# Patient Record
Sex: Female | Born: 1950 | Race: White | Hispanic: No | Marital: Married | Smoking: Former smoker
Health system: Southern US, Community
[De-identification: ages and names within clinical notes are randomized; demographics above are authoritative.]

## PROBLEM LIST (undated history)

## (undated) DIAGNOSIS — Z9841 Cataract extraction status, right eye: Secondary | ICD-10-CM

## (undated) DIAGNOSIS — R0600 Dyspnea, unspecified: Secondary | ICD-10-CM

## (undated) DIAGNOSIS — T8859XA Other complications of anesthesia, initial encounter: Secondary | ICD-10-CM

## (undated) DIAGNOSIS — Z9889 Other specified postprocedural states: Secondary | ICD-10-CM

## (undated) DIAGNOSIS — K469 Unspecified abdominal hernia without obstruction or gangrene: Secondary | ICD-10-CM

## (undated) DIAGNOSIS — R7303 Prediabetes: Secondary | ICD-10-CM

## (undated) DIAGNOSIS — C4490 Unspecified malignant neoplasm of skin, unspecified: Secondary | ICD-10-CM

## (undated) DIAGNOSIS — R1310 Dysphagia, unspecified: Secondary | ICD-10-CM

## (undated) DIAGNOSIS — S3669XA Other injury of rectum, initial encounter: Secondary | ICD-10-CM

## (undated) DIAGNOSIS — K219 Gastro-esophageal reflux disease without esophagitis: Secondary | ICD-10-CM

## (undated) DIAGNOSIS — R079 Chest pain, unspecified: Secondary | ICD-10-CM

## (undated) DIAGNOSIS — I8390 Asymptomatic varicose veins of unspecified lower extremity: Secondary | ICD-10-CM

## (undated) DIAGNOSIS — D3501 Benign neoplasm of right adrenal gland: Secondary | ICD-10-CM

## (undated) DIAGNOSIS — J45909 Unspecified asthma, uncomplicated: Secondary | ICD-10-CM

## (undated) DIAGNOSIS — J4489 Other specified chronic obstructive pulmonary disease: Secondary | ICD-10-CM

## (undated) DIAGNOSIS — Z8489 Family history of other specified conditions: Secondary | ICD-10-CM

## (undated) DIAGNOSIS — D649 Anemia, unspecified: Secondary | ICD-10-CM

## (undated) DIAGNOSIS — J449 Chronic obstructive pulmonary disease, unspecified: Secondary | ICD-10-CM

## (undated) DIAGNOSIS — R918 Other nonspecific abnormal finding of lung field: Secondary | ICD-10-CM

## (undated) DIAGNOSIS — E785 Hyperlipidemia, unspecified: Secondary | ICD-10-CM

## (undated) DIAGNOSIS — I10 Essential (primary) hypertension: Secondary | ICD-10-CM

## (undated) DIAGNOSIS — H25019 Cortical age-related cataract, unspecified eye: Secondary | ICD-10-CM

## (undated) DIAGNOSIS — N816 Rectocele: Secondary | ICD-10-CM

## (undated) DIAGNOSIS — I5032 Chronic diastolic (congestive) heart failure: Secondary | ICD-10-CM

## (undated) DIAGNOSIS — R011 Cardiac murmur, unspecified: Secondary | ICD-10-CM

## (undated) DIAGNOSIS — G4733 Obstructive sleep apnea (adult) (pediatric): Secondary | ICD-10-CM

## (undated) DIAGNOSIS — Z87891 Personal history of nicotine dependence: Secondary | ICD-10-CM

## (undated) DIAGNOSIS — T7840XA Allergy, unspecified, initial encounter: Secondary | ICD-10-CM

## (undated) DIAGNOSIS — M1990 Unspecified osteoarthritis, unspecified site: Secondary | ICD-10-CM

## (undated) DIAGNOSIS — R0609 Other forms of dyspnea: Secondary | ICD-10-CM

## (undated) DIAGNOSIS — I700 Atherosclerosis of aorta: Secondary | ICD-10-CM

## (undated) DIAGNOSIS — K649 Unspecified hemorrhoids: Secondary | ICD-10-CM

## (undated) HISTORY — PX: SPLENECTOMY: SUR1306

## (undated) HISTORY — PX: ARTHOSCOPIC ROTAOR CUFF REPAIR: SHX5002

## (undated) HISTORY — PX: HEMORRHOID SURGERY: SHX153

## (undated) HISTORY — PX: TUBAL LIGATION: SHX77

## (undated) HISTORY — PX: BLADDER REPAIR: SHX6721

## (undated) HISTORY — PX: CATARACT EXTRACTION: SUR2

## (undated) HISTORY — PX: HERNIA REPAIR: SHX51

## (undated) HISTORY — PX: ABDOMINAL HYSTERECTOMY: SHX81

## (undated) HISTORY — PX: LAPAROSCOPIC SPLENECTOMY: SUR796

## (undated) HISTORY — PX: SKIN CANCER EXCISION: SHX779

## (undated) HISTORY — DX: Chest pain, unspecified: R07.9

## (undated) HISTORY — PX: CHOLECYSTECTOMY: SHX55

## (undated) HISTORY — DX: Chronic diastolic (congestive) heart failure: I50.32

---

## 1981-12-09 DIAGNOSIS — Z862 Personal history of diseases of the blood and blood-forming organs and certain disorders involving the immune mechanism: Secondary | ICD-10-CM

## 1981-12-09 HISTORY — DX: Personal history of diseases of the blood and blood-forming organs and certain disorders involving the immune mechanism: Z86.2

## 2006-05-19 ENCOUNTER — Ambulatory Visit: Payer: Self-pay | Admitting: Unknown Physician Specialty

## 2006-11-14 ENCOUNTER — Ambulatory Visit: Payer: Self-pay | Admitting: Unknown Physician Specialty

## 2006-11-26 ENCOUNTER — Ambulatory Visit: Payer: Self-pay | Admitting: Unknown Physician Specialty

## 2006-12-08 ENCOUNTER — Ambulatory Visit: Payer: Self-pay | Admitting: Unknown Physician Specialty

## 2006-12-19 ENCOUNTER — Ambulatory Visit: Payer: Self-pay | Admitting: Vascular Surgery

## 2006-12-29 ENCOUNTER — Other Ambulatory Visit: Payer: Self-pay

## 2007-01-02 ENCOUNTER — Ambulatory Visit: Payer: Self-pay | Admitting: Vascular Surgery

## 2007-11-16 IMAGING — NM NUCLEAR MEDICINE HEPATOHBILIARY INCLUDE GB
2 series · 12 of 12 positions shown · non-contrast
Comparison: none

REASON FOR EXAM: Abdominl pain
COMMENTS:

[Series 1: gallbladder dynamic · 4.80mm/px · 6 of 60 frames shown]
[frame 6/60]
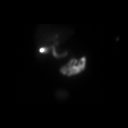
[frame 16/60]
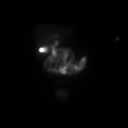
[frame 26/60]
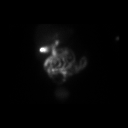
[frame 36/60]
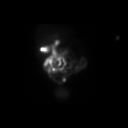
[frame 46/60]
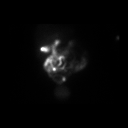
[frame 56/60]
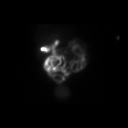

[Series 1: gallbladder dynamic (results) · 4.80mm/px · 6 of 60 frames shown]
[frame 6/60]
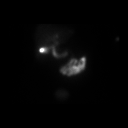
[frame 16/60]
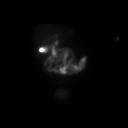
[frame 26/60]
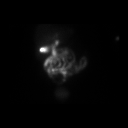
[frame 36/60]
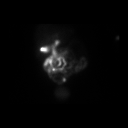
[frame 46/60]
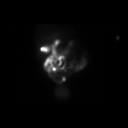
[frame 56/60]
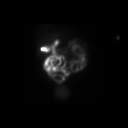

[12 of 12 positions shown; findings below may reference images not displayed]

PROCEDURE:     NM  - NM HEPATO WITH GB EJECT FRACTION  - December 19, 2006  [DATE]

RESULT:     Following intravenous administration of 7.63 millicuries of
technetium-99m Choletec, there is noted prompt visualization of tracer
activity in the liver at three minutes.  Tracer activity is visualized in
the gallbladder and common duct at 40 minutes.

The gallbladder ejection fraction at 30 minutes measures 29% which is in the
hypokinetic range.
IMPRESSION: Normal hepatobiliary scan.

The gallbladder ejection fraction at 30 minutes measures 29% which is in the
hypokinetic range.

## 2007-11-26 IMAGING — CR DG CHEST 2V
1 series · 2 of 2 positions shown · non-contrast
Comparison: none

REASON FOR EXAM: COPD
COMMENTS:

[Series 1: view not recorded · 0.17mm/px · 2 of 2 slices shown]
[im 1/2]
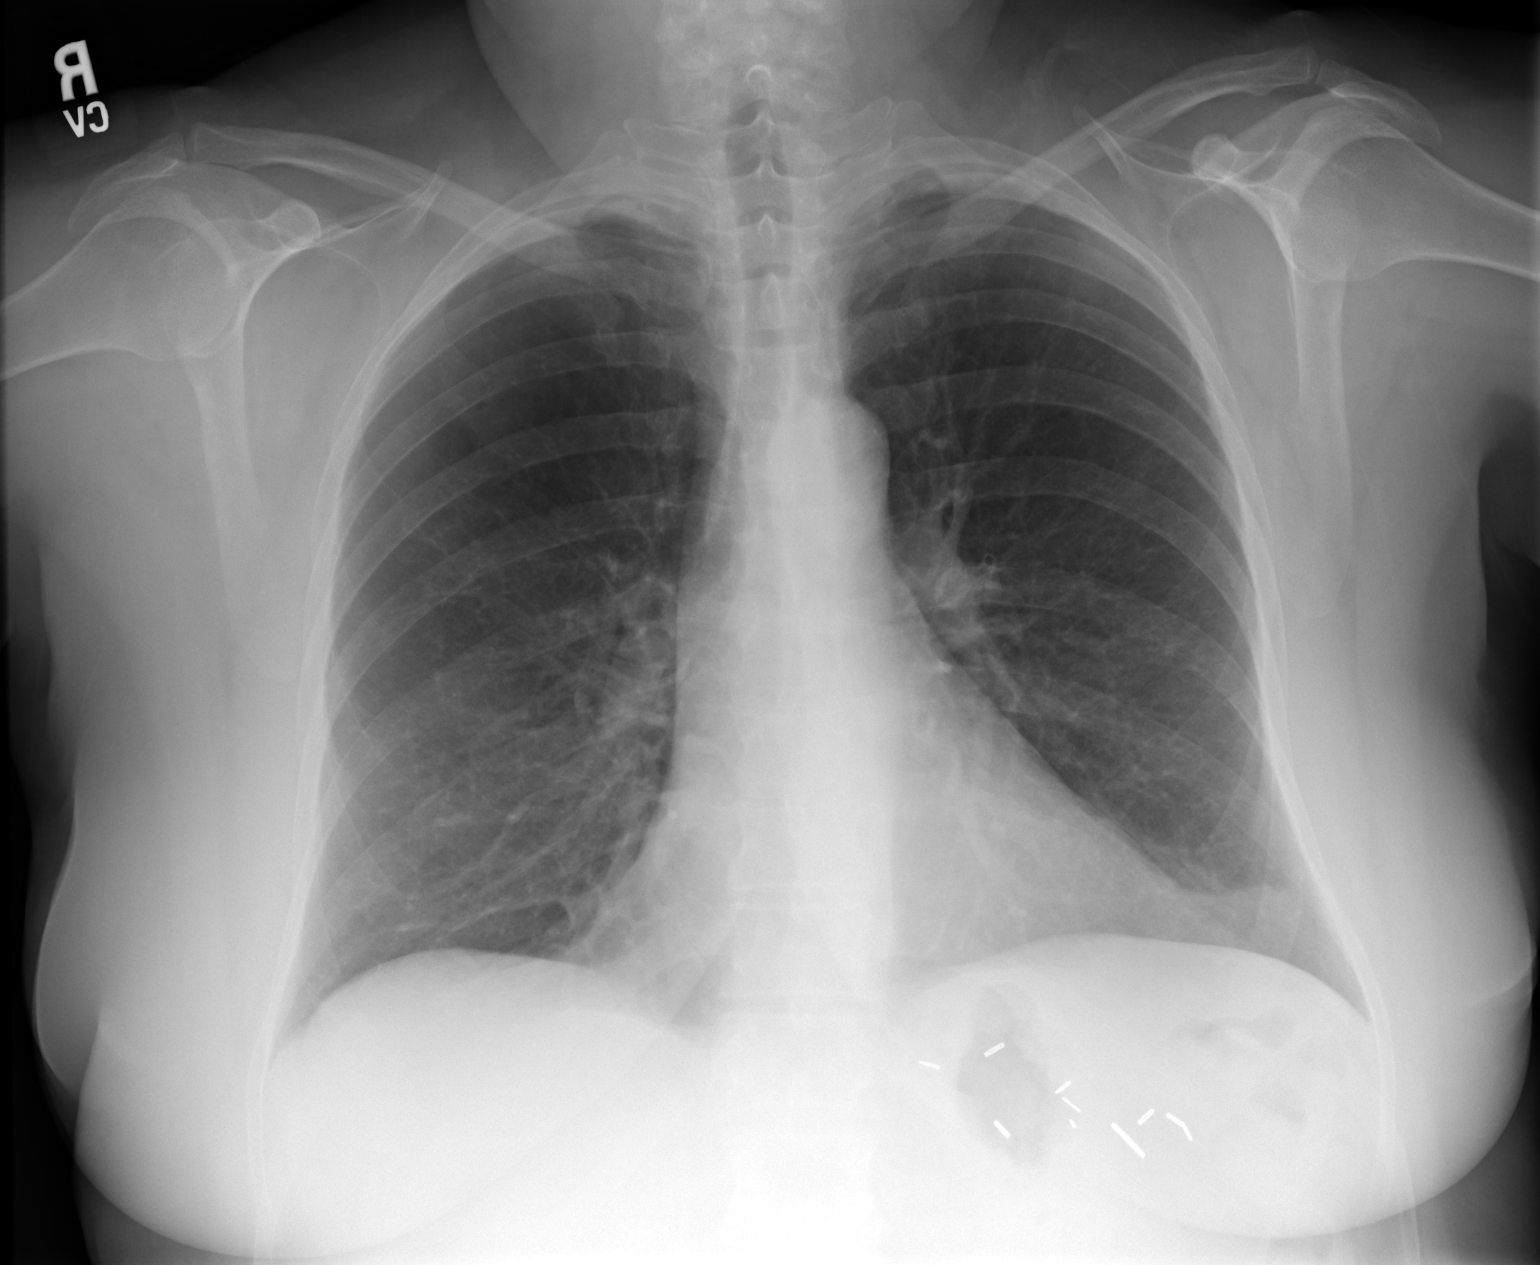
[im 2/2]
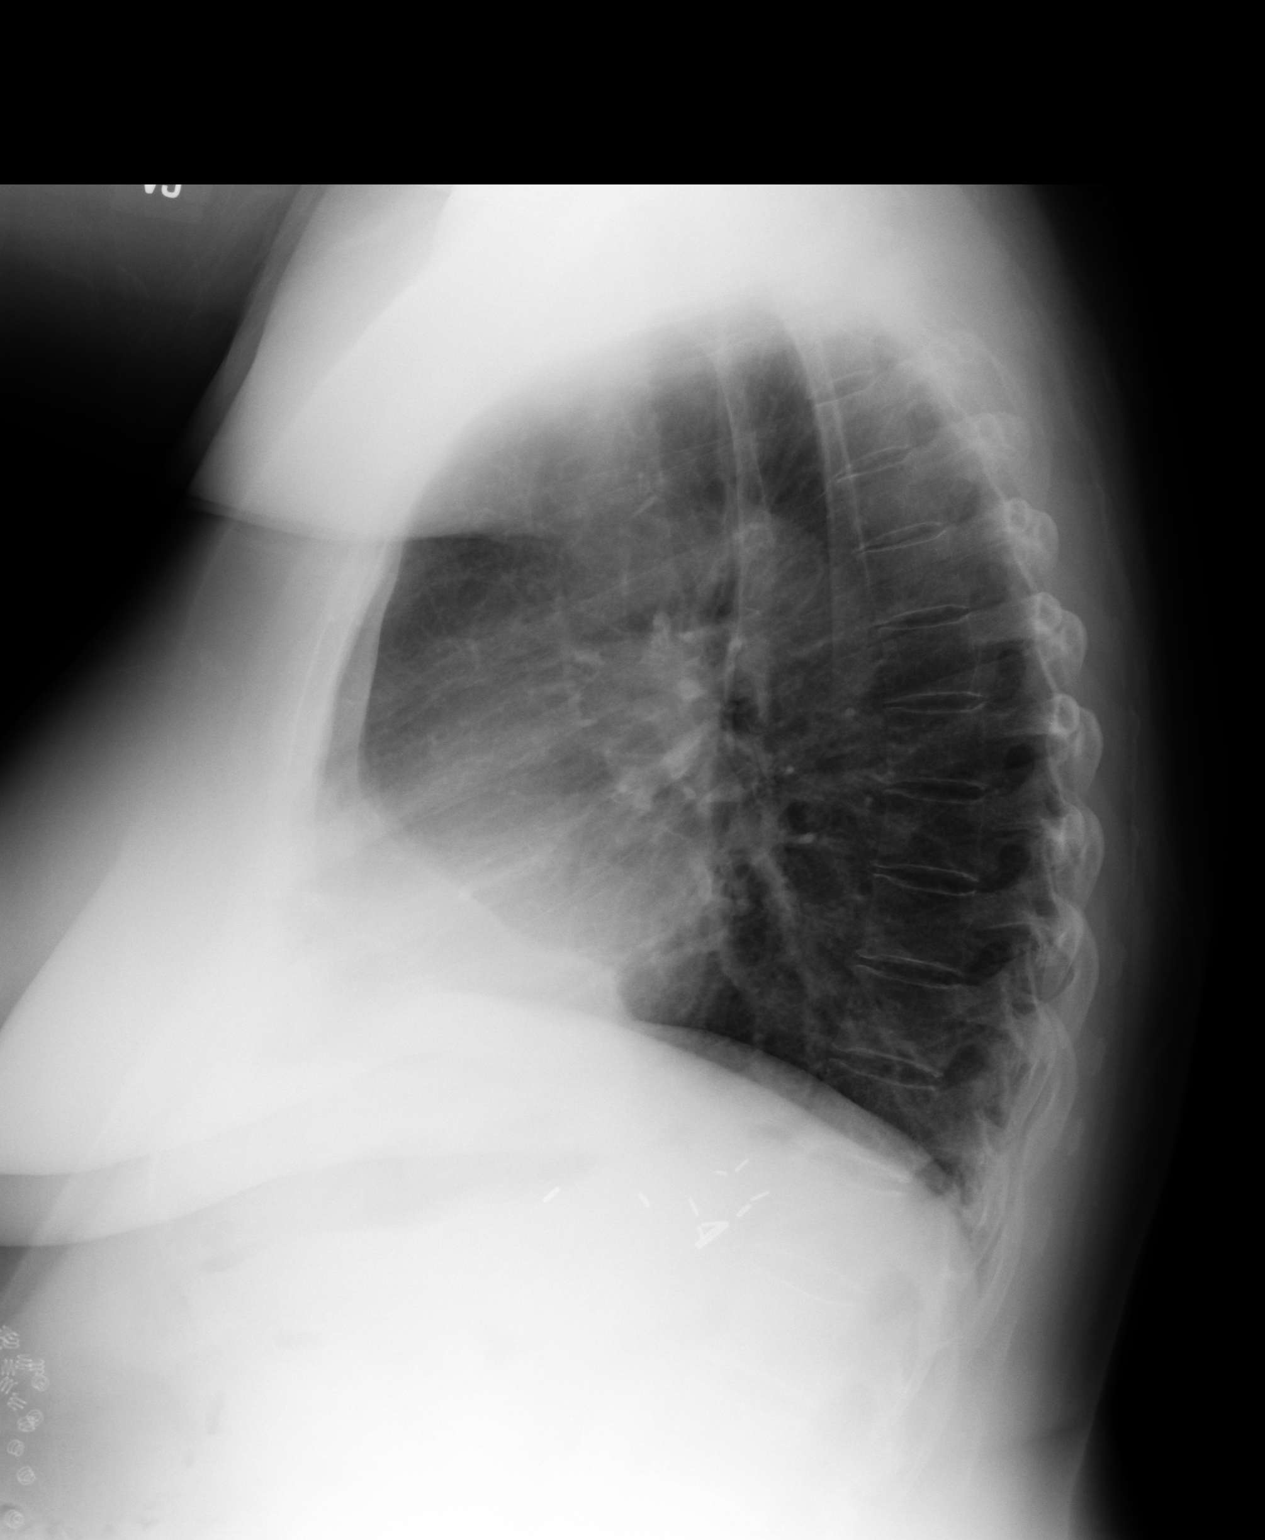

[2 of 2 positions shown; findings below may reference images not displayed]

PROCEDURE:     DXR - DXR CHEST PA (OR AP) AND LATERAL  - December 29, 2006 [DATE]

RESULT:     The lungs are mildly hyperinflated. There is no focal
infiltrate.  The heart is not enlarged.  The pulmonary vascularity is not
engorged.  There is no pleural effusion.  There are surgical clips in the
region of the GE junction.
IMPRESSION: I do not see evidence of pneumonia or congestive heart failure.  There are
findings which likely reflect an element of COPD or reactive airway disease.

## 2008-02-16 ENCOUNTER — Ambulatory Visit: Payer: Self-pay | Admitting: Unknown Physician Specialty

## 2008-09-30 ENCOUNTER — Ambulatory Visit: Payer: Self-pay | Admitting: Specialist

## 2008-10-11 ENCOUNTER — Ambulatory Visit: Payer: Self-pay | Admitting: Obstetrics and Gynecology

## 2008-10-17 ENCOUNTER — Observation Stay: Payer: Self-pay | Admitting: Vascular Surgery

## 2009-06-19 ENCOUNTER — Ambulatory Visit: Payer: Self-pay | Admitting: Specialist

## 2010-02-21 ENCOUNTER — Ambulatory Visit: Payer: Self-pay | Admitting: Specialist

## 2010-11-13 ENCOUNTER — Ambulatory Visit: Payer: Self-pay | Admitting: General Surgery

## 2010-11-16 ENCOUNTER — Ambulatory Visit: Payer: Self-pay | Admitting: General Surgery

## 2010-11-26 ENCOUNTER — Ambulatory Visit: Payer: Self-pay | Admitting: General Surgery

## 2011-08-30 ENCOUNTER — Emergency Department: Payer: Self-pay | Admitting: Emergency Medicine

## 2014-04-14 DIAGNOSIS — I10 Essential (primary) hypertension: Secondary | ICD-10-CM | POA: Insufficient documentation

## 2014-04-14 DIAGNOSIS — K219 Gastro-esophageal reflux disease without esophagitis: Secondary | ICD-10-CM | POA: Insufficient documentation

## 2014-04-14 DIAGNOSIS — J309 Allergic rhinitis, unspecified: Secondary | ICD-10-CM | POA: Insufficient documentation

## 2014-04-14 DIAGNOSIS — E785 Hyperlipidemia, unspecified: Secondary | ICD-10-CM | POA: Insufficient documentation

## 2015-04-27 DIAGNOSIS — Z862 Personal history of diseases of the blood and blood-forming organs and certain disorders involving the immune mechanism: Secondary | ICD-10-CM | POA: Insufficient documentation

## 2016-07-18 ENCOUNTER — Encounter: Payer: Self-pay | Admitting: *Deleted

## 2016-07-22 ENCOUNTER — Ambulatory Visit
Admission: RE | Admit: 2016-07-22 | Discharge: 2016-07-22 | Disposition: A | Discharge status: Home / Self Care | Payer: BLUE CROSS/BLUE SHIELD | Source: Ambulatory Visit | Attending: Unknown Physician Specialty | Admitting: Unknown Physician Specialty

## 2016-07-22 ENCOUNTER — Encounter
Admission: RE | Disposition: A | Discharge status: Home / Self Care | Payer: Self-pay | Source: Ambulatory Visit | Attending: Unknown Physician Specialty

## 2016-07-22 ENCOUNTER — Ambulatory Visit: Payer: BLUE CROSS/BLUE SHIELD | Admitting: Anesthesiology

## 2016-07-22 ENCOUNTER — Encounter: Payer: Self-pay | Admitting: Anesthesiology

## 2016-07-22 DIAGNOSIS — K5730 Diverticulosis of large intestine without perforation or abscess without bleeding: Secondary | ICD-10-CM | POA: Diagnosis not present

## 2016-07-22 DIAGNOSIS — M1990 Unspecified osteoarthritis, unspecified site: Secondary | ICD-10-CM | POA: Diagnosis not present

## 2016-07-22 DIAGNOSIS — K219 Gastro-esophageal reflux disease without esophagitis: Secondary | ICD-10-CM | POA: Diagnosis present

## 2016-07-22 DIAGNOSIS — Z9049 Acquired absence of other specified parts of digestive tract: Secondary | ICD-10-CM | POA: Diagnosis not present

## 2016-07-22 DIAGNOSIS — K640 First degree hemorrhoids: Secondary | ICD-10-CM | POA: Diagnosis not present

## 2016-07-22 DIAGNOSIS — I10 Essential (primary) hypertension: Secondary | ICD-10-CM | POA: Diagnosis not present

## 2016-07-22 DIAGNOSIS — K2950 Unspecified chronic gastritis without bleeding: Secondary | ICD-10-CM | POA: Diagnosis not present

## 2016-07-22 DIAGNOSIS — E785 Hyperlipidemia, unspecified: Secondary | ICD-10-CM | POA: Diagnosis not present

## 2016-07-22 DIAGNOSIS — Z79899 Other long term (current) drug therapy: Secondary | ICD-10-CM | POA: Insufficient documentation

## 2016-07-22 DIAGNOSIS — Z90710 Acquired absence of both cervix and uterus: Secondary | ICD-10-CM | POA: Insufficient documentation

## 2016-07-22 DIAGNOSIS — Z9889 Other specified postprocedural states: Secondary | ICD-10-CM | POA: Diagnosis not present

## 2016-07-22 DIAGNOSIS — K210 Gastro-esophageal reflux disease with esophagitis: Secondary | ICD-10-CM | POA: Insufficient documentation

## 2016-07-22 DIAGNOSIS — K2960 Other gastritis without bleeding: Secondary | ICD-10-CM | POA: Insufficient documentation

## 2016-07-22 DIAGNOSIS — R1310 Dysphagia, unspecified: Secondary | ICD-10-CM | POA: Insufficient documentation

## 2016-07-22 DIAGNOSIS — Z1211 Encounter for screening for malignant neoplasm of colon: Secondary | ICD-10-CM | POA: Diagnosis present

## 2016-07-22 DIAGNOSIS — Z7951 Long term (current) use of inhaled steroids: Secondary | ICD-10-CM | POA: Diagnosis not present

## 2016-07-22 DIAGNOSIS — Z87891 Personal history of nicotine dependence: Secondary | ICD-10-CM | POA: Insufficient documentation

## 2016-07-22 DIAGNOSIS — J449 Chronic obstructive pulmonary disease, unspecified: Secondary | ICD-10-CM | POA: Diagnosis not present

## 2016-07-22 HISTORY — DX: Hyperlipidemia, unspecified: E78.5

## 2016-07-22 HISTORY — DX: Essential (primary) hypertension: I10

## 2016-07-22 HISTORY — PX: ESOPHAGOGASTRODUODENOSCOPY (EGD) WITH PROPOFOL: SHX5813

## 2016-07-22 HISTORY — DX: Chronic obstructive pulmonary disease, unspecified: J44.9

## 2016-07-22 HISTORY — PX: COLONOSCOPY WITH PROPOFOL: SHX5780

## 2016-07-22 HISTORY — DX: Allergy, unspecified, initial encounter: T78.40XA

## 2016-07-22 HISTORY — DX: Dysphagia, unspecified: R13.10

## 2016-07-22 HISTORY — DX: Unspecified osteoarthritis, unspecified site: M19.90

## 2016-07-22 HISTORY — DX: Unspecified abdominal hernia without obstruction or gangrene: K46.9

## 2016-07-22 HISTORY — DX: Unspecified hemorrhoids: K64.9

## 2016-07-22 HISTORY — DX: Cortical age-related cataract, unspecified eye: H25.019

## 2016-07-22 HISTORY — DX: Unspecified asthma, uncomplicated: J45.909

## 2016-07-22 HISTORY — DX: Rectocele: N81.6

## 2016-07-22 HISTORY — DX: Gastro-esophageal reflux disease without esophagitis: K21.9

## 2016-07-22 HISTORY — DX: Other injury of rectum, initial encounter: S36.69XA

## 2016-07-22 HISTORY — DX: Anemia, unspecified: D64.9

## 2016-07-22 SURGERY — COLONOSCOPY WITH PROPOFOL
Anesthesia: General

## 2016-07-22 MED ORDER — PROPOFOL 10 MG/ML IV BOLUS
INTRAVENOUS | Status: DC | PRN
  Administered 2016-07-22: 50 mg via INTRAVENOUS

## 2016-07-22 MED ORDER — EPHEDRINE SULFATE 50 MG/ML IJ SOLN
INTRAMUSCULAR | Status: DC | PRN
  Administered 2016-07-22 (×2): 10 mg via INTRAVENOUS

## 2016-07-22 MED ORDER — PROPOFOL 500 MG/50ML IV EMUL
INTRAVENOUS | Status: DC | PRN
  Administered 2016-07-22: 100 ug/kg/min via INTRAVENOUS

## 2016-07-22 MED ORDER — LIDOCAINE HCL (PF) 2 % IJ SOLN
INTRAMUSCULAR | Status: DC | PRN
  Administered 2016-07-22: 50 mg

## 2016-07-22 MED ORDER — SODIUM CHLORIDE 0.9 % IV SOLN: INTRAVENOUS | Status: DC

## 2016-07-22 MED ORDER — GLYCOPYRROLATE 0.2 MG/ML IJ SOLN
INTRAMUSCULAR | Status: DC | PRN
  Administered 2016-07-22: 0.2 mg via INTRAVENOUS

## 2016-07-22 MED ORDER — SODIUM CHLORIDE 0.9 % IV SOLN
INTRAVENOUS | Status: DC
  Administered 2016-07-22: 1000 mL via INTRAVENOUS

## 2016-07-22 MED ORDER — MIDAZOLAM HCL 5 MG/5ML IJ SOLN
INTRAMUSCULAR | Status: DC | PRN
  Administered 2016-07-22 (×3): 1 mg via INTRAVENOUS

## 2016-07-22 MED ORDER — ONDANSETRON HCL 4 MG/2ML IJ SOLN: 4.0000 mg | Freq: Once | INTRAMUSCULAR | Status: DC | PRN

## 2016-07-22 MED ORDER — FENTANYL CITRATE (PF) 100 MCG/2ML IJ SOLN
25.0000 ug | INTRAMUSCULAR | Status: DC | PRN
  Administered 2016-07-22: 50 ug via INTRAVENOUS

## 2016-07-22 NOTE — Op Note (Signed)
[facility] Gastroenterology Patient Name: Rhonda Oconnell Procedure Date: 07/22/2016 9:04 AM MRN: [id number] Account #: [id number] Date of Birth: Sep 28, 1951 Admit Type: Outpatient Age: [age] [facility] Gender: Female Note Status: Finalized Procedure:            Colonoscopy Indications:          Screening for colorectal malignant neoplasm Providers:            Scot Junobert T. Elliott, MD Referring MD:         Hassell HalimMarcus E. Babaoff MD (Referring MD) Medicines:            Propofol per Anesthesia Complications:        No immediate complications. Procedure:            Pre-Anesthesia Assessment:                       - After reviewing the risks and benefits, the patient                        was deemed in satisfactory condition to undergo the                        procedure.                       After obtaining informed consent, the colonoscope was                        passed under direct vision. Throughout the procedure,                        the patient's blood pressure, pulse, and oxygen                        saturations were monitored continuously. The                        Colonoscope was introduced through the anus and                        advanced to the the cecum, identified by appendiceal                        orifice and ileocecal valve. The colonoscopy was                        somewhat difficult due to restricted mobility of the                        colon. Successful completion of the procedure was aided                        by straightening and shortening the scope to obtain                        bowel loop reduction. The patient tolerated the                        procedure well. The quality of the bowel preparation  was good. Findings:      Many small-mouthed diverticula were found in the sigmoid colon.      Internal hemorrhoids were found during endoscopy. The hemorrhoids were       small and Grade I (internal  hemorrhoids that do not prolapse).      The exam was otherwise without abnormality. Impression:           - Diverticulosis in the sigmoid colon.                       - Internal hemorrhoids.                       - The examination was otherwise normal.                       - No specimens collected. Recommendation:       - Repeat colonoscopy in 10 years for screening purposes. Procedure Code(s):    --- Professional ---                       [phone], Colonoscopy, flexible; diagnostic, including                        collection of specimen(s) by brushing or washing, when                        performed (separate procedure) Diagnosis Code(s):    --- Professional ---                       Z12.11, Encounter for screening for malignant neoplasm                        of colon                       K64.0, First degree hemorrhoids                       K57.30, Diverticulosis of large intestine without                        perforation or abscess without bleeding CPT copyright 2016 [employer]. All rights reserved. The codes documented in this report are preliminary and upon coder review may  be revised to meet current compliance requirements. Scot Junobert T Elliott, MD 07/22/2016 10:03:55 AM This report has been signed electronically. Number of Addenda: 0 Note Initiated On: 07/22/2016 9:04 AM Scope Withdrawal Time: 0 hours 7 minutes 3 seconds  Total Procedure Duration: 0 hours 16 minutes 23 seconds       [facility]

## 2016-07-22 NOTE — H&P (Signed)
Primary Care Physician:  Rozanna BoxBABAOFF, MARC E, MD Primary Gastroenterologist:  Dr. Mechele CollinElliott  Pre-Procedure History & Physical: HPI:  Rhonda Oconnell is a 65 y.o. female is here for an endoscopy and colonoscopy.   Past Medical History:  Diagnosis Date  . Abdominal hernia   . Allergic state   . Anemia   . Arthritis   . Asthma   . Cataract cortical, senile   . COPD (chronic obstructive pulmonary disease) (HCC)   . Dysphagia   . GERD (gastroesophageal reflux disease)   . Hemorrhoids   . Hyperlipidemia   . Hypertension   . Rectal tear (HCC)   . Rectocele     Past Surgical History:  Procedure Laterality Date  . ABDOMINAL HYSTERECTOMY    . ARTHOSCOPIC ROTAOR CUFF REPAIR    . BLADDER REPAIR    . CATARACT EXTRACTION    . CHOLECYSTECTOMY    . HEMORRHOID SURGERY    . HERNIA REPAIR    . LAPAROSCOPIC SPLENECTOMY    . TUBAL LIGATION      Prior to Admission medications   Medication Sig Start Date End Date Taking? Authorizing Provider  acetaminophen (TYLENOL) 500 MG tablet Take 500 mg by mouth every 6 (six) hours as needed.   Yes Historical Provider, MD  albuterol (PROVENTIL HFA;VENTOLIN HFA) 108 (90 Base) MCG/ACT inhaler Inhale 2 puffs into the lungs every 6 (six) hours as needed for wheezing or shortness of breath.   Yes Historical Provider, MD  atorvastatin (LIPITOR) 20 MG tablet Take 20 mg by mouth daily.   Yes Historical Provider, MD  beclomethasone (QVAR) 80 MCG/ACT inhaler Inhale 1 puff into the lungs 2 (two) times daily.   Yes Historical Provider, MD  Calcium Carbonate-Vitamin D (OSCAL 500/200 D-3 PO) Take 1 tablet by mouth 2 (two) times daily.   Yes Historical Provider, MD  hydrochlorothiazide (HYDRODIURIL) 12.5 MG tablet Take 12.5 mg by mouth daily.   Yes Historical Provider, MD  losartan (COZAAR) 100 MG tablet Take 100 mg by mouth daily.   Yes Historical Provider, MD  montelukast (SINGULAIR) 10 MG tablet Take 10 mg by mouth at bedtime.   Yes Historical Provider, MD  Multiple  Vitamin (MULTIVITAMIN) tablet Take 1 tablet by mouth daily.   Yes Historical Provider, MD  omeprazole (PRILOSEC) 20 MG capsule Take 20 mg by mouth 2 (two) times daily before a meal.   Yes Historical Provider, MD    Allergies as of 06/19/2016  . (Not on File)    History reviewed. No pertinent family history.  Social History   Social History  . Marital status: Married    Spouse name: N/A  . Number of children: N/A  . Years of education: N/A   Occupational History  . Not on file.   Social History Main Topics  . Smoking status: Former Smoker    Quit date: 08/15/1993  . Smokeless tobacco: Never Used  . Alcohol use No  . Drug use: No  . Sexual activity: Not on file   Other Topics Concern  . Not on file   Social History Narrative  . No narrative on file    Review of Systems: See HPI, otherwise negative ROS  Physical Exam: BP (!) 141/78   Pulse 75   Temp 97.6 F (36.4 C) (Tympanic)   Resp 18   Ht 5\' 3"  (1.6 m)   Wt 99.8 kg (220 lb)   SpO2 99%   BMI 38.97 kg/m  General:   Alert,  pleasant and  cooperative in NAD Head:  Normocephalic and atraumatic. Neck:  Supple; no masses or thyromegaly. Lungs:  Clear throughout to auscultation.    Heart:  Regular rate and rhythm. Abdomen:  Soft, nontender and nondistended. Normal bowel sounds, without guarding, and without rebound.   Neurologic:  Alert and  oriented x4;  grossly normal neurologically.  Impression/Plan: Rhonda Oconnell is here for an endoscopy and colonoscopy to be performed for dysphagia and GERD and colon screening.  Risks, benefits, limitations, and alternatives regarding  endoscopy and colonoscopy have been reviewed with the patient.  Questions have been answered.  All parties agreeable.   Lynnae PrudeELLIOTT, ROBERT, MD  07/22/2016, 9:21 AM

## 2016-07-22 NOTE — Op Note (Signed)
[facility] Gastroenterology Patient Name: Rhonda Oconnell Procedure Date: 07/22/2016 9:04 AM MRN: [id number] Account #: [id number] Date of Birth: 09-23-1951 Admit Type: Outpatient Age: [age] [facility] Gender: Female Note Status: Finalized Procedure:            Upper GI endoscopy Indications:          Dysphagia, Heartburn Providers:            Scot Junobert T. Tammela Bales, MD Referring MD:         Hassell HalimMarcus E. Babaoff MD (Referring MD) Medicines:            Propofol per Anesthesia Complications:        No immediate complications. Procedure:            Pre-Anesthesia Assessment:                       - After reviewing the risks and benefits, the patient                        was deemed in satisfactory condition to undergo the                        procedure.                       After obtaining informed consent, the endoscope was                        passed under direct vision. Throughout the procedure,                        the patient's blood pressure, pulse, and oxygen                        saturations were monitored continuously. The Endoscope                        was introduced through the mouth, and advanced to the                        second part of duodenum. The upper GI endoscopy was                        accomplished without difficulty. The patient tolerated                        the procedure well. Findings:      LA Grade A (one or more mucosal breaks less than 5 mm, not extending       between tops of 2 mucosal folds) esophagitis with no bleeding was found       39 cm from the incisors. Biopsies were taken with a cold forceps for       histology. A guidewire was placed and the scope was withdrawn. Dilation       was performed with a Savary dilator with mild resistance at 16 mm and 17       mm.      Diffuse and patchy mild inflammation characterized by erythema and       granularity was found in the gastric antrum. Biopsies were taken with a     cold forceps for  histology. Biopsies were taken with a cold forceps for       Helicobacter pylori testing.      The examined duodenum was normal. Impression:           - LA Grade A reflux esophagitis. Rule out Barrett's                        esophagus. Biopsied. Dilated.                       - Gastritis. Biopsied.                       - Normal examined duodenum. Recommendation:       - Await pathology results.                       - Perform a colonoscopy as previously scheduled. Scot Junobert T Taevion Sikora, MD 07/22/2016 9:42:43 AM This report has been signed electronically. Number of Addenda: 0 Note Initiated On: 07/22/2016 9:04 AM      [facility]

## 2016-07-22 NOTE — Anesthesia Postprocedure Evaluation (Signed)
Anesthesia Post Note  Patient: Wilmon Armsva L Regal  Procedure(s) Performed: Procedure(s) (LRB): COLONOSCOPY WITH PROPOFOL (N/A) ESOPHAGOGASTRODUODENOSCOPY (EGD) WITH PROPOFOL (N/A)  Patient location during evaluation: PACU Anesthesia Type: General Level of consciousness: awake and alert and oriented Pain management: pain level controlled Vital Signs Assessment: post-procedure vital signs reviewed and stable Respiratory status: spontaneous breathing Cardiovascular status: blood pressure returned to baseline Anesthetic complications: no    Last Vitals:  Vitals:   07/22/16 1020 07/22/16 1030  BP: (!) 97/54 115/69  Pulse:    Resp:    Temp:      Last Pain:  Vitals:   07/22/16 1006  TempSrc: Tympanic                 Krissia Schreier

## 2016-07-22 NOTE — Transfer of Care (Signed)
Immediate Anesthesia Transfer of Care Note  Patient: Rhonda Oconnell  Procedure(s) Performed: Procedure(s): COLONOSCOPY WITH PROPOFOL (N/A) ESOPHAGOGASTRODUODENOSCOPY (EGD) WITH PROPOFOL (N/A)  Patient Location: PACU  Anesthesia Type:General  Level of Consciousness: sedated  Airway & Oxygen Therapy: Patient Spontanous Breathing and Patient connected to nasal cannula oxygen  Post-op Assessment: Report given to RN and Post -op Vital signs reviewed and stable  Post vital signs: Reviewed and stable  Last Vitals:  Vitals:   07/22/16 0843  BP: (!) 141/78  Pulse: 75  Resp: 18  Temp: 36.4 C    Last Pain:  Vitals:   07/22/16 0843  TempSrc: Tympanic         Complications: No apparent anesthesia complications

## 2016-07-22 NOTE — Anesthesia Preprocedure Evaluation (Addendum)
Anesthesia Evaluation  Patient identified by MRN, date of birth, ID band Patient awake    Reviewed: Allergy & Precautions, NPO status , Patient's Chart, lab work & pertinent test results  Airway Mallampati: III  TM Distance: <3 FB     Dental  (+) Upper Dentures, Lower Dentures   Pulmonary asthma , COPD,  COPD inhaler, former smoker,    Pulmonary exam normal        Cardiovascular hypertension, Pt. on medications Normal cardiovascular exam     Neuro/Psych negative neurological ROS  negative psych ROS   GI/Hepatic Neg liver ROS, GERD  Medicated and Controlled,  Endo/Other  negative endocrine ROS  Renal/GU negative Renal ROS  negative genitourinary   Musculoskeletal  (+) Arthritis , Osteoarthritis,    Abdominal (+) + obese,   Peds negative pediatric ROS (+)  Hematology  (+) anemia ,   Anesthesia Other Findings   Reproductive/Obstetrics                            Anesthesia Physical Anesthesia Plan  ASA: III  Anesthesia Plan: General   Post-op Pain Management:    Induction: Intravenous  Airway Management Planned: Nasal Cannula  Additional Equipment:   Intra-op Plan:   Post-operative Plan:   Informed Consent: I have reviewed the patients History and Physical, chart, labs and discussed the procedure including the risks, benefits and alternatives for the proposed anesthesia with the patient or authorized representative who has indicated his/her understanding and acceptance.   Dental advisory given  Plan Discussed with: CRNA and Surgeon  Anesthesia Plan Comments:         Anesthesia Quick Evaluation

## 2016-07-23 ENCOUNTER — Encounter: Payer: Self-pay | Admitting: Unknown Physician Specialty

## 2016-07-24 LAB — SURGICAL PATHOLOGY

## 2016-09-03 ENCOUNTER — Other Ambulatory Visit: Payer: Self-pay | Admitting: Orthopedic Surgery

## 2016-09-03 DIAGNOSIS — M533 Sacrococcygeal disorders, not elsewhere classified: Secondary | ICD-10-CM

## 2016-09-09 ENCOUNTER — Ambulatory Visit: Payer: BLUE CROSS/BLUE SHIELD

## 2016-09-24 ENCOUNTER — Ambulatory Visit (INDEPENDENT_AMBULATORY_CARE_PROVIDER_SITE_OTHER): Payer: BLUE CROSS/BLUE SHIELD | Admitting: Vascular Surgery

## 2016-09-24 ENCOUNTER — Encounter (INDEPENDENT_AMBULATORY_CARE_PROVIDER_SITE_OTHER): Payer: Self-pay | Admitting: Vascular Surgery

## 2016-09-24 VITALS — BP 151/84 | HR 88 | Resp 17 | Ht 63.0 in | Wt 230.0 lb

## 2016-09-24 DIAGNOSIS — I10 Essential (primary) hypertension: Secondary | ICD-10-CM | POA: Diagnosis not present

## 2016-09-24 DIAGNOSIS — E785 Hyperlipidemia, unspecified: Secondary | ICD-10-CM

## 2016-09-24 DIAGNOSIS — I83813 Varicose veins of bilateral lower extremities with pain: Secondary | ICD-10-CM

## 2016-09-24 DIAGNOSIS — J449 Chronic obstructive pulmonary disease, unspecified: Secondary | ICD-10-CM

## 2016-09-24 NOTE — Patient Instructions (Signed)

## 2016-09-24 NOTE — Assessment & Plan Note (Signed)
blood pressure control important in reducing the progression of atherosclerotic disease. On appropriate oral medications.  

## 2016-09-24 NOTE — Assessment & Plan Note (Signed)
lipid control important in reducing the progression of atherosclerotic disease. Continue statin therapy  

## 2016-09-24 NOTE — Progress Notes (Signed)
Patient ID: Rhonda Oconnell, female   DOB: 1951/09/02, 65 y.o.   MRN: [id number]  Chief Complaint  Patient presents with  . New Patient (Initial Visit)    HPI Rhonda Oconnell is a 65 y.o. female..  The patient presents with complaints of symptomatic varicosities of the lower extremities. The patient reports a long standing history of varicosities and they have become painful over time. There was no clear inciting event or causative factor that started the symptoms.  The left leg is more severly affected but both legs are bothered by painful varicosities. The patient elevates the legs for relief. The pain is described as aching and stinging and is worse after she has sat for a long period of time. The symptoms are generally most severe in the evening, particularly when they have been on their feet for long periods of time. Compression stockings, elevation, and increased exercise has been used to try to improve the symptoms with limited success. The patient complains of occasional mild swelling as an associated symptom. The patient has no previous history of deep venous thrombosis or superficial thrombophlebitis to their knowledge.     Past Medical History:  Diagnosis Date  . Abdominal hernia   . Allergic state   . Anemia   . Arthritis   . Asthma   . Cataract cortical, senile   . COPD (chronic obstructive pulmonary disease) (HCC)   . Dysphagia   . GERD (gastroesophageal reflux disease)   . Hemorrhoids   . Hyperlipidemia   . Hypertension   . Rectal tear (HCC)   . Rectocele     Past Surgical History:  Procedure Laterality Date  . ABDOMINAL HYSTERECTOMY    . ARTHOSCOPIC ROTAOR CUFF REPAIR    . BLADDER REPAIR    . CATARACT EXTRACTION    . CHOLECYSTECTOMY    . COLONOSCOPY WITH PROPOFOL N/A 07/22/2016   Procedure: COLONOSCOPY WITH PROPOFOL;  Surgeon: Scot Jun, MD;  Location: [facility] ENDOSCOPY;  Service: Endoscopy;  Laterality: N/A;  . ESOPHAGOGASTRODUODENOSCOPY (EGD) WITH  PROPOFOL N/A 07/22/2016   Procedure: ESOPHAGOGASTRODUODENOSCOPY (EGD) WITH PROPOFOL;  Surgeon: Scot Jun, MD;  Location: [facility] ENDOSCOPY;  Service: Endoscopy;  Laterality: N/A;  . HEMORRHOID SURGERY    . HERNIA REPAIR    . LAPAROSCOPIC SPLENECTOMY    . TUBAL LIGATION      Family History  Problem Relation Age of Onset  . Varicose Veins Mother   . Cancer Father   . Varicose Veins Maternal Grandmother   No family history of bleeding disorders or clotting disorders  Social History Social History  Substance Use Topics  . Smoking status: Former Smoker    Quit date: 08/15/1993  . Smokeless tobacco: Never Used  . Alcohol use No  Retired. Married and lives with spouse  Allergies  Allergen Reactions  . Iodinated Diagnostic Agents Hives  . Iodine Hives  . Sulfa Antibiotics Hives    Current Outpatient Prescriptions  Medication Sig Dispense Refill  . acetaminophen (TYLENOL) 500 MG tablet Take 500 mg by mouth every 6 (six) hours as needed.    Marland Kitchen albuterol (PROVENTIL HFA;VENTOLIN HFA) 108 (90 Base) MCG/ACT inhaler Inhale 2 puffs into the lungs every 6 (six) hours as needed for wheezing or shortness of breath.    Marland Kitchen atorvastatin (LIPITOR) 20 MG tablet Take 20 mg by mouth daily.    Marland Kitchen atorvastatin (LIPITOR) 20 MG tablet Take by mouth.    . beclomethasone (QVAR) 80 MCG/ACT inhaler Inhale 1 puff into  the lungs 2 (two) times daily.    . Calcium Carbonate-Vitamin D (OSCAL 500/200 D-3 PO) Take 1 tablet by mouth 2 (two) times daily.    . chlorpheniramine (CHLOR-TRIMETON) 4 MG tablet Take 4 mg by mouth 2 (two) times daily as needed for allergies.    . hydrochlorothiazide (HYDRODIURIL) 12.5 MG tablet Take 12.5 mg by mouth daily.    Marland Kitchen losartan (COZAAR) 100 MG tablet Take 100 mg by mouth daily.    . montelukast (SINGULAIR) 10 MG tablet Take 10 mg by mouth at bedtime.    . Multiple Vitamin (MULTIVITAMIN) tablet Take 1 tablet by mouth daily.    Marland Kitchen omeprazole (PRILOSEC) 20 MG capsule Take 20 mg by mouth  2 (two) times daily before a meal.     No current facility-administered medications for this visit.       REVIEW OF SYSTEMS (Negative unless checked)  Constitutional: [] Weight loss  [] Fever  [] Chills Cardiac: [] Chest pain   [] Chest pressure   [] Palpitations   [] Shortness of breath when laying flat   [] Shortness of breath at rest   [] Shortness of breath with exertion. Vascular:  [] Pain in legs with walking   [] Pain in legs at rest   [] Pain in legs when laying flat   [] Claudication   [] Pain in feet when walking  [] Pain in feet at rest  [] Pain in feet when laying flat   [] History of DVT   [] Phlebitis   [x] Swelling in legs   [x] Varicose veins   [] Non-healing ulcers Pulmonary:   [] Uses home oxygen   [] Productive cough   [] Hemoptysis   [] Wheeze  [x] COPD   [] Asthma Neurologic:  [] Dizziness  [] Blackouts   [] Seizures   [] History of stroke   [] History of TIA  [] Aphasia   [] Temporary blindness   [] Dysphagia   [] Weakness or numbness in arms   [] Weakness or numbness in legs Musculoskeletal:  [] Arthritis   [] Joint swelling   [x] Joint pain   [] Low back pain Hematologic:  [] Easy bruising  [] Easy bleeding   [] Hypercoagulable state   [] Anemic  [] Hepatitis Gastrointestinal:  [] Blood in stool   [] Vomiting blood  [] Gastroesophageal reflux/heartburn   [] Abdominal pain Genitourinary:  [] Chronic kidney disease   [] Difficult urination  [] Frequent urination  [] Burning with urination   [] Hematuria Skin:  [] Rashes   [] Ulcers   [] Wounds Psychological:  [] History of anxiety   []  History of major depression.    Physical Exam BP (!) 151/84   Pulse 88   Resp 17   Ht 5\' 3"  (1.6 m)   Wt 230 lb (104.3 kg)   BMI 40.74 kg/m  Gen:  WD/WN, NAD Head: [place]/AT, No temporalis wasting.  Ear/Nose/Throat: Hearing grossly intact, dentition good Eyes: Sclera non-icteric. Conjunctiva clear Neck: Supple, no nuchal rigidity. Trachea midline Pulmonary:  Good air movement, no use of accessory muscles, respirations not labored.    Cardiac: RRR, No JVD Vascular: Varicosities diffuse and measuring up to 4-5 mm in the right lower extremity        Varicosities extensive and measuring up to 5 mm in the left lower extremity Vessel Right Left  Radial Palpable Palpable  Ulnar Palpable Palpable  Brachial Palpable Palpable  Carotid Palpable, without bruit Palpable, without bruit  Aorta Not palpable N/A  Femoral Palpable Palpable  Popliteal Palpable Palpable  PT Palpable Palpable  DP Palpable Palpable   Gastrointestinal: soft, non-tender/non-distended. No guarding/reflex. No masses, surgical incisions, or scars. Musculoskeletal: M/S 5/5 throughout.   1 + RLE edema.  Trace LLE edema Neurologic: CN  2-12 intact. Pain and light touch intact in extremities.  Symmetrical.  Speech is fluent.  Psychiatric: Judgment intact, Mood & affect appropriate for pt's clinical situation. Dermatologic: No rashes or ulcers noted.  No cellulitis or open wounds. Lymph : No Cervical, Axillary, or Inguinal lymphadenopathy.   Radiology No results found.  Labs Recent Results (from the past 2160 hour(s))  Surgical pathology     Status: None   Collection Time: 07/22/16  9:33 AM  Result Value Ref Range   SURGICAL PATHOLOGY      Surgical Pathology CASE: [id number] PATIENT: Cigi Hegstrom Surgical Pathology Report     SPECIMEN SUBMITTED: A. Stomach; cbx B. GEJ; cbx  CLINICAL HISTORY: None provided  PRE-OPERATIVE DIAGNOSIS: GERD, dysphagia, screening colonoscopy  POST-OPERATIVE DIAGNOSIS: Mild gastritis, esophagitis at GEJ, minimal diverticulosis, internal hemorrhoids     DIAGNOSIS: A. STOMACH; COLD BIOPSY: - OXYNTIC MUCOSA WITH EROSIVE GASTRITIS. - ANTRAL MUCOSA WITH MILD CHRONIC GASTRITIS. - NEGATIVE FOR H. PYLORI, DYSPLASIA, AND MALIGNANCY.  B. GEJ; COLD BIOPSY: - GASTRIC CARDIAC TYPE MUCOSA WITH MILD CHRONIC NONSPECIFIC GASTRITIS. - SQUAMOUS EPITHELIUM IS NOT PRESENT FOR EVALUATION. - NEGATIVE FOR DYSPLASIA AND  MALIGNANCY.   GROSS DESCRIPTION:  A. Labeled: stomach C BX  Tissue fragment(s): 3  Size: 0.3-0.4 cm  Description: pink  Entirely submitted in 1 cassette(s).   B. Labeled: GEJ C BX  Tissue fragment(s): 2  Size: 0.2-0.6 cm  Description: Jodie Echevariaan  Enti rely submitted in 1 cassette(s).    Final Diagnosis performed by Elijah Birkara Rubinas, MD.  Electronically signed 07/24/2016 1:32:26PM    The electronic signature indicates that the named Attending Pathologist has evaluated the specimen  Technical component performed at [facility], [address], [place], [place] [address] Lab: [phone] Dir: Titus DubinWilliam F. Cato MulliganHancock, MD  Professional component performed at [facility], [facility], [address], [place], [place] [address] Lab: [phone] Dir: Georgiann Cockerara C. Rubinas, MD      Assessment/Plan:  COPD (chronic obstructive pulmonary disease) (HCC) Stable. Not on oxygen.  Essential hypertension blood pressure control important in reducing the progression of atherosclerotic disease. On appropriate oral medications.   Hyperlipidemia lipid control important in reducing the progression of atherosclerotic disease. Continue statin therapy   Varicose veins of both lower extremities with pain See below    The patient has symptoms consistent with chronic venous insufficiency. We discussed the natural history and treatment options for venous disease. I recommended the regular use of 20 - 30 mm Hg compression stockings, and prescribed these today. I recommended leg elevation and anti-inflammatories as needed for pain. I have also recommended a complete venous duplex to assess the venous system for reflux or thrombotic issues. This can be done at the patient's convenience. I will see the patient back after their duplex to assess the response to conservative management, and determine further treatment options.     Festus BarrenJason Dew 09/24/2016, 2:13 PM   This note was created with  Dragon medical transcription system.  Any errors from dictation are unintentional.

## 2016-09-24 NOTE — Assessment & Plan Note (Signed)
See below

## 2016-09-24 NOTE — Assessment & Plan Note (Signed)
Stable. Not on oxygen.

## 2016-10-25 ENCOUNTER — Ambulatory Visit (INDEPENDENT_AMBULATORY_CARE_PROVIDER_SITE_OTHER): Payer: BLUE CROSS/BLUE SHIELD

## 2016-10-25 ENCOUNTER — Ambulatory Visit (INDEPENDENT_AMBULATORY_CARE_PROVIDER_SITE_OTHER): Payer: BLUE CROSS/BLUE SHIELD | Admitting: Vascular Surgery

## 2016-10-25 ENCOUNTER — Encounter (INDEPENDENT_AMBULATORY_CARE_PROVIDER_SITE_OTHER): Payer: Self-pay | Admitting: Vascular Surgery

## 2016-10-25 VITALS — BP 129/76 | HR 79 | Resp 16 | Ht 63.0 in | Wt 228.0 lb

## 2016-10-25 DIAGNOSIS — I83813 Varicose veins of bilateral lower extremities with pain: Secondary | ICD-10-CM

## 2016-10-25 DIAGNOSIS — I10 Essential (primary) hypertension: Secondary | ICD-10-CM

## 2016-10-25 DIAGNOSIS — E785 Hyperlipidemia, unspecified: Secondary | ICD-10-CM

## 2016-10-25 NOTE — Assessment & Plan Note (Signed)
blood pressure control important in reducing the progression of atherosclerotic disease. On appropriate oral medications.  

## 2016-10-25 NOTE — Progress Notes (Signed)
MRN : [id number]  Rhonda Oconnell is a 65 y.o. (1951-06-21) female who presents with chief complaint of  Chief Complaint  Patient presents with  . Re-evaluation    Ultrasound follow up  .  History of Present Illness: Patient returns today in follow up of her leg pain and varicose veins. She does not have fever, chills, or signs of systemic infection. The patient has had no improvement with compression stockings, leg elevation, and increasing her activity level. Her venous duplex today demonstrates no evidence of DVT or superficial thrombophlebitis. She had reflux in branches in the left thigh but not in the great saphenous vein itself. Her left lower extremity is the more severely affected leg. There is no superficial reflux in the right lower extremity, incidental finding of reflux in the deep venous system was seen  Past Medical History:  Diagnosis Date  . Abdominal hernia   . Allergic state   . Anemia   . Arthritis   . Asthma   . Cataract cortical, senile   . COPD (chronic obstructive pulmonary disease) (HCC)   . Dysphagia   . GERD (gastroesophageal reflux disease)   . Hemorrhoids   . Hyperlipidemia   . Hypertension   . Rectal tear (HCC)   . Rectocele          Past Surgical History:  Procedure Laterality Date  . ABDOMINAL HYSTERECTOMY    . ARTHOSCOPIC ROTAOR CUFF REPAIR    . BLADDER REPAIR    . CATARACT EXTRACTION    . CHOLECYSTECTOMY    . COLONOSCOPY WITH PROPOFOL N/A 07/22/2016   Procedure: COLONOSCOPY WITH PROPOFOL;  Surgeon: Scot Jun, MD;  Location: [facility] ENDOSCOPY;  Service: Endoscopy;  Laterality: N/A;  . ESOPHAGOGASTRODUODENOSCOPY (EGD) WITH PROPOFOL N/A 07/22/2016   Procedure: ESOPHAGOGASTRODUODENOSCOPY (EGD) WITH PROPOFOL;  Surgeon: Scot Jun, MD;  Location: [facility] ENDOSCOPY;  Service: Endoscopy;  Laterality: N/A;  . HEMORRHOID SURGERY    . HERNIA REPAIR    . LAPAROSCOPIC SPLENECTOMY    . TUBAL LIGATION            Family History  Problem Relation Age of Onset  . Varicose Veins Mother   . Cancer Father   . Varicose Veins Maternal Grandmother   No family history of bleeding disorders or clotting disorders  Social History      Social History  Substance Use Topics  . Smoking status: Former Smoker    Quit date: 08/15/1993  . Smokeless tobacco: Never Used  . Alcohol use No  Retired. Married and lives with spouse      Allergies  Allergen Reactions  . Iodinated Diagnostic Agents Hives  . Iodine Hives  . Sulfa Antibiotics Hives          Current Outpatient Prescriptions  Medication Sig Dispense Refill  . acetaminophen (TYLENOL) 500 MG tablet Take 500 mg by mouth every 6 (six) hours as needed.    Marland Kitchen albuterol (PROVENTIL HFA;VENTOLIN HFA) 108 (90 Base) MCG/ACT inhaler Inhale 2 puffs into the lungs every 6 (six) hours as needed for wheezing or shortness of breath.    Marland Kitchen atorvastatin (LIPITOR) 20 MG tablet Take 20 mg by mouth daily.    Marland Kitchen atorvastatin (LIPITOR) 20 MG tablet Take by mouth.    . beclomethasone (QVAR) 80 MCG/ACT inhaler Inhale 1 puff into the lungs 2 (two) times daily.    . Calcium Carbonate-Vitamin D (OSCAL 500/200 D-3 PO) Take 1 tablet by mouth 2 (two) times daily.    Marland Kitchen  chlorpheniramine (CHLOR-TRIMETON) 4 MG tablet Take 4 mg by mouth 2 (two) times daily as needed for allergies.    . hydrochlorothiazide (HYDRODIURIL) 12.5 MG tablet Take 12.5 mg by mouth daily.    Marland Kitchen. losartan (COZAAR) 100 MG tablet Take 100 mg by mouth daily.    . montelukast (SINGULAIR) 10 MG tablet Take 10 mg by mouth at bedtime.    . Multiple Vitamin (MULTIVITAMIN) tablet Take 1 tablet by mouth daily.    Marland Kitchen. omeprazole (PRILOSEC) 20 MG capsule Take 20 mg by mouth 2 (two) times daily before a meal.     No current facility-administered medications for this visit.       REVIEW OF SYSTEMS (Negative unless checked)  Constitutional: [] Weight loss  [] Fever   [] Chills Cardiac: [] Chest pain   [] Chest pressure   [] Palpitations   [] Shortness of breath when laying flat   [] Shortness of breath at rest   [] Shortness of breath with exertion. Vascular:  [] Pain in legs with walking   [] Pain in legs at rest   [] Pain in legs when laying flat   [] Claudication   [] Pain in feet when walking  [] Pain in feet at rest  [] Pain in feet when laying flat   [] History of DVT   [] Phlebitis   [x] Swelling in legs   [x] Varicose veins   [] Non-healing ulcers Pulmonary:   [] Uses home oxygen   [] Productive cough   [] Hemoptysis   [] Wheeze  [x] COPD   [] Asthma Neurologic:  [] Dizziness  [] Blackouts   [] Seizures   [] History of stroke   [] History of TIA  [] Aphasia   [] Temporary blindness   [] Dysphagia   [] Weakness or numbness in arms   [] Weakness or numbness in legs Musculoskeletal:  [] Arthritis   [] Joint swelling   [x] Joint pain   [] Low back pain Hematologic:  [] Easy bruising  [] Easy bleeding   [] Hypercoagulable state   [] Anemic  [] Hepatitis Gastrointestinal:  [] Blood in stool   [] Vomiting blood  [] Gastroesophageal reflux/heartburn   [] Abdominal pain Genitourinary:  [] Chronic kidney disease   [] Difficult urination  [] Frequent urination  [] Burning with urination   [] Hematuria Skin:  [] Rashes   [] Ulcers   [] Wounds Psychological:  [] History of anxiety   []  History of major depression.    Physical Exam BP (!) 151/84   Pulse 88   Resp 17   Ht 5\' 3"  (1.6 m)   Wt 230 lb (104.3 kg)   BMI 40.74 kg/m  Gen:  WD/WN, NAD Head: [place]/AT, No temporalis wasting.  Ear/Nose/Throat: Hearing grossly intact, dentition good Eyes: Sclera non-icteric. Conjunctiva clear Neck: Supple, no nuchal rigidity. Trachea midline Pulmonary:  Good air movement, no use of accessory muscles, respirations not labored.  Cardiac: RRR, No JVD Vascular: Varicosities diffuse and measuring up to 4-5 mm in the right lower extremity                   Varicosities extensive and measuring up to 5 mm in the left lower  extremity Vessel Right Left  Radial Palpable Palpable  Ulnar Palpable Palpable  Brachial Palpable Palpable  Carotid Palpable, without bruit Palpable, without bruit  Aorta Not palpable N/A  Femoral Palpable Palpable  Popliteal Palpable Palpable  PT Palpable Palpable  DP Palpable Palpable   Gastrointestinal: soft, non-tender/non-distended. No guarding/reflex. No masses, surgical incisions, or scars. Musculoskeletal: M/S 5/5 throughout.   1 + RLE edema.  Trace LLE edema Neurologic: CN 2-12 intact. Pain and light touch intact in extremities.  Symmetrical.  Speech is fluent.  Psychiatric: Judgment intact, Mood &  affect appropriate for pt's clinical situation. Dermatologic: No rashes or ulcers noted.  No cellulitis or open wounds. Lymph : No Cervical, Axillary, or Inguinal lymphadenopathy.   Labs No results found for this or any previous visit (from the past 2160 hour(s)).  Radiology No results found.    Assessment/Plan  Essential hypertension blood pressure control important in reducing the progression of atherosclerotic disease. On appropriate oral medications.   Hyperlipidemia lipid control important in reducing the progression of atherosclerotic disease. Continue statin therapy   Varicose veins of both lower extremities with pain The patient has had no improvement with compression stockings, leg elevation, and increasing her activity level. Her venous duplex today demonstrates no evidence of DVT or superficial thrombophlebitis. She had reflux in branches in the left thigh but not in the great saphenous vein itself. Her left lower extremity is the more severely affected leg. There is no superficial reflux in the right lower extremity, incidental finding of reflux in the deep venous system was seen. We have discussed laser ablation would not be the therapy of choice for her painful varicosities on her left leg. Sclerotherapy and foam sclerotherapy would likely provide an excellent  adjuvant therapy. Risks and benefits of sclerotherapy were discussed. She will continue her conservative therapy treatments as well.    Festus BarrenJason Karey Suthers, MD  10/25/2016 4:54 PM    This note was created with Dragon medical transcription system.  Any errors from dictation are purely unintentional

## 2016-10-25 NOTE — Patient Instructions (Signed)
Varicose Vein Surgery, Care After Refer to this sheet in the next few weeks. These instructions provide you with information about caring for yourself after your procedure. Your health care provider may also give you more specific instructions. Your treatment has been planned according to current medical practices, but problems sometimes occur. Call your health care provider if you have any problems or questions after your procedure. What can I expect after the procedure? After your procedure, it is typical to have the following:  Swelling.  Bruising.  Soreness.  Mild skin discoloration.  Slight bleeding at incision sites. Follow these instructions at home:  Take medicines only as directed by your health care provider.  Wear compression stockings as directed by your health care provider. These stockings help to prevent blood clots and reduce swelling in your legs.  There are many different ways to close and cover an incision, including stitches (sutures), skin glue, and adhesive strips. Follow your health care provider's instructions on:  Incision care.  Bandage (dressing) changes and removal.  Incision closure removal.  Wear loose-fitting clothing.  Get regular daily exercise. Walk or ride a stationary bike daily or as directed by your health care provider.  Ask your health care provider when you can return to work. This may depend on the type of work you do.  Be patient with your recovery. It can take up to 4 weeks to get back to your usual activities. Contact a health care provider if:  You have a fever.  You have drainage, redness, swelling, or pain at an incision site.  You develop a cough. Get help right away if:  You pass out.  You have very bad pain in your leg.  You have leg pain that gets worse when you walk.  You have redness or swelling in your leg that is getting worse.  You have trouble breathing.  You cough up blood. This information is not  intended to replace advice given to you by your health care provider. Make sure you discuss any questions you have with your health care provider. Document Released: 07/29/2014 Document Revised: 05/02/2016 Document Reviewed: 05/04/2014 Elsevier Interactive Patient Education  2017 [employer]  

## 2016-10-25 NOTE — Assessment & Plan Note (Signed)
lipid control important in reducing the progression of atherosclerotic disease. Continue statin therapy  

## 2016-10-25 NOTE — Assessment & Plan Note (Signed)
The patient has had no improvement with compression stockings, leg elevation, and increasing her activity level. Her venous duplex today demonstrates no evidence of DVT or superficial thrombophlebitis. She had reflux in branches in the left thigh but not in the great saphenous vein itself. Her left lower extremity is the more severely affected leg. There is no superficial reflux in the right lower extremity, incidental finding of reflux in the deep venous system was seen. We have discussed laser ablation would not be the therapy of choice for her painful varicosities on her left leg. Sclerotherapy and foam sclerotherapy would likely provide an excellent adjuvant therapy. Risks and benefits of sclerotherapy were discussed. She will continue her conservative therapy treatments as well.

## 2016-11-13 ENCOUNTER — Ambulatory Visit (INDEPENDENT_AMBULATORY_CARE_PROVIDER_SITE_OTHER): Payer: BLUE CROSS/BLUE SHIELD | Admitting: Vascular Surgery

## 2016-11-13 ENCOUNTER — Encounter (INDEPENDENT_AMBULATORY_CARE_PROVIDER_SITE_OTHER): Payer: BLUE CROSS/BLUE SHIELD

## 2018-09-10 ENCOUNTER — Other Ambulatory Visit: Payer: Self-pay | Admitting: Neurology

## 2018-09-10 DIAGNOSIS — R42 Dizziness and giddiness: Secondary | ICD-10-CM

## 2018-09-29 ENCOUNTER — Ambulatory Visit
Admission: RE | Admit: 2018-09-29 | Discharge: 2018-09-29 | Disposition: A | Discharge status: Home / Self Care | Payer: Medicare Other | Source: Ambulatory Visit | Attending: Neurology | Admitting: Neurology

## 2018-09-29 DIAGNOSIS — J019 Acute sinusitis, unspecified: Secondary | ICD-10-CM | POA: Insufficient documentation

## 2018-09-29 DIAGNOSIS — R42 Dizziness and giddiness: Secondary | ICD-10-CM | POA: Insufficient documentation

## 2018-10-05 ENCOUNTER — Ambulatory Visit: Payer: Medicare Other | Attending: Neurology

## 2018-10-05 DIAGNOSIS — R42 Dizziness and giddiness: Secondary | ICD-10-CM | POA: Diagnosis present

## 2018-10-05 DIAGNOSIS — R2681 Unsteadiness on feet: Secondary | ICD-10-CM | POA: Insufficient documentation

## 2018-10-05 NOTE — Therapy (Signed)
[facility] MAIN [facility] SERVICES [address] [place], [place], [address] Phone: [phone]   Fax:  [phone]  Physical Therapy Evaluation  Patient Details  Name: Rhonda Oconnell MRN: [id number] Date of Birth: 1951/05/25 Referring Provider (PT): Dr. Sherryll Burger   Encounter Date: 10/05/2018  PT End of Session - 10/06/18 0910    Visit Number  1    Number of Visits  9    Date for PT Re-Evaluation  11/30/18    Authorization Type  Progress note: 1/10, Last goals: 10/05/18    PT Start Time  0845    PT Stop Time  0945    PT Time Calculation (min)  60 min    Equipment Utilized During Treatment  Gait belt    Activity Tolerance  Patient tolerated treatment well    Behavior During Therapy  [facility] for tasks assessed/performed       Past Medical History:  Diagnosis Date  . Abdominal hernia   . Allergic state   . Anemia   . Arthritis   . Asthma   . Cataract cortical, senile   . COPD (chronic obstructive pulmonary disease) (HCC)   . Dysphagia   . GERD (gastroesophageal reflux disease)   . Hemorrhoids   . Hyperlipidemia   . Hypertension   . Rectal tear   . Rectocele     Past Surgical History:  Procedure Laterality Date  . ABDOMINAL HYSTERECTOMY    . ARTHOSCOPIC ROTAOR CUFF REPAIR    . BLADDER REPAIR    . CATARACT EXTRACTION    . CHOLECYSTECTOMY    . COLONOSCOPY WITH PROPOFOL N/A 07/22/2016   Procedure: COLONOSCOPY WITH PROPOFOL;  Surgeon: Scot Jun, MD;  Location: [facility] ENDOSCOPY;  Service: Endoscopy;  Laterality: N/A;  . ESOPHAGOGASTRODUODENOSCOPY (EGD) WITH PROPOFOL N/A 07/22/2016   Procedure: ESOPHAGOGASTRODUODENOSCOPY (EGD) WITH PROPOFOL;  Surgeon: Scot Jun, MD;  Location: [facility] ENDOSCOPY;  Service: Endoscopy;  Laterality: N/A;  . HEMORRHOID SURGERY    . HERNIA REPAIR    . LAPAROSCOPIC SPLENECTOMY    . TUBAL LIGATION      There were no vitals filed for this visit.   Subjective Assessment - 10/05/18 0936    Subjective   Dizziness/unsteadiness    Pertinent History  Pt states that on Apr 27, 2018 she woke up and tried to go to the bathroom and almost fell on her face. Pt states that the severe symptoms lasted for a couple days and it gradually improved but never fully resolved. In June she had another bout and she went to see her PCP who prescribed her meclizine. She does not believe that she was provided a steroid at that time but does not recall. She was referred to [facility] ENT who performed a VNG study as well as an audiogram. Pt states that everything was negative and her hearing is excellent. She was referred to neurology who ordered an MRI which was negative with the exception of increased fluid in her sinuses. She saw Joanie Coddington PA-C who prescribed her amoxicillin and prednisone for a presumed sinus infection. She reports that her sinus pressure has improved significantly since starting these medications. However she reports that the morning of 09/23/18 she woke up and had severe dizziness again. She states that her symptoms lasted for a whole week. She had the same problem in 1997 and it eventually self-resolved. She has been taking two Excedrin migraine in the morning since starting the Prednisone because it causes headaches for her. Around  the time the dizziness started in May 2019 she also started to get headaches. Her headaches are bilateral and start in the occipital region and then spread to the top of her head. Pt states that she gets blurring of her vision during these episodes. She has been diagnosed with ocular migraines and she typically experiences narrowing of her vision even to the point of complete visual loss. She had 3 to 4 ocular migraines around the time the dizziness started in May and she reports that prior to that it had been a long time since she had a migraine. Pt reports 2 ocular migraines in the last 2 weeks.     Diagnostic tests  MRI WNL except for fluid in her sinuses    Patient Stated  Goals  "I want to move better" "get rid of dizziness" "to be able to take care of grandchildren and great grandchildren"    Currently in Pain?  --   Not related to current episode        SUBJECTIVE Chief complaint: Pt states that on Apr 27, 2018 she woke up and tried to go to the bathroom and almost fell on her face. Pt states that the severe symptoms lasted for a couple days and it gradually improved but never fully resolved. In June she had another bout and she went to see her PCP who prescribed her meclizine. She does not believe that she was provided a steroid at that time but does not recall. She was referred to [facility]NT who performed a VNG study as well as an audiogram. Pt states that everything was negative and her hearing is excellent. She was referred to neurology who ordered an MRI which was negative with the exception of increased fluid in her sinuses. She saw Joanie Coddington PA-C who prescribed her amoxicillin and prednisone for a presumed sinus infection. She reports that her sinus pressure has improved significantly since starting these medications. However she reports that the morning of 09/23/18 she woke up and had severe dizziness again. She states that her symptoms lasted for a whole week. She had the same problem in 1997 and it eventually self-resolved. She has been taking two Excedrin migraine in the morning since starting the Prednisone because it causes headaches for her. Around the time the dizziness started in May 2019 she also started to get headaches. Her headaches are bilateral and start in the occipital region and then spread to the top of her head. Pt states that she gets blurring of her vision during these episodes. She has been diagnosed with ocular migraines and she typically experiences narrowing of her vision even to the point of complete visual loss. She had 3 to 4 ocular migraines around the time the dizziness started in May and she reports that prior to that it had  been a long time since she had a migraine. Pt reports 2 ocular migraines in the last 2 weeks.  Onset: 04/27/18 Imaging: MRI WNL except for fluid in her sinuses Recent changes in overall health/medication: No Directional pattern for falls: No falls Prior history of physical therapy for balance: None Follow-up appointment with MD: Neurology in January 2020 Red flags (bowel/bladder changes, saddle paresthesia, personal history of cancer, chills/fever, night sweats, unrelenting pain, neck pain, low back pain, dysphagia, dysarthria) Negative Description of symptoms: Dizziness and unsteadiness. Denies vertigo Aggravating: Rolling over in bed during an episode (off balance, no vertigo), otherwise no known aggravating factors; Easing: no help from Meclizine, no other known easing factors;  Duration: initial episode lasted for a full week, since that time episodes have lasted a few hours Auditory: "squishy sound in L ear, constant", denies fullness, hearing loss, tinnitus Visual: Blurring during episodes, denies floaters Numbness/tingling: Denies   OBJECTIVE  MUSCULOSKELETAL: Tremor: Absent Bulk: Normal Tone: Normal, no clonus  Posture No gross abnormalities noted in standing or seated posture  Gait No gross abnormalities in gait noted  Strength R/L 5/5 Shoulder flexion 5/5 Shoulder abduction 5/5 Shoulder shrug 5/5 Elbow flex and extension Grip strength symmetrical 5/5 Hip flexion 5/5 Knee extension 5/5 Knee flexion 5/5 Ankle Dorsiflexion   NEUROLOGICAL:  Mental Status Patient is oriented to person, place and time.  Recent memory is intact.  Remote memory is intact.  Attention span and concentration are intact.  Expressive speech is intact.  Patient's fund of knowledge is within normal limits for educational level.  Cranial Nerves Extraocular muscles are intact  Facial sensation is intact bilaterally  Facial strength is intact bilaterally  Hearing is normal as tested by  gross conversation Palate elevates midline, normal phonation  Shoulder shrug strength is intact   Sensation Grossly intact to light touch bilateral UEs/LEs as determined by testing dermatomes C2-T2/L2-S2 respectively Proprioception and hot/cold testing deferred on this date  Reflexes Deferred on this date   Coordination/Cerebellar Finger to Nose: WNL Heel to Shin: WNL Rapid alternating movements: WNL Finger Opposition: WNL Pronator Drift: Positive on R side for pronation but no downward drift   FUNCTIONAL OUTCOME MEASURES   Results Comments  BERG 52/56 Fall risk, in need of intervention  DGI 17/24 Fall risk, in need of intervention  FGA    TUG    5TSTS    10 Meter Gait Speed    ABC Scale 45% Low balance confidence  DHI 68/100 High perception of handicap  6 Minute Walk Test      POSTURAL CONTROL TESTS   Clinical Test of Sensory Interaction for Balance    (CTSIB):  CONDITION TIME STRATEGY SWAY  Eyes open, firm surface 30 seconds ankle 1+  Eyes closed, firm surface 7.6 seconds ankle 4+  Eyes open, foam surface 30 seconds ankle 3+  Eyes closed, foam surface 18.5 seconds ankle 4+    OCULOMOTOR / VESTIBULAR TESTING:  Oculomotor Exam- Room Light  Findings Comments  Ocular Alignment normal   Ocular ROM normal   Spontaneous Nystagmus normal   End-Gaze Nystagmus normal   Smooth Pursuit abnormal Saccadic, reproduces some dizziness  Saccades normal   VOR Abnormal Blurring of target  VOR Cancellation abnormal Blurring of target  Left Head Thrust normal   Right Head Thrust normal   Head Shaking Nystagmus not examined   Static Acuity not examined   Dynamic Acuity not examined     Oculomotor Exam- Fixation Suppressed: Deferred on this date  BPPV TESTS:  Symptoms Duration Intensity Nystagmus  L Dix-Hallpike None   None  R Dix-Hallpike None   None  L Head Roll      R Head Roll      L Sidelying Test      R Sidelying Test                           [facility] PT Assessment - 10/05/18 0919      Assessment   Medical Diagnosis  Dizziness    Referring Provider (PT)  Dr. Sherryll Burger    Onset Date/Surgical Date  04/27/18    Hand Dominance  Right  Next MD Visit  January 2020 with neurology    Prior Therapy  None      Precautions   Precautions  Fall      Restrictions   Weight Bearing Restrictions  No      Balance Screen   Has the patient fallen in the past 6 months  No    Has the patient had a decrease in activity level because of a fear of falling?   Yes    Is the patient reluctant to leave their home because of a fear of falling?   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      Cognition   Overall Cognitive Status  Within Functional Limits for tasks assessed      Observation/Other Assessments   Other Surveys   Other Surveys    Activities of Balance Confidence Scale (ABC Scale)   45%    Dizziness Handicap Inventory (DHI)   68/100      Standardized Balance Assessment   Standardized Balance Assessment  Berg Balance Test;Dynamic Gait Index      Berg Balance Test   Sit to Stand  Able to stand without using hands and stabilize independently    Standing Unsupported  Able to stand safely 2 minutes    Sitting with Back Unsupported but Feet Supported on Floor or Stool  Able to sit safely and securely 2 minutes    Stand to Sit  Sits safely with minimal use of hands    Transfers  Able to transfer safely, minor use of hands    Standing Unsupported with Eyes Closed  Able to  stand 10 seconds safely    Standing Ubsupported with Feet Together  Able to place feet together independently and stand 1 minute safely    From Standing, Reach Forward with Outstretched Arm  Can reach forward >12 cm safely (5")    From Standing Position, Pick up Object from Floor  Able to pick up shoe safely and easily    From Standing Position, Turn to Look Behind Over each Shoulder  Looks behind from both sides and weight shifts well    Turn 360 Degrees  Able to turn 360 degrees safely in 4 seconds or less    Standing Unsupported, Alternately Place Feet on Step/Stool  Able to stand independently and safely and complete 8 steps in 20 seconds    Standing Unsupported, One Foot in Front  Able to plae foot ahead of the other independently and hold 30 seconds    Standing on One Leg  Able to lift leg independently and hold equal to or more than 3 seconds    Total Score  52      Dynamic Gait Index   Level Surface  Mild Impairment    Change in Gait Speed  Mild Impairment    Gait with Horizontal Head Turns  Mild Impairment  Gait with Vertical Head Turns  Moderate Impairment    Gait and Pivot Turn  Mild Impairment    Step Over Obstacle  Normal    Step Around Obstacles  Normal    Steps  Mild Impairment    Total Score  17                Objective measurements completed on examination: See above findings.              PT Education - 10/06/18 0910    Education Details  Plan of care and goals    Person(s) Educated  Patient    Methods  Explanation    Comprehension  Verbalized understanding       PT Short Term Goals - 10/06/18 0915      PT SHORT TERM GOAL #1   Title  Pt will be independent with HEP in order to improve strength, balance, and dizziness in order to decrease fall risk and improve function at home.    Time  4    Period  Weeks    Status  New    Target Date  11/02/18        PT Long Term Goals - 10/06/18 0916      PT LONG TERM GOAL #1   Title  Pt will  improve BERG by at least 3 points in order to demonstrate clinically significant improvement in balance.      Baseline  10/05/18: 52/56    Time  8    Period  Weeks    Status  New    Target Date  11/30/18      PT LONG TERM GOAL #2   Title  Pt will improve DGI by at least 3 points in order to demonstrate clinically significant improvement in balance and decreased risk for falls.    Baseline  10/05/18: 17/24    Time  8    Period  Weeks    Status  New    Target Date  11/30/18      PT LONG TERM GOAL #3   Title  Pt will improve ABC by at least 13% in order to demonstrate clinically significant improvement in balance confidence.     Baseline  10/05/18: 45%    Time  8    Period  Weeks    Status  New    Target Date  11/30/18      PT LONG TERM GOAL #4   Title  Pt will decrease DHI score by at least 18 points in order to demonstrate clinically significant reduction in disability     Baseline  10/05/18: 68/100    Time  8    Period  Weeks    Status  New    Target Date  11/30/18             Plan - 10/06/18 0912    Clinical Impression Statement  Pt is a pleasant 67 year-old female referred by neurology for dizziness. She states that on Apr 27, 2018 she woke up and tried to go to the bathroom and almost fell on her face. Pt states that the severe symptoms lasted for a couple days and gradually improved but never fully resolved. She was referred to [facility] ENT who performed a VNG study as well as an audiogram. Pt states that everything was negative and her hearing is excellent. On 09/23/18 she woke up and had severe dizziness again. She states that her symptoms lasted for a whole  week. She was referred to neurology who ordered an MRI which was negative with the exception of increased fluid in her sinuses. She is currently on a steroid taper along with amoxicillin for presumed sinus infection. She has a history of ocular migraines and she typically experiences narrowing of her vision even to  the point of complete visual loss sometimes. She had 3 to 4 ocular migraines around the time the dizziness started in May 2019 and she reports that prior to that it had been a long time since she had a migraine. Pt reports 2 ocular migraines in the last 2 weeks. PT examination reveals deficits in balance with BERG score of 52/56 and DGI of 17/24. She has low balance confidence with ABC scale score of 45% and significant self-reported handicap with DHI of 68/100. mCTSIB is abnormal with falls during eyes closed balance on both firm and foam (conditions 2 and 4). Oculomotor testing is abnormal for slow VOR, VOR cancellation, and saccadic smooth pursuits. Dix-Hallpike testing is negative and history does not support BPPV as pt denies any true vertigo. ENT has already ruled out peripheral causes of symptoms. In light of ocular migraines which have increased in frequency and restarted around the time her symptoms began in May, vestibular migraines should be considered as part of her differential. She also presents with deficits in gait and balance and will benefit from skilled PT services to address these deficits in order to decrease her risk for future falls and improve her function.     History and Personal Factors relevant to plan of care:  Moderate (evolving): 1-2 personal factors/comorbidities, 3 or more body systems/activity limitations/participation restrictions      Clinical Presentation  Unstable    Clinical Decision Making  Moderate    Rehab Potential  Good    PT Frequency  1x / week    PT Duration  8 weeks    PT Treatment/Interventions  Electrical Stimulation;ADLs/Self Care Home Management;Aquatic Therapy;Biofeedback;Canalith Repostioning;Cryotherapy;Iontophoresis 4mg /ml Dexamethasone;Moist Heat;Traction;Ultrasound;DME Instruction;Gait training;Stair training;Functional mobility training;Therapeutic exercise;Balance training;Neuromuscular re-education;Patient/family education;Manual techniques;Passive  range of motion;Dry needling;Vestibular    PT Next Visit Plan  DVA, 73m gait speed, 5TSTS, initiate HEP (VOR x 1 horizontal, semitandem balance with head turns)    PT Home Exercise Plan  None currently    Consulted and Agree with Plan of Care  Patient       Patient will benefit from skilled therapeutic intervention in order to improve the following deficits and impairments:  Abnormal gait, Decreased balance, Dizziness, Difficulty walking  Visit Diagnosis: Dizziness and giddiness - Plan: PT plan of care cert/re-cert  Unsteadiness on feet - Plan: PT plan of care cert/re-cert     Problem List Patient Active Problem List   Diagnosis Date Noted  . COPD (chronic obstructive pulmonary disease) (HCC) 09/24/2016  . Essential hypertension 09/24/2016  . Hyperlipidemia 09/24/2016  . Varicose veins of both lower extremities with pain 09/24/2016   Lynnea Maizes PT, DPT, GCS  Destiney Sanabia 10/06/2018, 10:27 AM  [facility] MAIN [facility] SERVICES [address] [place], [place], [address] Phone: [phone]   Fax:  [phone]  Name: Rhonda Oconnell MRN: [id number] Date of Birth: [birth year]

## 2018-10-12 ENCOUNTER — Ambulatory Visit: Payer: Medicare Other | Attending: Neurology

## 2018-10-12 VITALS — BP 106/49 | HR 76

## 2018-10-12 DIAGNOSIS — R42 Dizziness and giddiness: Secondary | ICD-10-CM | POA: Insufficient documentation

## 2018-10-12 DIAGNOSIS — R2681 Unsteadiness on feet: Secondary | ICD-10-CM | POA: Diagnosis present

## 2018-10-12 NOTE — Therapy (Signed)
[facility] MAIN [facility] SERVICES [address] [place], [place], [address] Phone: [phone]   Fax:  [phone]  Physical Therapy Treatment  Patient Details  Name: Rhonda Oconnell MRN: [id number] Date of Birth: 1951-04-30 Referring Provider (PT): Dr. Sherryll Burger   Encounter Date: 10/12/2018  PT End of Session - 10/12/18 1521    Visit Number  2    Number of Visits  9    Date for PT Re-Evaluation  11/30/18    Authorization Type  Progress note: 2/10, Last goals: 10/05/18    PT Start Time  1515    PT Stop Time  1600    PT Time Calculation (min)  45 min    Equipment Utilized During Treatment  Gait belt    Activity Tolerance  Patient tolerated treatment well    Behavior During Therapy  [facility] for tasks assessed/performed       Past Medical History:  Diagnosis Date  . Abdominal hernia   . Allergic state   . Anemia   . Arthritis   . Asthma   . Cataract cortical, senile   . COPD (chronic obstructive pulmonary disease) (HCC)   . Dysphagia   . GERD (gastroesophageal reflux disease)   . Hemorrhoids   . Hyperlipidemia   . Hypertension   . Rectal tear   . Rectocele     Past Surgical History:  Procedure Laterality Date  . ABDOMINAL HYSTERECTOMY    . ARTHOSCOPIC ROTAOR CUFF REPAIR    . BLADDER REPAIR    . CATARACT EXTRACTION    . CHOLECYSTECTOMY    . COLONOSCOPY WITH PROPOFOL N/A 07/22/2016   Procedure: COLONOSCOPY WITH PROPOFOL;  Surgeon: Scot Jun, MD;  Location: [facility] ENDOSCOPY;  Service: Endoscopy;  Laterality: N/A;  . ESOPHAGOGASTRODUODENOSCOPY (EGD) WITH PROPOFOL N/A 07/22/2016   Procedure: ESOPHAGOGASTRODUODENOSCOPY (EGD) WITH PROPOFOL;  Surgeon: Scot Jun, MD;  Location: [facility] ENDOSCOPY;  Service: Endoscopy;  Laterality: N/A;  . HEMORRHOID SURGERY    . HERNIA REPAIR    . LAPAROSCOPIC SPLENECTOMY    . TUBAL LIGATION      Vitals:   10/12/18 1541  BP: (!) 106/49  Pulse: 76    Subjective Assessment - 10/12/18 1519     Subjective  Pt reports that she is dong well today but she feels a little "spacey" due to finishing her antibiotic and prednisone. She states that she has started having more dizziness since ending her medications. She reports some lightheadedness with coughing today. Denies pain    Pertinent History  Pt states that on Apr 27, 2018 she woke up and tried to go to the bathroom and almost fell on her face. Pt states that the severe symptoms lasted for a couple days and it gradually improved but never fully resolved. In June she had another bout and she went to see her PCP who prescribed her meclizine. She does not believe that she was provided a steroid at that time but does not recall. She was referred to [facility]NT who performed a VNG study as well as an audiogram. Pt states that everything was negative and her hearing is excellent. She was referred to neurology who ordered an MRI which was negative with the exception of increased fluid in her sinuses. She saw Joanie Coddington PA-C who prescribed her amoxicillin and prednisone for a presumed sinus infection. She reports that her sinus pressure has improved significantly since starting these medications. However she reports that the morning of 09/23/18 she woke up  and had severe dizziness again. She states that her symptoms lasted for a whole week. She had the same problem in 1997 and it eventually self-resolved. She has been taking two Excedrin migraine in the morning since starting the Prednisone because it causes headaches for her. Around the time the dizziness started in May 2019 she also started to get headaches. Her headaches are bilateral and start in the occipital region and then spread to the top of her head. Pt states that she gets blurring of her vision during these episodes. She has been diagnosed with ocular migraines and she typically experiences narrowing of her vision even to the point of complete visual loss. She had 3 to 4 ocular migraines around  the time the dizziness started in May and she reports that prior to that it had been a long time since she had a migraine. Pt reports 2 ocular migraines in the last 2 weeks.     Diagnostic tests  MRI WNL except for fluid in her sinuses    Patient Stated Goals  "I want to move better" "get rid of dizziness" "to be able to take care of grandchildren and great grandchildren"    Currently in Pain?  No/denies           TREATMENT  Neuromuscular Re-education  NuStep L2 x 5 minutes for warm-up during history (3 minutes unbilled); With patient performed 5TSTS (12.9s), 45m gait speed (self-selected 13.3s = 0.47m/s, fastest: 10.4s = 0.96 m/s) and DVA (static 20/25, dynamic=20/50 which is a 3 line loss);   VOR x 1 VOR x 1 horizontal in sitting 60s x 3 (3/10 dizziness), added to HEP;   Head Turns Semitandem balance alternating forward LE with horizontal head turns x 30s each; Tandem balance alternating forward LE with horizontal head turns x 30s each (added to HEP);   Toe Taps  Airex balance with alternating toe taps to 4" step x 10 and then 6" step x 10;   Ball Passes Airex balance with feet together with trunk rotation to pass ball with therapist varying height from waist to overhead without UE support with multiple bouts to each sidee;   Pt educated throughout session about proper posture and technique with exercises. Improved exercise technique after min to mod verbal, visual, tactile cues.    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 Ten meter gait speed is below speed required for full community mobility. She reports dizziness during VOR x 1 horizontal so this is added to her HEP in addition to semitandem/tandem balance with horizontal head turns. Pt encouraged to perform HEP and follow-up as scheduled.                     PT Short Term Goals - 10/06/18 0915      PT SHORT TERM GOAL #1   Title  Pt will be  independent with HEP in order to improve strength, balance, and dizziness in order to decrease fall risk and improve function at home.    Time  4    Period  Weeks    Status  New    Target Date  11/02/18        PT Long Term Goals - 10/06/18 0916      PT LONG TERM GOAL #1   Title  Pt will improve BERG by at least 3 points in order to demonstrate clinically significant improvement in balance.      Baseline  10/05/18: 52/56    Time  8    Period  Weeks    Status  New    Target Date  11/30/18      PT LONG TERM GOAL #2   Title  Pt will improve DGI by at least 3 points in order to demonstrate clinically significant improvement in balance and decreased risk for falls.    Baseline  10/05/18: 17/24    Time  8    Period  Weeks    Status  New    Target Date  11/30/18      PT LONG TERM GOAL #3   Title  Pt will improve ABC by at least 13% in order to demonstrate clinically significant improvement in balance confidence.     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 Ten meter gait speed is below speed required for full community mobility. She reports dizziness during VOR x 1 horizontal so this is added to her HEP in addition to semitandem/tandem balance with horizontal head turns. Pt encouraged to perform HEP and follow-up as scheduled.    Rehab Potential  Good    PT Frequency  1x / week    PT Duration  8 weeks    PT Treatment/Interventions  Electrical Stimulation;ADLs/Self Care Home Management;Aquatic  Therapy;Biofeedback;Canalith Repostioning;Cryotherapy;Iontophoresis 4mg /ml Dexamethasone;Moist Heat;Traction;Ultrasound;DME Instruction;Gait training;Stair training;Functional mobility training;Therapeutic exercise;Balance training;Neuromuscular re-education;Patient/family education;Manual techniques;Passive range of motion;Dry needling;Vestibular    PT Next Visit Plan  DVA, 10m gait speed, 5TSTS, initiate HEP (VOR x 1 horizontal, semitandem balance with head turns)    PT Home Exercise Plan  VOR x 1 horizontal, semitandem/tandem balance with horizontal head turns    Consulted and Agree with Plan of Care  Patient       Patient will benefit from skilled therapeutic intervention in order to improve the following deficits and impairments:  Abnormal gait, Decreased balance, Dizziness, Difficulty walking  Visit Diagnosis: Dizziness and giddiness  Unsteadiness on feet     Problem List Patient Active Problem List   Diagnosis Date Noted  . COPD (chronic obstructive pulmonary disease) (HCC) 09/24/2016  . Essential hypertension 09/24/2016  . Hyperlipidemia 09/24/2016  . Varicose veins of both lower extremities with pain 09/24/2016   Lynnea Maizes PT, DPT, GCS  Brendalyn Vallely 10/13/2018, 12:41 PM  [facility] MAIN [facility] SERVICES [address] [place], [place], [address] Phone: [phone]   Fax:  [phone]  Name: Rhonda Oconnell MRN: [id number] Date of Birth: 09-03-1951

## 2018-10-23 ENCOUNTER — Ambulatory Visit: Payer: Medicare Other

## 2018-10-23 DIAGNOSIS — R42 Dizziness and giddiness: Secondary | ICD-10-CM

## 2018-10-23 DIAGNOSIS — R2681 Unsteadiness on feet: Secondary | ICD-10-CM

## 2018-10-23 NOTE — Therapy (Signed)
[facility] [facility] MAIN [facility] SERVICES [address] [place], [place], [address] Phone: [phone]   Fax:  [phone]  Physical Therapy Treatment  Patient Details  Name: Rhonda Oconnell MRN: [id number] Date of Birth: Sep 27, 1951 Referring Provider (PT): Dr. Sherryll Burger   Encounter Date: 10/23/2018  PT End of Session - 10/23/18 0936    Visit Number  3    Number of Visits  9    Date for PT Re-Evaluation  11/30/18    Authorization Type  Progress note: 3/10, Last goals: 10/05/18    PT Start Time  0934    PT Stop Time  1015    PT Time Calculation (min)  41 min    Equipment Utilized During Treatment  Gait belt    Activity Tolerance  Patient tolerated treatment well    Behavior During Therapy  [facility] for tasks assessed/performed       Past Medical History:  Diagnosis Date  . Abdominal hernia   . Allergic state   . Anemia   . Arthritis   . Asthma   . Cataract cortical, senile   . COPD (chronic obstructive pulmonary disease) (HCC)   . Dysphagia   . GERD (gastroesophageal reflux disease)   . Hemorrhoids   . Hyperlipidemia   . Hypertension   . Rectal tear   . Rectocele     Past Surgical History:  Procedure Laterality Date  . ABDOMINAL HYSTERECTOMY    . ARTHOSCOPIC ROTAOR CUFF REPAIR    . BLADDER REPAIR    . CATARACT EXTRACTION    . CHOLECYSTECTOMY    . COLONOSCOPY WITH PROPOFOL N/A 07/22/2016   Procedure: COLONOSCOPY WITH PROPOFOL;  Surgeon: Scot Jun, MD;  Location: [facility] ENDOSCOPY;  Service: Endoscopy;  Laterality: N/A;  . ESOPHAGOGASTRODUODENOSCOPY (EGD) WITH PROPOFOL N/A 07/22/2016   Procedure: ESOPHAGOGASTRODUODENOSCOPY (EGD) WITH PROPOFOL;  Surgeon: Scot Jun, MD;  Location: [facility] ENDOSCOPY;  Service: Endoscopy;  Laterality: N/A;  . HEMORRHOID SURGERY    . HERNIA REPAIR    . LAPAROSCOPIC SPLENECTOMY    . TUBAL LIGATION      There were no vitals filed for this visit.  Subjective Assessment - 10/23/18 0935    Subjective  Pt  reports that she is dong well today. She has improved since the last visit with respect to her "spacey feelings. Pt states that she has seen about 75% improvement in her dizziness symptoms since starting therapy. No specific questions or concerns at this time. Denies pain    Pertinent History  Pt states that on Apr 27, 2018 she woke up and tried to go to the bathroom and almost fell on her face. Pt states that the severe symptoms lasted for a couple days and it gradually improved but never fully resolved. In June she had another bout and she went to see her PCP who prescribed her meclizine. She does not believe that she was provided a steroid at that time but does not recall. She was referred to [facility]NT who performed a VNG study as well as an audiogram. Pt states that everything was negative and her hearing is excellent. She was referred to neurology who ordered an MRI which was negative with the exception of increased fluid in her sinuses. She saw Joanie Coddington PA-C who prescribed her amoxicillin and prednisone for a presumed sinus infection. She reports that her sinus pressure has improved significantly since starting these medications. However she reports that the morning of 09/23/18 she woke up and had  severe dizziness again. She states that her symptoms lasted for a whole week. She had the same problem in 1997 and it eventually self-resolved. She has been taking two Excedrin migraine in the morning since starting the Prednisone because it causes headaches for her. Around the time the dizziness started in May 2019 she also started to get headaches. Her headaches are bilateral and start in the occipital region and then spread to the top of her head. Pt states that she gets blurring of her vision during these episodes. She has been diagnosed with ocular migraines and she typically experiences narrowing of her vision even to the point of complete visual loss. She had 3 to 4 ocular migraines around the time  the dizziness started in May and she reports that prior to that it had been a long time since she had a migraine. Pt reports 2 ocular migraines in the last 2 weeks.     Diagnostic tests  MRI WNL except for fluid in her sinuses    Patient Stated Goals  "I want to move better" "get rid of dizziness" "to be able to take care of grandchildren and great grandchildren"    Currently in Pain?  No/denies           TREATMENT   Neuromuscular Re-education  NuStep L2 x 2 minutes, L4 x 3 minutes for warm-up during history (3 minutes unbilled);  VOR x 1 VOR x 1 horizontal in standing with feet apart x 60s (2/10 dizziness), with feet together x 60s (2/10 dizziness), with feet together and conflicting background x 60s (5/10) added conflicting background to HEP;  Head Turns Tandem balance alternating forward LE with horizontal and vertical head turns x 30s each;  Tandem Gait Tandem gait in // bars without UE support x 6 lengths;  Toe Taps  Airex balance with alternating toe taps to 6" step x 15;  Step-ups While standing on Airex stepping up to 6" step with Airex on top alternating LE x 10 each;  Ball Passes Airex balance with feet together with trunk rotation to pass ball with therapist varying height from waist to overhead without UE support with multiple bouts to each side;  Gait in Hallway Forward ambulation in hallway with ball toss to therapist on each side as well as vertical ball toss to self x 75' each;   Pt educated throughout session about proper posture and technique with exercises. Improved exercise technique after min to mod verbal, visual, tactile cues.    Pt is making excellent progress with therapy and reports approximately 75% improvement in symptoms since starting therapy. She was able to progress her HEP and added tandem walking. She struggled considerably with ball tosses during ambulation in hallway.  Pt encouraged to perform HEP and follow-up as scheduled. Pt  will benefit from PT services to address deficits in strength, balance, and mobility in order to return to full function at home.                       PT Short Term Goals - 10/06/18 0915      PT SHORT TERM GOAL #1   Title  Pt will be independent with HEP in order to improve strength, balance, and dizziness in order to decrease fall risk and improve function at home.    Time  4    Period  Weeks    Status  New    Target Date  11/02/18  PT Long Term Goals - 10/06/18 0916      PT LONG TERM GOAL #1   Title  Pt will improve BERG by at least 3 points in order to demonstrate clinically significant improvement in balance.      Baseline  10/05/18: 52/56    Time  8    Period  Weeks    Status  New    Target Date  11/30/18      PT LONG TERM GOAL #2   Title  Pt will improve DGI by at least 3 points in order to demonstrate clinically significant improvement in balance and decreased risk for falls.    Baseline  10/05/18: 17/24    Time  8    Period  Weeks    Status  New    Target Date  11/30/18      PT LONG TERM GOAL #3   Title  Pt will improve ABC by at least 13% in order to demonstrate clinically significant improvement in balance confidence.     Baseline  10/05/18: 45%    Time  8    Period  Weeks    Status  New    Target Date  11/30/18      PT LONG TERM GOAL #4   Title  Pt will decrease DHI score by at least 18 points in order to demonstrate clinically significant reduction in disability     Baseline  10/05/18: 68/100    Time  8    Period  Weeks    Status  New    Target Date  11/30/18            Plan - 10/23/18 1610    Clinical Impression Statement  Pt is making excellent progress with therapy and reports approximately 75% improvement in symptoms since starting therapy. She was able to progress her HEP and added tandem walking. She struggled considerably with ball tosses during ambulation in hallway.  Pt encouraged to perform HEP and follow-up as  scheduled. Pt will benefit from PT services to address deficits in strength, balance, and mobility in order to return to full function at home.    Rehab Potential  Good    PT Frequency  1x / week    PT Duration  8 weeks    PT Treatment/Interventions  Electrical Stimulation;ADLs/Self Care Home Management;Aquatic Therapy;Biofeedback;Canalith Repostioning;Cryotherapy;Iontophoresis 4mg /ml Dexamethasone;Moist Heat;Traction;Ultrasound;DME Instruction;Gait training;Stair training;Functional mobility training;Therapeutic exercise;Balance training;Neuromuscular re-education;Patient/family education;Manual techniques;Passive range of motion;Dry needling;Vestibular    PT Next Visit Plan  Continue progressing strength and balance as well as vestibular exercises    PT Home Exercise Plan  VOR x 1 horizontal standing with feet together and conflicting background, tandem balance with horizontal and vertical head turns, tandem gait next to counter    Consulted and Agree with Plan of Care  Patient       Patient will benefit from skilled therapeutic intervention in order to improve the following deficits and impairments:  Abnormal gait, Decreased balance, Dizziness, Difficulty walking  Visit Diagnosis: Dizziness and giddiness  Unsteadiness on feet     Problem List Patient Active Problem List   Diagnosis Date Noted  . COPD (chronic obstructive pulmonary disease) (HCC) 09/24/2016  . Essential hypertension 09/24/2016  . Hyperlipidemia 09/24/2016  . Varicose veins of both lower extremities with pain 09/24/2016   Lynnea Maizes PT, DPT, GCS  Annikah Lovins 10/23/2018, 12:57 PM  [place] [facility] MAIN [facility] SERVICES [address] [place], [place], [address] Phone: [phone]  Fax:  [phone]  Name: Rhonda Oconnell MRN: [id number] Date of Birth: November 06, 1951

## 2018-10-23 NOTE — Patient Instructions (Signed)
Access Code: [email or web address]  URL: [email or web address]  Date: 10/23/2018  Prepared by: Ria CommentJason Arsenio Schnorr   Exercises  Tandem Walking - 3 reps - 60 seconds hold - 2x daily - 7x weekly

## 2018-10-30 ENCOUNTER — Ambulatory Visit: Payer: Medicare Other

## 2018-10-30 DIAGNOSIS — R42 Dizziness and giddiness: Secondary | ICD-10-CM | POA: Diagnosis not present

## 2018-10-30 DIAGNOSIS — R2681 Unsteadiness on feet: Secondary | ICD-10-CM

## 2018-10-30 NOTE — Therapy (Signed)
[place] [facility] MAIN [facility] SERVICES [address] [place], [place], [address] Phone: [phone]   Fax:  [phone]  Physical Therapy Treatment  Patient Details  Name: Rhonda Oconnell MRN: [id number] Date of Birth: 09/11/1951 Referring Provider (PT): Dr. Sherryll Burger   Encounter Date: 10/30/2018  PT End of Session - 10/30/18 1024    Visit Number  4    Number of Visits  9    Date for PT Re-Evaluation  11/30/18    Authorization Type  Progress note: 4/10, Last goals: 10/05/18    PT Start Time  1021    PT Stop Time  1100    PT Time Calculation (min)  39 min    Equipment Utilized During Treatment  Gait belt    Activity Tolerance  Patient tolerated treatment well    Behavior During Therapy  [facility] for tasks assessed/performed       Past Medical History:  Diagnosis Date  . Abdominal hernia   . Allergic state   . Anemia   . Arthritis   . Asthma   . Cataract cortical, senile   . COPD (chronic obstructive pulmonary disease) (HCC)   . Dysphagia   . GERD (gastroesophageal reflux disease)   . Hemorrhoids   . Hyperlipidemia   . Hypertension   . Rectal tear   . Rectocele     Past Surgical History:  Procedure Laterality Date  . ABDOMINAL HYSTERECTOMY    . ARTHOSCOPIC ROTAOR CUFF REPAIR    . BLADDER REPAIR    . CATARACT EXTRACTION    . CHOLECYSTECTOMY    . COLONOSCOPY WITH PROPOFOL N/A 07/22/2016   Procedure: COLONOSCOPY WITH PROPOFOL;  Surgeon: Scot Jun, MD;  Location: [facility] ENDOSCOPY;  Service: Endoscopy;  Laterality: N/A;  . ESOPHAGOGASTRODUODENOSCOPY (EGD) WITH PROPOFOL N/A 07/22/2016   Procedure: ESOPHAGOGASTRODUODENOSCOPY (EGD) WITH PROPOFOL;  Surgeon: Scot Jun, MD;  Location: [facility] ENDOSCOPY;  Service: Endoscopy;  Laterality: N/A;  . HEMORRHOID SURGERY    . HERNIA REPAIR    . LAPAROSCOPIC SPLENECTOMY    . TUBAL LIGATION      There were no vitals filed for this visit.  Subjective Assessment - 10/30/18 1023    Subjective  Pt  reports that she is dong well today. Pt states that she has seen about 80-85% improvement in her dizziness symptoms since starting therapy. No specific questions or concerns at this time. Reports 5/10 sinus pain upon arrival today. Will be seeing her MD regarding recurrent sinus infection.    Pertinent History  Pt states that on Apr 27, 2018 she woke up and tried to go to the bathroom and almost fell on her face. Pt states that the severe symptoms lasted for a couple days and it gradually improved but never fully resolved. In June she had another bout and she went to see her PCP who prescribed her meclizine. She does not believe that she was provided a steroid at that time but does not recall. She was referred to [facility] ENT who performed a VNG study as well as an audiogram. Pt states that everything was negative and her hearing is excellent. She was referred to neurology who ordered an MRI which was negative with the exception of increased fluid in her sinuses. She saw Joanie Coddington PA-C who prescribed her amoxicillin and prednisone for a presumed sinus infection. She reports that her sinus pressure has improved significantly since starting these medications. However she reports that the morning of 09/23/18 she woke up and  had severe dizziness again. She states that her symptoms lasted for a whole week. She had the same problem in 1997 and it eventually self-resolved. She has been taking two Excedrin migraine in the morning since starting the Prednisone because it causes headaches for her. Around the time the dizziness started in May 2019 she also started to get headaches. Her headaches are bilateral and start in the occipital region and then spread to the top of her head. Pt states that she gets blurring of her vision during these episodes. She has been diagnosed with ocular migraines and she typically experiences narrowing of her vision even to the point of complete visual loss. She had 3 to 4 ocular migraines  around the time the dizziness started in May and she reports that prior to that it had been a long time since she had a migraine. Pt reports 2 ocular migraines in the last 2 weeks.     Diagnostic tests  MRI WNL except for fluid in her sinuses    Patient Stated Goals  "I want to move better" "get rid of dizziness" "to be able to take care of grandchildren and great grandchildren"    Currently in Pain?  Yes    Pain Score  5     Pain Location  Head    Pain Orientation  Right    Pain Descriptors / Indicators  Aching;Headache    Pain Type  Acute pain    Pain Onset  1 to 4 weeks ago             TREATMENT   Neuromuscular Re-education  VOR x 1 VOR x 1 horizontal in standing with feet together and conflicting background x 60s (2/10 dizziness); VOR x 1 with forward/backward ambulation 35' x 2 each, pt denies dizziness but reports unsteadiness;  Head Turns Tandem balance alternating forward LE with horizontal and vertical head turns x 30s each;  Tandem Gait Tandem gait in // bars without UE support x 6 lengths;  Gait in Hallway Forward ambulation in hallway with ball toss to therapist on each side as well as vertical ball toss to self x 75' each;  Ball Passes Airex balance with feet together with trunk rotation to pass ball with therapist varying height from waist to overhead without UE support with multiple bouts to each side;   Ther-ex  NuStep L2 x 3 minutes for warm-up during history (1 minutes unbilled); Quantum leg press 105# x 20, 120# x 20; Standing marches in // bars with 2# ankle weights (AW) x 15 bilateral; Standing HS curls in // bars with 2# AW x 15 bilateral; Standing hip abduction in // bars with 2# AW x 15 bilateral; Standing hip extension in // bars with 2# AW x 15 bilateral;   Pt educated throughout session about proper posture and technique with exercises. Improved exercise technique after min to mod verbal, visual, tactile cues.   Pt is making  excellent progress with therapy and reports approximately 80-85% improvement in symptoms since starting therapy. She is demonstrating improved stability with tandem gait and tandem head turns. She continues to struggles with ball tosses during ambulation in hallway but it is much improved from previous session.  Pt encouraged to perform HEP and follow-up as scheduled.Pt will benefit from PT services to address deficits in strength, balance, and mobility in order to return to full function at home.  PT Short Term Goals - 10/06/18 0915      PT SHORT TERM GOAL #1   Title  Pt will be independent with HEP in order to improve strength, balance, and dizziness in order to decrease fall risk and improve function at home.    Time  4    Period  Weeks    Status  New    Target Date  11/02/18        PT Long Term Goals - 10/06/18 0916      PT LONG TERM GOAL #1   Title  Pt will improve BERG by at least 3 points in order to demonstrate clinically significant improvement in balance.      Baseline  10/05/18: 52/56    Time  8    Period  Weeks    Status  New    Target Date  11/30/18      PT LONG TERM GOAL #2   Title  Pt will improve DGI by at least 3 points in order to demonstrate clinically significant improvement in balance and decreased risk for falls.    Baseline  10/05/18: 17/24    Time  8    Period  Weeks    Status  New    Target Date  11/30/18      PT LONG TERM GOAL #3   Title  Pt will improve ABC by at least 13% in order to demonstrate clinically significant improvement in balance confidence.     Baseline  10/05/18: 45%    Time  8    Period  Weeks    Status  New    Target Date  11/30/18      PT LONG TERM GOAL #4   Title  Pt will decrease DHI score by at least 18 points in order to demonstrate clinically significant reduction in disability     Baseline  10/05/18: 68/100    Time  8    Period  Weeks    Status  New    Target Date  11/30/18             Plan - 10/30/18 1025    Clinical Impression Statement  Pt is making excellent progress with therapy and reports approximately 80-85% improvement in symptoms since starting therapy. She is demonstrating improved stability with tandem gait and tandem head turns. She continues to struggles with ball tosses during ambulation in hallway but it is much improved from previous session.  Pt encouraged to perform HEP and follow-up as scheduled.Pt will benefit from PT services to address deficits in strength, balance, and mobility in order to return to full function at home.     Rehab Potential  Good    PT Frequency  1x / week    PT Duration  8 weeks    PT Treatment/Interventions  Electrical Stimulation;ADLs/Self Care Home Management;Aquatic Therapy;Biofeedback;Canalith Repostioning;Cryotherapy;Iontophoresis 4mg /ml Dexamethasone;Moist Heat;Traction;Ultrasound;DME Instruction;Gait training;Stair training;Functional mobility training;Therapeutic exercise;Balance training;Neuromuscular re-education;Patient/family education;Manual techniques;Passive range of motion;Dry needling;Vestibular    PT Next Visit Plan  Continue progressing strength and balance as well as vestibular exercises    PT Home Exercise Plan  VOR x 1 horizontal standing with feet together and conflicting background, tandem balance with horizontal and vertical head turns, tandem gait next to counter    Consulted and Agree with Plan of Care  Patient       Patient will benefit from skilled therapeutic intervention in order to improve the following deficits and impairments:  Abnormal gait, Decreased balance, Dizziness, Difficulty  walking  Visit Diagnosis: Dizziness and giddiness  Unsteadiness on feet     Problem List Patient Active Problem List   Diagnosis Date Noted  . COPD (chronic obstructive pulmonary disease) (HCC) 09/24/2016  . Essential hypertension 09/24/2016  . Hyperlipidemia 09/24/2016  . Varicose veins of both  lower extremities with pain 09/24/2016   Lynnea Maizes PT, DPT, GCS  Zynasia Burklow 10/30/2018, 2:17 PM  [place] [facility] MAIN [facility] SERVICES [address] [place], [place], [address] Phone: [phone]   Fax:  [phone]  Name: GRETEL CANTU MRN: [id number] Date of Birth: [birth year]

## 2018-11-02 ENCOUNTER — Ambulatory Visit: Payer: Medicare Other

## 2018-11-02 DIAGNOSIS — R42 Dizziness and giddiness: Secondary | ICD-10-CM

## 2018-11-02 DIAGNOSIS — R2681 Unsteadiness on feet: Secondary | ICD-10-CM

## 2018-11-02 NOTE — Therapy (Signed)
[place] [facility] MAIN [facility] SERVICES [address] [place], [place], [address] Phone: [phone]   Fax:  [phone]  Physical Therapy Treatment  Patient Details  Name: BLIMI GODBY MRN: [id number] Date of Birth: 1951-01-27 Referring Provider (PT): Dr. Sherryll Burger   Encounter Date: 11/02/2018  PT End of Session - 11/02/18 0856    Visit Number  5    Number of Visits  9    Date for PT Re-Evaluation  11/30/18    Authorization Type  Progress note: 5/10, Last goals: 10/05/18    PT Start Time  0850    PT Stop Time  0930    PT Time Calculation (min)  40 min    Equipment Utilized During Treatment  Gait belt    Activity Tolerance  Patient tolerated treatment well    Behavior During Therapy  [facility] for tasks assessed/performed       Past Medical History:  Diagnosis Date  . Abdominal hernia   . Allergic state   . Anemia   . Arthritis   . Asthma   . Cataract cortical, senile   . COPD (chronic obstructive pulmonary disease) (HCC)   . Dysphagia   . GERD (gastroesophageal reflux disease)   . Hemorrhoids   . Hyperlipidemia   . Hypertension   . Rectal tear   . Rectocele     Past Surgical History:  Procedure Laterality Date  . ABDOMINAL HYSTERECTOMY    . ARTHOSCOPIC ROTAOR CUFF REPAIR    . BLADDER REPAIR    . CATARACT EXTRACTION    . CHOLECYSTECTOMY    . COLONOSCOPY WITH PROPOFOL N/A 07/22/2016   Procedure: COLONOSCOPY WITH PROPOFOL;  Surgeon: Scot Jun, MD;  Location: [facility] ENDOSCOPY;  Service: Endoscopy;  Laterality: N/A;  . ESOPHAGOGASTRODUODENOSCOPY (EGD) WITH PROPOFOL N/A 07/22/2016   Procedure: ESOPHAGOGASTRODUODENOSCOPY (EGD) WITH PROPOFOL;  Surgeon: Scot Jun, MD;  Location: [facility] ENDOSCOPY;  Service: Endoscopy;  Laterality: N/A;  . HEMORRHOID SURGERY    . HERNIA REPAIR    . LAPAROSCOPIC SPLENECTOMY    . TUBAL LIGATION      There were no vitals filed for this visit.  Subjective Assessment - 11/02/18 0855    Subjective  Pt  reports that she is dong well today. She saw her PCP regarding her persistent sinus pressure and was referred to ENT. She has an appointment to see ENT today at 15:00. She denies any pain today but does reports some sinus pressure    Pertinent History  Pt states that on Apr 27, 2018 she woke up and tried to go to the bathroom and almost fell on her face. Pt states that the severe symptoms lasted for a couple days and it gradually improved but never fully resolved. In June she had another bout and she went to see her PCP who prescribed her meclizine. She does not believe that she was provided a steroid at that time but does not recall. She was referred to [facility] ENT who performed a VNG study as well as an audiogram. Pt states that everything was negative and her hearing is excellent. She was referred to neurology who ordered an MRI which was negative with the exception of increased fluid in her sinuses. She saw Joanie Coddington PA-C who prescribed her amoxicillin and prednisone for a presumed sinus infection. She reports that her sinus pressure has improved significantly since starting these medications. However she reports that the morning of 09/23/18 she woke up and had severe dizziness again. She  states that her symptoms lasted for a whole week. She had the same problem in 1997 and it eventually self-resolved. She has been taking two Excedrin migraine in the morning since starting the Prednisone because it causes headaches for her. Around the time the dizziness started in May 2019 she also started to get headaches. Her headaches are bilateral and start in the occipital region and then spread to the top of her head. Pt states that she gets blurring of her vision during these episodes. She has been diagnosed with ocular migraines and she typically experiences narrowing of her vision even to the point of complete visual loss. She had 3 to 4 ocular migraines around the time the dizziness started in May and she  reports that prior to that it had been a long time since she had a migraine. Pt reports 2 ocular migraines in the last 2 weeks.     Diagnostic tests  MRI WNL except for fluid in her sinuses    Patient Stated Goals  "I want to move better" "get rid of dizziness" "to be able to take care of grandchildren and great grandchildren"    Currently in Pain?  No/denies   Denies pain but reports sinus pressure        TREATMENT   Neuromuscular Re-education  VOR x 1 VOR x 1 conflicting background with forward/backward ambulation 35' x 3 each, pt reports 2/10 dizziness, less unsteadiness noted today;  Tandem Gait Tandem gait in // bars without UE support x 6 lengths, improved balance noted;  Gait in Hallway Forward and retro ambulation in hallway with vertical ball toss to self as well as horizontal ball toss to therapist on both sides x multiple bouts of 75' each;   Ther-ex  Octane L3-4 x67minutesfor warm-up during history (1 minutes unbilled); Quantum leg press 120# x 20, 135# x 20; Standing marches in // bars with 3# ankle weights (AW) x 15 bilateral; Standing HS curls in // bars with 3# AW x 15 bilateral; Standing hip abduction in // bars with 3# AW x 15 bilateral; Standing hip extension in // bars with 3# AW x 15 bilateral; Sit to stand without UE support x 10, with 5# overhead bar press x 10;   Pt educated throughout session about proper posture and technique with exercises. Improved exercise technique after min to mod verbal, visual, tactile cues.   Pt has an appointment this afternoon to see ENT to discuss her recurrent sinus issues. She continues to make excellent progress with therapy and is able to incorporate more strengthening exercises into session. She is demonstrating improved stability with tandem gait. Continued improvement with ball tosses during ambulation in hallway. Will repeat outcome measures and update goals at next session.Pt encouraged to perform HEP  and follow-up as scheduled.Pt will benefit from PT services to address deficits in strength, balance, and mobility in order to return to full function at home.                        PT Short Term Goals - 10/06/18 0915      PT SHORT TERM GOAL #1   Title  Pt will be independent with HEP in order to improve strength, balance, and dizziness in order to decrease fall risk and improve function at home.    Time  4    Period  Weeks    Status  New    Target Date  11/02/18  PT Long Term Goals - 10/06/18 0916      PT LONG TERM GOAL #1   Title  Pt will improve BERG by at least 3 points in order to demonstrate clinically significant improvement in balance.      Baseline  10/05/18: 52/56    Time  8    Period  Weeks    Status  New    Target Date  11/30/18      PT LONG TERM GOAL #2   Title  Pt will improve DGI by at least 3 points in order to demonstrate clinically significant improvement in balance and decreased risk for falls.    Baseline  10/05/18: 17/24    Time  8    Period  Weeks    Status  New    Target Date  11/30/18      PT LONG TERM GOAL #3   Title  Pt will improve ABC by at least 13% in order to demonstrate clinically significant improvement in balance confidence.     Baseline  10/05/18: 45%    Time  8    Period  Weeks    Status  New    Target Date  11/30/18      PT LONG TERM GOAL #4   Title  Pt will decrease DHI score by at least 18 points in order to demonstrate clinically significant reduction in disability     Baseline  10/05/18: 68/100    Time  8    Period  Weeks    Status  New    Target Date  11/30/18            Plan - 11/02/18 0857    Clinical Impression Statement  Pt has an appointment this afternoon to see ENT to discuss her recurrent sinus issues. She continues to make excellent progress with therapy and is able to incorporate more strengthening exercises into session. She is demonstrating improved stability with tandem gait.  Continued improvement with ball tosses during ambulation in hallway. Will repeat outcome measures and update goals at next session.Pt encouraged to perform HEP and follow-up as scheduled.Pt will benefit from PT services to address deficits in strength, balance, and mobility in order to return to full function at home.    Rehab Potential  Good    PT Frequency  1x / week    PT Duration  8 weeks    PT Treatment/Interventions  Electrical Stimulation;ADLs/Self Care Home Management;Aquatic Therapy;Biofeedback;Canalith Repostioning;Cryotherapy;Iontophoresis 4mg /ml Dexamethasone;Moist Heat;Traction;Ultrasound;DME Instruction;Gait training;Stair training;Functional mobility training;Therapeutic exercise;Balance training;Neuromuscular re-education;Patient/family education;Manual techniques;Passive range of motion;Dry needling;Vestibular    PT Next Visit Plan  Outcome measures, goals, progress note (no recert needed), continue progressing strength and balance as well as vestibular exercises    PT Home Exercise Plan  VOR x 1 horizontal standing with feet together and conflicting background, tandem balance with horizontal and vertical head turns, tandem gait next to counter, ambulation with head turns in hallway    Consulted and Agree with Plan of Care  Patient       Patient will benefit from skilled therapeutic intervention in order to improve the following deficits and impairments:  Abnormal gait, Decreased balance, Dizziness, Difficulty walking  Visit Diagnosis: Dizziness and giddiness  Unsteadiness on feet     Problem List Patient Active Problem List   Diagnosis Date Noted  . COPD (chronic obstructive pulmonary disease) (HCC) 09/24/2016  . Essential hypertension 09/24/2016  . Hyperlipidemia 09/24/2016  . Varicose veins of both lower extremities with pain 09/24/2016  Lynnea Maizes PT, DPT, GCS  Darnice Comrie 11/02/2018, 10:14 AM  [place] [facility] MAIN [facility]  SERVICES [address] [place], [place], [address] Phone: [phone]   Fax:  [phone]  Name: BRYLEIGH OTTAWAY MRN: [id number] Date of Birth: May 04, 1951

## 2018-11-20 ENCOUNTER — Ambulatory Visit: Payer: Medicare Other | Attending: Neurology

## 2018-11-20 VITALS — BP 146/78 | HR 76

## 2018-11-20 DIAGNOSIS — R42 Dizziness and giddiness: Secondary | ICD-10-CM | POA: Insufficient documentation

## 2018-11-20 DIAGNOSIS — R2681 Unsteadiness on feet: Secondary | ICD-10-CM | POA: Insufficient documentation

## 2018-11-20 NOTE — Therapy (Signed)
[facility] MAIN [facility] SERVICES [address] [place], [place], [address] Phone: [phone]   Fax:  [phone]  Physical Therapy Treatment  Patient Details  Name: Rhonda Oconnell MRN: [id number] Date of Birth: [birth year] Referring Provider (PT): Dr. Sherryll Burger   Encounter Date: 11/20/2018  PT End of Session - 11/24/18 0846    Visit Number  6    Number of Visits  9    Date for PT Re-Evaluation  11/30/18    Authorization Type  Progress note: 6/10, Last goals: 10/05/18    PT Start Time  0938    PT Stop Time  1020    PT Time Calculation (min)  42 min    Equipment Utilized During Treatment  Gait belt    Activity Tolerance  Patient tolerated treatment well    Behavior During Therapy  [facility] for tasks assessed/performed       Past Medical History:  Diagnosis Date  . Abdominal hernia   . Allergic state   . Anemia   . Arthritis   . Asthma   . Cataract cortical, senile   . COPD (chronic obstructive pulmonary disease) (HCC)   . Dysphagia   . GERD (gastroesophageal reflux disease)   . Hemorrhoids   . Hyperlipidemia   . Hypertension   . Rectal tear   . Rectocele     Past Surgical History:  Procedure Laterality Date  . ABDOMINAL HYSTERECTOMY    . ARTHOSCOPIC ROTAOR CUFF REPAIR    . BLADDER REPAIR    . CATARACT EXTRACTION    . CHOLECYSTECTOMY    . COLONOSCOPY WITH PROPOFOL N/A 07/22/2016   Procedure: COLONOSCOPY WITH PROPOFOL;  Surgeon: Scot Jun, MD;  Location: [facility] ENDOSCOPY;  Service: Endoscopy;  Laterality: N/A;  . ESOPHAGOGASTRODUODENOSCOPY (EGD) WITH PROPOFOL N/A 07/22/2016   Procedure: ESOPHAGOGASTRODUODENOSCOPY (EGD) WITH PROPOFOL;  Surgeon: Scot Jun, MD;  Location: [facility] ENDOSCOPY;  Service: Endoscopy;  Laterality: N/A;  . HEMORRHOID SURGERY    . HERNIA REPAIR    . LAPAROSCOPIC SPLENECTOMY    . TUBAL LIGATION      Vitals:   11/20/18 0943  BP: (!) 146/78  Pulse: 76  SpO2: 100%    Subjective Assessment - 11/20/18  0941    Subjective  Pt states that she has been doing well up until yesterday. She bent over to pick something up and got very dizzy and felt off balance. She denies vertigo. She saw the ENT and they wanted to wait another week to see if her symptoms improved. and then to follow up. She sees the pulmonologist Tuesday. She was seeing Dr. Meredeth Ide previously but has not been to see the pulmonologist in a while. No specific questions or concerns currently.     Pertinent History  Pt states that on Apr 27, 2018 she woke up and tried to go to the bathroom and almost fell on her face. Pt states that the severe symptoms lasted for a couple days and it gradually improved but never fully resolved. In June she had another bout and she went to see her PCP who prescribed her meclizine. She does not believe that she was provided a steroid at that time but does not recall. She was referred to [facility] ENT who performed a VNG study as well as an audiogram. Pt states that everything was negative and her hearing is excellent. She was referred to neurology who ordered an MRI which was negative with the exception of increased fluid in her sinuses.  She saw Joanie CoddingtonBrianna Marota PA-C who prescribed her amoxicillin and prednisone for a presumed sinus infection. She reports that her sinus pressure has improved significantly since starting these medications. However she reports that the morning of 09/23/18 she woke up and had severe dizziness again. She states that her symptoms lasted for a whole week. She had the same problem in 1997 and it eventually self-resolved. She has been taking two Excedrin migraine in the morning since starting the Prednisone because it causes headaches for her. Around the time the dizziness started in May 2019 she also started to get headaches. Her headaches are bilateral and start in the occipital region and then spread to the top of her head. Pt states that she gets blurring of her vision during these episodes. She  has been diagnosed with ocular migraines and she typically experiences narrowing of her vision even to the point of complete visual loss. She had 3 to 4 ocular migraines around the time the dizziness started in May and she reports that prior to that it had been a long time since she had a migraine. Pt reports 2 ocular migraines in the last 2 weeks.     Diagnostic tests  MRI WNL except for fluid in her sinuses    Patient Stated Goals  "I want to move better" "get rid of dizziness" "to be able to take care of grandchildren and great grandchildren"    Currently in Pain?  No/denies   Chronic bilateral knee pain, unrelated           TREATMENT  Neuromuscular Re-education  VOR x 1 VOR x 1 conflicting background with forward/backward ambulation 35' x 2 each, pt denies dizziness today but reports some unsteadiness   Ther-ex Octane L3-4 x213minutesfor warm-up during history (1minutes unbilled); Quantum leg press 135# x 20, 150# x 20;   Pt educated throughout session about proper posture and technique with exercises. Improved exercise technique after min to mod verbal, visual, tactile cues.   Canalith Repositioning Treatment Pt reports a brief bout of dizziness and vertigo when bending over yesterday which lasted 3-4 minutes. Performed Dix-Hallpike test which is negative on the left and positive for brief symptoms of vertigo on the right but without visible nystagmus. Pt taken through one round of CRT and then retested. She reports improved symptoms but still very faint vertigo which is very brief when first laying down. Pt taken through second CRT with one final retest afterward which is negative for vertigo or nystagmus.   Pt reports a brief bout of dizziness and vertigo when bending over yesterday which lasted 3-4 minutes after the last treatment session so tested pt again for BPPV. Performed Dix-Hallpike test which is negative on the left and positive for brief symptoms of vertigo  on the right but without visible nystagmus. Pt taken through two rounds of CRT with retesting between maneuvers. At the end of the session she has no further vertigo and still remains without nystagmus during Dix-Hallpike testing. Will restest at next session. She denies dizziness today with forward and retro ambulation while performing VOR. Will repeat outcome measures and update goals at next session.Pt encouraged to perform HEP and follow-up as scheduled.Pt will benefit from PT services to address deficits in strength, balance, and mobility in order to return to full function at home.                      PT Short Term Goals - 11/20/18 [id number]  PT SHORT TERM GOAL #1   Title  Pt will be independent with HEP in order to improve strength, balance, and dizziness in order to decrease fall risk and improve function at home.    Baseline  11/20/18: Performing regularly without issue    Time  4    Period  Weeks    Status  On-going    Target Date  11/02/18        PT Long Term Goals - 11/20/18 0950      PT LONG TERM GOAL #1   Title  Pt will improve BERG by at least 3 points in order to demonstrate clinically significant improvement in balance.      Baseline  10/05/18: 52/56    Time  8    Period  Weeks    Status  New      PT LONG TERM GOAL #2   Title  Pt will improve DGI by at least 3 points in order to demonstrate clinically significant improvement in balance and decreased risk for falls.    Baseline  10/05/18: 17/24    Time  8    Period  Weeks    Status  New      PT LONG TERM GOAL #3   Title  Pt will improve ABC by at least 13% in order to demonstrate clinically significant improvement in balance confidence.     Baseline  10/05/18: 45%    Time  8    Period  Weeks    Status  New      PT LONG TERM GOAL #4   Title  Pt will decrease DHI score by at least 18 points in order to demonstrate clinically significant reduction in disability     Baseline  10/05/18: 68/100     Time  8    Period  Weeks    Status  New            Plan - 11/24/18 0846    Clinical Impression Statement  Pt reports a brief bout of dizziness and vertigo when bending over yesterday which lasted 3-4 minutes after the last treatment session so tested pt again for BPPV. Performed Dix-Hallpike test which is negative on the left and positive for brief symptoms of vertigo on the right but without visible nystagmus. Pt taken through two rounds of CRT with retesting between maneuvers. At the end of the session she has no further vertigo and still remains without nystagmus during Dix-Hallpike testing. Will restest at next session. She denies dizziness today with forward and retro ambulation while performing VOR. Will repeat outcome measures and update goals at next session.Pt encouraged to perform HEP and follow-up as scheduled.Pt will benefit from PT services to address deficits in strength, balance, and mobility in order to return to full function at home.    Rehab Potential  Good    PT Frequency  1x / week    PT Duration  8 weeks    PT Treatment/Interventions  Electrical Stimulation;ADLs/Self Care Home Management;Aquatic Therapy;Biofeedback;Canalith Repostioning;Cryotherapy;Iontophoresis 4mg /ml Dexamethasone;Moist Heat;Traction;Ultrasound;DME Instruction;Gait training;Stair training;Functional mobility training;Therapeutic exercise;Balance training;Neuromuscular re-education;Patient/family education;Manual techniques;Passive range of motion;Dry needling;Vestibular    PT Next Visit Plan  Outcome measures, goals, progress note (no recert needed), Dix-Hallpike test and CRT as needed. Continue progressing strength and balance as well as vestibular exercises    PT Home Exercise Plan  VOR x 1 horizontal standing with feet together and conflicting background, tandem balance with horizontal and vertical head turns, tandem gait next to counter,  ambulation with head turns in hallway    Consulted and Agree  with Plan of Care  Patient       Patient will benefit from skilled therapeutic intervention in order to improve the following deficits and impairments:  Abnormal gait, Decreased balance, Dizziness, Difficulty walking  Visit Diagnosis: Dizziness and giddiness  Unsteadiness on feet     Problem List Patient Active Problem List   Diagnosis Date Noted  . COPD (chronic obstructive pulmonary disease) (HCC) 09/24/2016  . Essential hypertension 09/24/2016  . Hyperlipidemia 09/24/2016  . Varicose veins of both lower extremities with pain 09/24/2016   Lynnea Maizes PT, DPT, GCS  Rhonda Oconnell 11/24/2018, 9:25 AM  [facility] MAIN [facility] SERVICES [address] [place], [place], [address] Phone: [phone]   Fax:  [phone]  Name: Rhonda Oconnell MRN: [id number] Date of Birth: 1951/05/09

## 2018-11-24 ENCOUNTER — Ambulatory Visit (INDEPENDENT_AMBULATORY_CARE_PROVIDER_SITE_OTHER): Payer: Medicare Other | Admitting: Pulmonary Disease

## 2018-11-24 ENCOUNTER — Other Ambulatory Visit
Admission: RE | Admit: 2018-11-24 | Discharge: 2018-11-24 | Disposition: A | Discharge status: Home / Self Care | Payer: Medicare Other | Source: Ambulatory Visit | Attending: Pulmonary Disease | Admitting: Pulmonary Disease

## 2018-11-24 ENCOUNTER — Encounter: Payer: Self-pay | Admitting: Pulmonary Disease

## 2018-11-24 VITALS — BP 142/90 | HR 104 | Ht 62.0 in | Wt 221.4 lb

## 2018-11-24 DIAGNOSIS — Z6841 Body Mass Index (BMI) 40.0 and over, adult: Secondary | ICD-10-CM

## 2018-11-24 DIAGNOSIS — R05 Cough: Secondary | ICD-10-CM

## 2018-11-24 DIAGNOSIS — R059 Cough, unspecified: Secondary | ICD-10-CM

## 2018-11-24 DIAGNOSIS — J309 Allergic rhinitis, unspecified: Secondary | ICD-10-CM | POA: Diagnosis present

## 2018-11-24 DIAGNOSIS — J449 Chronic obstructive pulmonary disease, unspecified: Secondary | ICD-10-CM

## 2018-11-24 MED ORDER — BUDESONIDE-FORMOTEROL FUMARATE 160-4.5 MCG/ACT IN AERO: 2.0000 | INHALATION_SPRAY | Freq: Two times a day (BID) | RESPIRATORY_TRACT | 11 refills | Status: DC

## 2018-11-24 MED ORDER — SPACER/AERO-HOLDING CHAMBERS DEVI: 1.0000 | Freq: Every day | 0 refills | Status: AC

## 2018-11-24 MED ORDER — BUDESONIDE-FORMOTEROL FUMARATE 160-4.5 MCG/ACT IN AERO: 2.0000 | INHALATION_SPRAY | Freq: Two times a day (BID) | RESPIRATORY_TRACT | 0 refills | Status: DC

## 2018-11-24 NOTE — Progress Notes (Signed)
Subjective:    Patient ID: Rhonda Oconnell, female    DOB: 1951/12/08, 67 y.o.   MRN: [id number]  HPI This is a 67 year old former smoker (quit 08/15/1993) who presents for evaluation and management of COPD.  Previously the patient had followed with Dr. Meredeth Ide.  She is kindly referred by [facility].  He apparently was being evaluated at [facility]NT for a cough of 2 months duration.  It appears that her issues started with an episode of sinusitis in or around November 02, 2018.  She was treated with some improvement of her symptoms with Augmentin 875 mg twice a day, prednisone and nasal hygiene.  She subsequently had to have another "round" of antibiotics.  On follow-up evaluation at [facility] ENT, she had improved with regards to her sinusitis symptoms however she noted persistence of the cough.  At that time she was diagnosed with laryngopharyngeal reflux this was on 9 December.  It was recommended that she follow a strict antireflux diet and continue omeprazole.  She also was noted to have significant allergic rhinitis and at that time she was referred to [place] for follow-up of COPD. She has not had any fevers, chills or sweats.  She has noted that her nasal congestion is better however she continues to experience significant postnasal drip.  He has not noted gastroesophageal reflux symptoms as long as she takes her omeprazole.  She notes that when she lays down her cough gets worse if she has significant drainage.  She notes that cold air, hot flashes and walking long distances worsen her cough.  Pro-air (albuterol) makes her cough better.  Currently she is only on pro-air and Qvar for control of her symptoms.  She states that in the past she has been on Advair, she felt this helped her.  She stopped using this medication when her insurance failed to cover it.  She has not had any orthopnea, paroxysmal nocturnal dyspnea or lower extremity edema.  Her dyspnea has been at baseline for over 15 years with no  change.  She still feels that she can participate in her activities of daily living without difficulties.  Her past medical history, surgical history and family history has been reviewed and are as noted.  Her smoking history in the past is noted to be 1.5 to 2 packs/day starting at age 68 for a total of 20 years quitting in 1994.  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  She has no pets in the home.  Has not had military history.  Has resided in [place] all of her life.    Review of Systems  HENT: Positive for congestion, postnasal drip and sinus pressure.   Eyes: Negative.   Respiratory: Positive for cough (Non-productive).   Cardiovascular: Positive for palpitations.  Gastrointestinal: Negative.   Endocrine: Negative.   Genitourinary: Negative.   Musculoskeletal: Positive for arthralgias.  Skin: Negative.   Neurological: Negative.   Hematological: Negative.   Psychiatric/Behavioral: Negative.   All other systems reviewed and are negative.      Objective:   Physical Exam Vitals signs and nursing note reviewed.  Constitutional:      General: She is not in acute distress.    Appearance: She is obese. She is not ill-appearing.  HENT:     Head: Normocephalic and atraumatic.     Right Ear: Ear canal and external ear normal.     Left Ear: Ear canal and external ear normal.     Ears:  Comments: Bilateral serous otitis noted    Nose: Nose normal.     Right Turbinates: Swollen.     Left Turbinates: Swollen.     Mouth/Throat:     Mouth: Mucous membranes are moist.     Dentition: Normal dentition.     Palate: No lesions.     Comments: Significant clear postnasal drip noted.Prominent tonsils.  Airway Mallampati class III. Eyes:     Extraocular Movements: Extraocular movements intact.     Conjunctiva/sclera: Conjunctivae normal.  Neck:     Musculoskeletal: Neck supple. No crepitus.     Thyroid: No thyromegaly.     Trachea: Phonation normal. No  tracheal deviation.  Cardiovascular:     Rate and Rhythm: Regular rhythm. Tachycardia present.     Pulses: Normal pulses.     Heart sounds: Normal heart sounds. No murmur.     Comments: Tachycardia Pulmonary:     Effort: Pulmonary effort is normal.     Breath sounds: No decreased air movement. Decreased breath sounds (Diffuse distant breath sounds) present. No wheezing, rhonchi or rales.  Abdominal:     General: Abdomen is flat. There is no distension.     Palpations: Abdomen is soft.  Musculoskeletal: Normal range of motion.        General: No swelling.     Right lower leg: No edema.     Left lower leg: No edema.  Lymphadenopathy:     Cervical: No cervical adenopathy.  Skin:    General: Skin is warm and dry.     Coloration: Skin is not pale.  Neurological:     General: No focal deficit present.     Mental Status: She is alert and oriented to person, place, and time.  Psychiatric:        Mood and Affect: Mood normal.        Behavior: Behavior normal.     These are results from pulmonary function testing performed by Dr. Ned ClinesHerbon Fleming on 06 June 2016: SPIROMETRY: FVC was 2.65 liters, 98% of predicted/Post 2.72, 100%, 3% Change FEV1 was 1.17, 53% of predicted/Post 1.46, 66%, 25% Change FEV1 ratio was 44/Post 54 FEF 25-75% liters per second was 16% of predicted/Post 25%, 56% Change *SVN given with 2.5 mg Albuterol for Post Spirometry.  LUNG VOLUMES: TLC was 94% of predicted RV was 92% of predicted  DIFFUSION CAPACITY: DLCO was 81% of predicted DLCO/VA was 119% of predicted  FLOW VOLUME LOOP: Very delayed expiratory flow volume loop  Impression Moderate-severe obstruction, probable bronchodilator effect Lung volume and diffusion capacity are in low normal range Interpreted by Dr. Ned ClinesHerbon Fleming.  Today's spirometry shows an FEV1 of 1.3 L or 57% predicted with an FVC of 2.1 L or 73% predicted.  Her FEV1/FVC is 60%.  FEF 25-75% is 33%.  This is consistent with  moderate obstruction.  There is decrease in FVC which could indicate a concomitant restrictive process versus air trapping.  The patient will need formal PFTs at a later time.  This is my interpretation.  Assessment & Plan:   1.  Cough: This is likely multifactorial.  Suspect that she continues to have issues with significant postnasal drip and irritation of the upper airway (upper airway cough syndrome) reflux may or may not be playing a part.  Patient however has significant obstruction and appears not to be well compensated with regards to asthma/COPD overlap syndrome.  Please see management as below.  2.  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  He is  not well compensated.  We will discontinue Qvar and start her on Symbicort 160/4.5, 2 inhalations twice a day.  She will use a spacer for delivery.  She will rinse her mouth well after use.  She may still use Pro-Air (Albuterol) in between ,  up to 4 times a day if needed.  I suspect that her need for this medication will decrease significantly.   3.  Allergic rhinitis appears to be mostly perennial, she has multiple and still unknown triggers: Will obtain IgE and RAST.  She may require allergy evaluation.  4.  Morbid obesity (BMI 40): Weight loss is advised.  This issue adds complexity to her management and may be aggravating her issues above.   The patient is a remote former smoker given that she has quit smoking over 20 years, and she does not meet the criteria for low-dose cancer screening.  Thank you for allowing [place] to participate in this patient's care.

## 2018-11-24 NOTE — Progress Notes (Signed)
Subjective:    Patient ID: Rhonda Oconnell, female    DOB: Nov 29, 1951, 67 y.o.   MRN: [id number]  Cough  Associated symptoms include postnasal drip, shortness of breath and wheezing.  Sinus Problem  Associated symptoms include congestion, coughing, shortness of breath and sinus pressure.  Wheezing   Associated symptoms include coughing and shortness of breath.  Shortness of Breath  Associated symptoms include wheezing.   This is a 67 year old former smoker (quit 08/15/1993) who presents for evaluation and management of COPD.  Previously the patient had followed with Dr. Meredeth Ide.  She is kindly referred by [facility].  He apparently was being evaluated at [facility] for a cough of 2 months duration.  It appears that her issues started with an episode of sinusitis in or around November 02, 2018.  She was treated with some improvement of her symptoms with Augmentin 875 mg twice a day, prednisone and nasal hygiene.  She subsequently had to have another "round" of antibiotics.  On follow-up evaluation at [facility], she had improved with regards to her sinusitis symptoms however she noted persistence of the cough.  At that time she was diagnosed with laryngopharyngeal reflux this was on 9 December.  It was recommended that she follow a strict antireflux diet and continue omeprazole.  She also was noted to have significant allergic rhinitis and at that time she was referred to [place] for follow-up of COPD. She has not had any fevers, chills or sweats.  She has noted that her nasal congestion is better however she continues to experience significant postnasal drip.  He has not noted gastroesophageal reflux symptoms as long as she takes her omeprazole.  She notes that when she lays down her cough gets worse if she has significant drainage.  She notes that cold air, hot flashes and walking long distances worsen her cough.  Pro-air (albuterol) makes her cough better.  Currently she is only on pro-air and Qvar  for control of her symptoms.  She states that in the past she has been on Advair, she felt this helped her.  She stopped using this medication when her insurance failed to cover it.  She has not had any orthopnea, paroxysmal nocturnal dyspnea or lower extremity edema.  Her dyspnea has been at baseline for over 15 years with no change.  She still feels that she can participate in her activities of daily living without difficulties.  Her past medical history, surgical history and family history has been reviewed and are as noted.  Her smoking history in the past is noted to be 1.5 to 2 packs/day starting at age 81 for a total of 20 years quitting in 1994.  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  She has no pets in the home.  Has not had military history.  Has resided in [place] all of her life.    Review of Systems  HENT: Positive for congestion, postnasal drip and sinus pressure.   Eyes: Negative.   Respiratory: Positive for cough, shortness of breath and wheezing.   Cardiovascular: Positive for palpitations.  Gastrointestinal: Negative.   Endocrine: Negative.   Genitourinary: Negative.   Musculoskeletal: Positive for arthralgias.  Skin: Negative.   Neurological: Negative.   Hematological: Negative.   Psychiatric/Behavioral: Negative.   All other systems reviewed and are negative.      Objective:   Physical Exam Vitals signs and nursing note reviewed.  Constitutional:      General: She is not in  acute distress.    Appearance: She is obese. She is not ill-appearing.  HENT:     Head: Normocephalic and atraumatic.     Right Ear: Ear canal and external ear normal.     Left Ear: Ear canal and external ear normal.     Ears:     Comments: Bilateral serous otitis noted    Nose: Nose normal.     Right Turbinates: Swollen.     Left Turbinates: Swollen.     Mouth/Throat:     Mouth: Mucous membranes are moist.     Dentition: Normal dentition.     Palate: No  lesions.     Comments: Significant clear postnasal drip noted.Prominent tonsils.  Airway Mallampati class III. Eyes:     Extraocular Movements: Extraocular movements intact.     Conjunctiva/sclera: Conjunctivae normal.  Neck:     Musculoskeletal: Neck supple. No crepitus.     Thyroid: No thyromegaly.     Trachea: Phonation normal. No tracheal deviation.  Cardiovascular:     Rate and Rhythm: Regular rhythm. Tachycardia present.     Pulses: Normal pulses.     Heart sounds: Normal heart sounds. No murmur.     Comments: Tachycardia Pulmonary:     Effort: Pulmonary effort is normal.     Breath sounds: No decreased air movement. Decreased breath sounds (Diffuse distant breath sounds) present. No wheezing, rhonchi or rales.  Abdominal:     General: Abdomen is flat. There is no distension.     Palpations: Abdomen is soft.  Musculoskeletal: Normal range of motion.        General: No swelling.     Right lower leg: No edema.     Left lower leg: No edema.  Lymphadenopathy:     Cervical: No cervical adenopathy.  Skin:    General: Skin is warm and dry.     Coloration: Skin is not pale.  Neurological:     General: No focal deficit present.     Mental Status: She is alert and oriented to person, place, and time.  Psychiatric:        Mood and Affect: Mood normal.        Behavior: Behavior normal.     These are results from pulmonary function testing performed by Dr. Ned Clines on 06 June 2016: SPIROMETRY: FVC was 2.65 liters, 98% of predicted/Post 2.72, 100%, 3% Change FEV1 was 1.17, 53% of predicted/Post 1.46, 66%, 25% Change FEV1 ratio was 44/Post 54 FEF 25-75% liters per second was 16% of predicted/Post 25%, 56% Change *SVN given with 2.5 mg Albuterol for Post Spirometry.  LUNG VOLUMES: TLC was 94% of predicted RV was 92% of predicted  DIFFUSION CAPACITY: DLCO was 81% of predicted DLCO/VA was 119% of predicted  FLOW VOLUME LOOP: Very delayed expiratory flow volume  loop  Impression Moderate-severe obstruction, probable bronchodilator effect Lung volume and diffusion capacity are in low normal range Interpreted by Dr. Ned Clines.  Today's spirometry shows an FEV1 of 1.3 L or 57% predicted with an FVC of 2.1 L or 73% predicted.  Her FEV1/FVC is 60%.  FEF 25-75% is 33%.  This is consistent with moderate obstruction.  There is decrease in FVC which could indicate a concomitant restrictive process versus air trapping.  The patient will need formal PFTs at a later time.  This is my interpretation.  Assessment & Plan:   1.  Cough: This is likely multifactorial.  Suspect that she continues to have issues with significant postnasal drip  and irritation of the upper airway (upper airway cough syndrome) reflux may or may not be playing a part.  Patient however has significant obstruction and appears not to be well compensated with regards to asthma/COPD overlap syndrome.  Please see management as below.  2.  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  He is not well compensated.  We will discontinue Qvar and start her on Symbicort 160/4.5, 2 inhalations twice a day.  She will use a spacer for delivery.  She will rinse her mouth well after use.  She may still use Pro-Air (Albuterol) in between ,  up to 4 times a day if needed.  I suspect that her need for this medication will decrease significantly.   3.  Allergic rhinitis appears to be mostly perennial, she has multiple and still unknown triggers: Will obtain IgE and RAST.  She may require allergy evaluation.  4.  Morbid obesity (BMI 40): Weight loss is advised.  This issue adds complexity to her management and may be aggravating her issues above.   The patient is a remote former smoker given that she has quit smoking over 20 years, and she does not meet the criteria for low-dose cancer screening.  Thank you for allowing us to participate in this patient's care.

## 2018-11-24 NOTE — Progress Notes (Signed)
Patient seen in the office today and instructed on use of Symbicort 160.  Patient expressed understanding and demonstrated technique. 

## 2018-11-24 NOTE — Addendum Note (Signed)
Addended by: Erlinda HongWIGGINS, Amillya Chavira on: 11/24/2018 04:28 PM   Modules accepted: Orders

## 2018-11-24 NOTE — Addendum Note (Signed)
Addended by: Erlinda HongWIGGINS, Kasyn Stouffer on: 11/24/2018 03:54 PM   Modules accepted: Orders

## 2018-11-24 NOTE — Patient Instructions (Signed)
1.  Your inhaler (Qvar) will be changed to Symbicort 160/4.5, 2 inhalations twice a day.  Was with spacer.  Rinse your mouth well after use.  You may continue to use Pro-Air if needed in between times.  2.  We will check an allergy panel (blood test)  3.  We will see you  in follow-up in 4 to 6 weeks time.

## 2018-11-27 ENCOUNTER — Ambulatory Visit: Payer: Medicare Other

## 2018-11-27 DIAGNOSIS — R2681 Unsteadiness on feet: Secondary | ICD-10-CM

## 2018-11-27 DIAGNOSIS — R42 Dizziness and giddiness: Secondary | ICD-10-CM | POA: Diagnosis not present

## 2018-11-27 LAB — ALLERGENS W/TOTAL IGE AREA 2
Alternaria Alternata IgE: <0.1 kU/L
Cedar, Mountain IgE: <0.1 kU/L
Cockroach, German IgE: <0.1 kU/L
D Farinae IgE: <0.1 kU/L
D Pteronyssinus IgE: <0.1 kU/L
IGE (IMMUNOGLOBULIN E), SERUM: 6 [IU]/mL (ref 6–495)
Maple/Box Elder IgE: <0.1 kU/L
Mouse Urine IgE: <0.1 kU/L
Oak, White IgE: <0.1 kU/L
Pigweed, Rough IgE: <0.1 kU/L
Sheep Sorrel IgE Qn: <0.1 kU/L

## 2018-11-27 NOTE — Therapy (Signed)
[place] [facility] SERVICES [address] [place], [place], [address] Phone: [phone]   Fax:  [phone]  Physical Therapy Treatment/Discharge  Dates of reporting period  10/05/18   to   11/27/18  Patient Details  Name: Rhonda Oconnell MRN: [id number] Date of Birth: 1951/10/05 Referring Provider (PT): Dr. Manuella Ghazi   Encounter Date: 11/27/2018  PT End of Session - 11/27/18 1039    Visit Number  7    Number of Visits  9    Date for PT Re-Evaluation  11/30/18    Authorization Type  Progress note: 7/10, goals performed 11/27/18    PT Start Time  1036    PT Stop Time  1120    PT Time Calculation (min)  44 min    Equipment Utilized During Treatment  Gait belt    Activity Tolerance  Patient tolerated treatment well    Behavior During Therapy  WFL for tasks assessed/performed       Past Medical History:  Diagnosis Date  . Abdominal hernia   . Allergic state   . Anemia   . Arthritis   . Asthma   . Cataract cortical, senile   . COPD (chronic obstructive pulmonary disease) ([place])   . Dysphagia   . GERD (gastroesophageal reflux disease)   . Hemorrhoids   . Hyperlipidemia   . Hypertension   . Rectal tear   . Rectocele     Past Surgical History:  Procedure Laterality Date  . ABDOMINAL HYSTERECTOMY    . ARTHOSCOPIC ROTAOR CUFF REPAIR    . BLADDER REPAIR    . CATARACT EXTRACTION    . CHOLECYSTECTOMY    . COLONOSCOPY WITH PROPOFOL N/A 07/22/2016   Procedure: COLONOSCOPY WITH PROPOFOL;  Surgeon: Manya Silvas, MD;  Location: [facility] ENDOSCOPY;  Service: Endoscopy;  Laterality: N/A;  . ESOPHAGOGASTRODUODENOSCOPY (EGD) WITH PROPOFOL N/A 07/22/2016   Procedure: ESOPHAGOGASTRODUODENOSCOPY (EGD) WITH PROPOFOL;  Surgeon: Manya Silvas, MD;  Location: [facility] ENDOSCOPY;  Service: Endoscopy;  Laterality: N/A;  . HEMORRHOID SURGERY    . HERNIA REPAIR    . LAPAROSCOPIC SPLENECTOMY    . TUBAL LIGATION      There were no vitals filed for this  visit.  Subjective Assessment - 11/27/18 1039    Subjective  Pt states that Friday night after the session she rolled over to her right and had a severe bout of vertigo that lasted a few minutes. She had continued episodes which gradually improved throughout Saturday and by Sunday she felt well. She has felt well all week and has had no further episodes. She reports approximately 95% improvement in her symptoms since starting therapy.     Pertinent History  Pt states that on Apr 27, 2018 she woke up and tried to go to the bathroom and almost fell on her face. Pt states that the severe symptoms lasted for a couple days and it gradually improved but never fully resolved. In June she had another bout and she went to see her PCP who prescribed her meclizine. She does not believe that she was provided a steroid at that time but does not recall. She was referred to [facility] ENT who performed a VNG study as well as an audiogram. Pt states that everything was negative and her hearing is excellent. She was referred to neurology who ordered an MRI which was negative with the exception of increased fluid in her sinuses. She saw Marcie Bal PA-C who prescribed her amoxicillin and prednisone  for a presumed sinus infection. She reports that her sinus pressure has improved significantly since starting these medications. However she reports that the morning of 09/23/18 she woke up and had severe dizziness again. She states that her symptoms lasted for a whole week. She had the same problem in 1997 and it eventually self-resolved. She has been taking two Excedrin migraine in the morning since starting the Prednisone because it causes headaches for her. Around the time the dizziness started in May 2019 she also started to get headaches. Her headaches are bilateral and start in the occipital region and then spread to the top of her head. Pt states that she gets blurring of her vision during these episodes. She has been  diagnosed with ocular migraines and she typically experiences narrowing of her vision even to the point of complete visual loss. She had 3 to 4 ocular migraines around the time the dizziness started in May and she reports that prior to that it had been a long time since she had a migraine. Pt reports 2 ocular migraines in the last 2 weeks.     Diagnostic tests  MRI WNL except for fluid in her sinuses    Patient Stated Goals  "I want to move better" "get rid of dizziness" "to be able to take care of grandchildren and great grandchildren"    Currently in Pain?  No/denies       95% improvement in symptoms since starting therapy.   [facility] PT Assessment - 11/27/18 1106      Berg Balance Test   Sit to Stand  Able to stand without using hands and stabilize independently    Standing Unsupported  Able to stand safely 2 minutes    Sitting with Back Unsupported but Feet Supported on Floor or Stool  Able to sit safely and securely 2 minutes    Stand to Sit  Sits safely with minimal use of hands    Transfers  Able to transfer safely, minor use of hands    Standing Unsupported with Eyes Closed  Able to stand 10 seconds safely    Standing Ubsupported with Feet Together  Able to place feet together independently and stand 1 minute safely    From Standing, Reach Forward with Outstretched Arm  Can reach confidently >25 cm (10")    From Standing Position, Pick up Object from Floor  Able to pick up shoe safely and easily    From Standing Position, Turn to Look Behind Over each Shoulder  Looks behind from both sides and weight shifts well    Turn 360 Degrees  Able to turn 360 degrees safely in 4 seconds or less    Standing Unsupported, Alternately Place Feet on Step/Stool  Able to stand independently and safely and complete 8 steps in 20 seconds    Standing Unsupported, One Foot in Front  Able to place foot tandem independently and hold 30 seconds    Standing on One Leg  Able to lift leg independently and hold  5-10 seconds    Total Score  55      Dynamic Gait Index   Level Surface  Normal    Change in Gait Speed  Normal    Gait with Horizontal Head Turns  Normal    Gait with Vertical Head Turns  Normal    Gait and Pivot Turn  Normal    Step Over Obstacle  Normal    Step Around Obstacles  Normal    Steps  Normal  Total Score  24          TREATMENT   Neuromuscular Re-education Performed Dix-Hallpike and Roll Test with patient which are all negative for vertigo and nystagmus bilaterally. Utilized inverted mat table to improve extension of head with respect to horizontal. Performed outcome measures with patient including BERG (55/56) and DGI (24/24); Pt completed ABC and DHI (unbilled); Updated goals with patient and discussed plan of care and discharge. Pt provided tour of cardiopulmonary rehab and [facility]. Discussed different membership options and well as the benefit of pulmonary rehab with respect to her COPD.    Pt demonstrates significant improvement in her symptoms since starting therapy. She reports approximately 95% improvement. Her BERG increased from 52/56 to 55/56 and her DGI improved from 17/24 to 24/24. Her ABC improved from 45% to 83.75% and her [place] decreased from 68/100 to 32/100. Pt states that the only reason her [place] is still increased is because the answers she marked "sometimes" are actually "no" but she doesn't want to say "no" until her symptoms stayed improved for longer. Pt is being discharged on this date having met all of her goals and reporting 95% improvement in her symptoms. Pt provided tour of cardiopulmonary rehab and [facility]. Discussed different membership options and well as the benefit of pulmonary rehab with respect to her COPD. She plans to follow-up with her pulmonologist regarding pulmonary rehab.                             PT Short Term Goals - 11/27/18 1054      PT SHORT TERM GOAL #1   Title  Pt will be  independent with HEP in order to improve strength, balance, and dizziness in order to decrease fall risk and improve function at home.    Baseline  11/20/18: Performing regularly without issue    Time  4    Period  Weeks    Status  Achieved        PT Long Term Goals - 11/27/18 1054      PT LONG TERM GOAL #1   Title  Pt will improve BERG by at least 3 points in order to demonstrate clinically significant improvement in balance.      Baseline  10/05/18: 52/56, 11/27/18: 55/56    Time  8    Period  Weeks    Status  Achieved      PT LONG TERM GOAL #2   Title  Pt will improve DGI by at least 3 points in order to demonstrate clinically significant improvement in balance and decreased risk for falls.    Baseline  10/05/18: 17/24; 11/27/18: 24/24    Time  8    Period  Weeks    Status  Achieved      PT LONG TERM GOAL #3   Title  Pt will improve ABC by at least 13% in order to demonstrate clinically significant improvement in balance confidence.     Baseline  10/05/18: 45%; 11/27/18: 83.75%    Time  8    Period  Weeks    Status  Achieved      PT LONG TERM GOAL #4   Title  Pt will decrease DHI score by at least 18 points in order to demonstrate clinically significant reduction in disability     Baseline  10/05/18: 68/100; 11/27/18: 32/100    Time  8    Period  [place]  Status  Achieved            Plan - 11/27/18 1039    Clinical Impression Statement  Pt demonstrates significant improvement in her symptoms since starting therapy. She reports approximately 95% improvement. Her BERG increased from 52/56 to 55/56 and her DGI improved from 17/24 to 24/24. Her ABC improved from 45% to 83.75% and her [place] decreased from 68/100 to 32/100. Pt states that the only reason her [place] is still increased is because the answers she marked "sometimes" are actually "no" but she doesn't want to say "no" until her symptoms stayed improved for longer. Pt is being discharged on this date having met all of  her goals and reporting 95% improvement in her symptoms. Pt provided tour of cardiopulmonary rehab and [facility]. Discussed different membership options and well as the benefit of pulmonary rehab with respect to her COPD. She plans to follow-up with her pulmonologist regarding pulmonary rehab.    Rehab Potential  Good    PT Frequency  1x / week    PT Duration  8 weeks    PT Treatment/Interventions  Electrical Stimulation;ADLs/Self Care Home Management;Aquatic Therapy;Biofeedback;Canalith Repostioning;Cryotherapy;Iontophoresis 6m/ml Dexamethasone;Moist Heat;Traction;Ultrasound;DME Instruction;Gait training;Stair training;Functional mobility training;Therapeutic exercise;Balance training;Neuromuscular re-education;Patient/family education;Manual techniques;Passive range of motion;Dry needling;Vestibular    PT Next Visit Plan  Discharge    PT Home Exercise Plan  VOR x 1 horizontal standing with feet together and conflicting background, tandem balance with horizontal and vertical head turns, tandem gait next to counter, ambulation with head turns in hallway    Consulted and Agree with Plan of Care  Patient       Patient will benefit from skilled therapeutic intervention in order to improve the following deficits and impairments:  Abnormal gait, Decreased balance, Dizziness, Difficulty walking  Visit Diagnosis: Dizziness and giddiness  Unsteadiness on feet     Problem List Patient Active Problem List   Diagnosis Date Noted  . Morbid obesity with BMI of 40.0-44.9, adult ([place] 11/24/2018  . Asthma[place]COPD overlap syndrome ([place] 09/24/2016  . Varicose veins of both lower extremities with pain 09/24/2016  . 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 Benign essential hypertension 04/14/2014  . Other and unspecified hyperlipidemia 04/14/2014  . Allergic rhinitis 04/14/2014  . Esophageal reflux 04/14/2014   JPhillips GroutPT, DPT, GCS  Huprich,Jason 11/28/2018, 3:30 PM  [facility] [address] [place] [address]Phone: [phone]  Fax:  [phone] Name: EYUDITH NORLANDERMRN: [id number] of Birth: [birth year]

## 2018-12-28 ENCOUNTER — Ambulatory Visit: Payer: Medicare Other | Admitting: Pulmonary Disease

## 2018-12-29 ENCOUNTER — Ambulatory Visit (INDEPENDENT_AMBULATORY_CARE_PROVIDER_SITE_OTHER): Payer: Medicare Other | Admitting: Pulmonary Disease

## 2018-12-29 ENCOUNTER — Encounter: Payer: Self-pay | Admitting: Pulmonary Disease

## 2018-12-29 VITALS — BP 138/84 | HR 72 | Ht 62.0 in | Wt 221.2 lb

## 2018-12-29 DIAGNOSIS — J449 Chronic obstructive pulmonary disease, unspecified: Secondary | ICD-10-CM

## 2018-12-29 DIAGNOSIS — K219 Gastro-esophageal reflux disease without esophagitis: Secondary | ICD-10-CM | POA: Diagnosis not present

## 2018-12-29 DIAGNOSIS — Z6841 Body Mass Index (BMI) 40.0 and over, adult: Secondary | ICD-10-CM

## 2018-12-29 DIAGNOSIS — J0191 Acute recurrent sinusitis, unspecified: Secondary | ICD-10-CM

## 2018-12-29 NOTE — Progress Notes (Signed)
Subjective:    Patient ID: Rhonda Oconnell, female    DOB: [birth year], 68 y.o.   MRN: [id number]  HPI This is a 68 year old remote former smoker who presents for follow-up with the issues of a cough. 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  She was first evaluated here on 17 December with a cough that developed after a bout with sinusitis.  She was also noted to have some laryngopharyngeal reflux issues.  She is being followed by [facility].  At her initial visit here she was started on Symbicort 160/4.5 and she notes that this has helped her symptoms markedly but she still has an occasional cough.  However overall this is markedly decreased.  She notes improvement in her sinus symptoms and that she has no congestion postnasal drip is markedly improved and no sinus pressure is noted.  As noted cough has improved significantly.  She has not had any fevers, chills or sweats.  No sputum production.  No hemoptysis.   Review of Systems  Constitutional: Negative.   HENT: Positive for postnasal drip (Persistent but markedly improved). Negative for congestion, sinus pressure and sinus pain.   Eyes: Negative.   Respiratory: Positive for cough (Improved).   Cardiovascular: Negative.   Gastrointestinal: Negative.   Endocrine: Negative.   Genitourinary: Negative.   Musculoskeletal: Positive for arthralgias (Chronic).  Skin: Negative.   Allergic/Immunologic: Negative.   Neurological: Negative.   Psychiatric/Behavioral: Negative.   All other systems reviewed and are negative.      Objective:   Physical Exam Vitals signs reviewed.  Constitutional:      General: She is not in acute distress.    Appearance: Normal appearance. She is obese. She is not ill-appearing.  HENT:     Head: Normocephalic and atraumatic.     Right Ear: External ear normal.     Left Ear: External ear normal.     Nose: No congestion.     Mouth/Throat:     Mouth: Mucous membranes are moist.     Pharynx: Oropharynx  is clear.     Comments: Some mild clear postnasal drip noted Eyes:     General: No scleral icterus.    Extraocular Movements: Extraocular movements intact.     Conjunctiva/sclera: Conjunctivae normal.  Neck:     Musculoskeletal: Normal range of motion and neck supple.  Cardiovascular:     Rate and Rhythm: Normal rate and regular rhythm.     Pulses: Normal pulses.     Heart sounds: Normal heart sounds.  Pulmonary:     Effort: Pulmonary effort is normal.     Breath sounds: Normal breath sounds. No wheezing.  Abdominal:     General: Abdomen is protuberant.  Musculoskeletal: Normal range of motion.     Right lower leg: No edema.     Left lower leg: No edema.  Skin:    General: Skin is warm and dry.  Neurological:     General: No focal deficit present.     Mental Status: She is alert and oriented to person, place, and time.  Psychiatric:        Mood and Affect: Mood normal.        Behavior: Behavior normal.           Assessment & Plan:  1.  Cough: This is improving with management of sinusitis and laryngo esophageal reflux.  She also has improved with the use of Symbicort.  We will see her back in 8 weeks time she is  to contact us prior to that time should any new difficulties arise.  2.  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  We will order full PFTs.  Continue Symbicort for now.  Will not add any additional medications unless indicated by PFT.   3.  Allergic rhinitis appears to be mostly perennial, she has multiple and still unknown triggers:  IgE and RAST were negative.  Suspect it is perennial nonallergic rhinitis due to unknown triggers.  She also need to continue follow-up for her recurrent sinusitis issues.  4.  LPR: Continue omeprazole.  5.  Morbid obesity (BMI 40): Weight loss is advised.  This issue adds complexity to her management and may be aggravating her issues above.

## 2018-12-29 NOTE — Patient Instructions (Signed)
1.  We will see him in follow-up in 6 to 8 weeks.  2.  Make sure you check with your ENT with regards to the sinusitis.  3.  We are going to schedule breathing test prior to the follow-up with [place].

## 2019-01-04 ENCOUNTER — Encounter: Payer: Self-pay | Admitting: Pulmonary Disease

## 2019-01-04 DIAGNOSIS — K219 Gastro-esophageal reflux disease without esophagitis: Secondary | ICD-10-CM | POA: Insufficient documentation

## 2019-01-04 DIAGNOSIS — J0191 Acute recurrent sinusitis, unspecified: Secondary | ICD-10-CM | POA: Insufficient documentation

## 2019-01-28 ENCOUNTER — Ambulatory Visit: Payer: Medicare Other | Attending: Pulmonary Disease

## 2019-01-28 DIAGNOSIS — Z87891 Personal history of nicotine dependence: Secondary | ICD-10-CM | POA: Diagnosis not present

## 2019-01-28 DIAGNOSIS — J449 Chronic obstructive pulmonary disease, unspecified: Secondary | ICD-10-CM | POA: Insufficient documentation

## 2019-01-28 MED ORDER — ALBUTEROL SULFATE (2.5 MG/3ML) 0.083% IN NEBU
2.5000 mg | INHALATION_SOLUTION | Freq: Once | RESPIRATORY_TRACT | Status: AC
  Administered 2019-01-28: 2.5 mg via RESPIRATORY_TRACT
  Filled 2019-01-28: qty 3

## 2019-02-04 ENCOUNTER — Ambulatory Visit (INDEPENDENT_AMBULATORY_CARE_PROVIDER_SITE_OTHER): Payer: Medicare Other | Admitting: Pulmonary Disease

## 2019-02-04 ENCOUNTER — Telehealth: Payer: Self-pay | Admitting: *Deleted

## 2019-02-04 VITALS — BP 136/88 | HR 72 | Resp 16 | Ht 62.0 in | Wt 226.0 lb

## 2019-02-04 DIAGNOSIS — J310 Chronic rhinitis: Secondary | ICD-10-CM

## 2019-02-04 DIAGNOSIS — R059 Cough, unspecified: Secondary | ICD-10-CM

## 2019-02-04 DIAGNOSIS — R05 Cough: Secondary | ICD-10-CM | POA: Diagnosis not present

## 2019-02-04 DIAGNOSIS — J449 Chronic obstructive pulmonary disease, unspecified: Secondary | ICD-10-CM

## 2019-02-04 DIAGNOSIS — K219 Gastro-esophageal reflux disease without esophagitis: Secondary | ICD-10-CM

## 2019-02-04 DIAGNOSIS — Z6841 Body Mass Index (BMI) 40.0 and over, adult: Secondary | ICD-10-CM

## 2019-02-04 NOTE — Telephone Encounter (Signed)
Noted. Will let Dr. Jayme Cloud know.

## 2019-02-04 NOTE — Telephone Encounter (Signed)
Received referral for initial lung cancer screening scan. Contacted patient and obtained smoking history, which includes a quit date of 1994. Patient understands that she does not meet criteria with current recommendations due to quit date > 15 years.

## 2019-02-04 NOTE — Progress Notes (Signed)
Subjective:    Patient ID: Rhonda Oconnell, female    DOB: 01-Oct-1951, 68 y.o.   MRN: [id number]  HPI This is a 68 year old remote former smoker who presents for follow-up with the issues of a cough. 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  She was first evaluated here on 17 December with a cough that developed after a bout with sinusitis.  She was also noted to have some laryngopharyngeal reflux issues.  She is being followed by [facility].  At her initial visit here she was started on Symbicort 160/4.5 and she notes that this has helped her symptoms markedly but she still has an occasional cough. However overall this is markedly decreased.  Though she continues to have some cough, this is markedly improved and she notes that this is almost nonexistent.  She is doing well with Symbicort.   Review of Systems  Constitutional: Negative.   HENT: Positive for postnasal drip.   Respiratory: Positive for cough (Overall markedly improved).   Cardiovascular: Negative.   Gastrointestinal:       Occasional reflux symptoms.  Endocrine: Negative.   Musculoskeletal: Negative.   Allergic/Immunologic: Positive for environmental allergies.  Neurological: Negative.   All other systems reviewed and are negative.      Objective:   Physical Exam Vitals signs and nursing note reviewed.  Constitutional:      General: She is not in acute distress.    Appearance: Normal appearance. She is obese. She is not ill-appearing.  HENT:     Head: Normocephalic and atraumatic.     Right Ear: External ear normal.     Left Ear: External ear normal.     Nose: Mucosal edema (Mild) present.     Right Turbinates: Swollen (Mild).     Left Turbinates: Swollen (Mild).     Mouth/Throat:     Mouth: Mucous membranes are moist.     Pharynx: Oropharynx is clear.     Comments: Some mild clear postnasal drip noted Eyes:     General: No scleral icterus.    Conjunctiva/sclera: Conjunctivae normal.  Neck:   Musculoskeletal: Neck supple.  Cardiovascular:     Rate and Rhythm: Normal rate and regular rhythm.     Pulses: Normal pulses.     Heart sounds: Normal heart sounds.  Pulmonary:     Effort: Pulmonary effort is normal.     Breath sounds: Normal breath sounds. No wheezing.  Abdominal:     General: Abdomen is protuberant.  Musculoskeletal:     Right lower leg: No edema.     Left lower leg: No edema.  Skin:    General: Skin is warm and dry.  Neurological:     General: No focal deficit present.     Mental Status: She is alert and oriented to person, place, and time.  Psychiatric:        Mood and Affect: Mood normal.        Behavior: Behavior normal.           Assessment & Plan:  1. Cough: This is improving with management of sinusitis and laryngo esophageal reflux.  She also has improved with the use of Symbicort.   Continue the same regimen.  Will be seen in follow-up in 2 months time she is to contact us prior to that time should any new difficulties arise.  2. 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    This was confirmed by PFTs performed on 21 February.  These were discussed with  the patient.  She was noted to have some hyperinflation and air trapping may need to consider adding LAMA to her current regimen.  We will revisit this on follow-up appointment.  3. Perennial nonallergic rhinitis: IgE and RAST were negative.  Suspect it is perennial nonallergic rhinitis due to unknown triggers.    Continue ENT follow-up and nasal hygiene.    4.  LPR: Continue omeprazole.  5. Morbid obesity (BMI 41.3):Weight loss is advised. This issue adds complexity to her management and may be aggravating her issues above.   This chart was dictated using voice recognition software/Dragon.  Despite best efforts to proofread, errors can occur which can change the meaning.  Any change was purely unintentional.

## 2019-02-04 NOTE — Patient Instructions (Signed)
1.  We will get you back set up with the lung cancer screening.  2.  We will see you back in follow-up in 2 months time.

## 2019-02-04 NOTE — Progress Notes (Deleted)
   Subjective:    Patient ID: Rhonda Oconnell, female    DOB: 1951/03/04, 68 y.o.   MRN: [id number]  HPI    Review of Systems     Objective:   Physical Exam        Assessment & Plan:

## 2019-02-05 ENCOUNTER — Encounter: Payer: Self-pay | Admitting: Pulmonary Disease

## 2019-03-11 ENCOUNTER — Telehealth: Payer: Self-pay | Admitting: Pulmonary Disease

## 2019-03-11 NOTE — Telephone Encounter (Signed)
Called and spoke to patient, she was inquiring about the spacer that she received at the last visit and whether it was filed through insurance. Rhonda called Melissa from [facility] and confirmed that if a patient gets a bill from [facility], then it has been run through insurance. Patient is aware.

## 2019-04-07 ENCOUNTER — Ambulatory Visit (INDEPENDENT_AMBULATORY_CARE_PROVIDER_SITE_OTHER): Payer: Medicare Other | Admitting: Pulmonary Disease

## 2019-04-07 ENCOUNTER — Encounter: Payer: Self-pay | Admitting: Pulmonary Disease

## 2019-04-07 ENCOUNTER — Other Ambulatory Visit: Payer: Self-pay

## 2019-04-07 VITALS — BP 144/98 | HR 95 | Ht 62.0 in | Wt 227.0 lb

## 2019-04-07 DIAGNOSIS — J310 Chronic rhinitis: Secondary | ICD-10-CM

## 2019-04-07 DIAGNOSIS — R059 Cough, unspecified: Secondary | ICD-10-CM

## 2019-04-07 DIAGNOSIS — J449 Chronic obstructive pulmonary disease, unspecified: Secondary | ICD-10-CM | POA: Diagnosis not present

## 2019-04-07 DIAGNOSIS — R05 Cough: Secondary | ICD-10-CM

## 2019-04-07 DIAGNOSIS — K219 Gastro-esophageal reflux disease without esophagitis: Secondary | ICD-10-CM

## 2019-04-07 DIAGNOSIS — Z6841 Body Mass Index (BMI) 40.0 and over, adult: Secondary | ICD-10-CM

## 2019-04-07 MED ORDER — FLUTICASONE-UMECLIDIN-VILANT 100-62.5-25 MCG/INH IN AEPB: 1.0000 | INHALATION_SPRAY | Freq: Every day | RESPIRATORY_TRACT | 11 refills | Status: DC

## 2019-04-07 MED ORDER — FLUTICASONE-UMECLIDIN-VILANT 100-62.5-25 MCG/INH IN AEPB: 1.0000 | INHALATION_SPRAY | Freq: Every day | RESPIRATORY_TRACT | 0 refills | Status: AC

## 2019-04-07 NOTE — Patient Instructions (Signed)
We will see you in follow-up in 3 months time please call sooner if any new issues arise.  Your Symbicort will be switched to Trelegy Ellipta this has an extra medication.  Prescriptions have been sent to pharmacy.

## 2019-04-07 NOTE — Progress Notes (Signed)
Subjective:    Patient ID: Rhonda Oconnell, female    DOB: November 09, 1951, 68 y.o.   MRN: [id number]  HPI  This is a 68 year old remote former smoker who presents for follow-up with the issues of a cough. 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  She was first evaluated here on 17 December with a cough that developed after a bout with sinusitis.  She was also noted to have some laryngopharyngeal reflux issues.  She is being followed by [facility].  At her initial visit here she was started on Symbicort 160/4.5 and she notes that this has helped her symptoms markedly but she still has an occasional cough.  Her cough is triggered mostly by strong odors and changes in temperature these are things that aggravate her postnasal drip and sinus issues.  During her last visit of 27 February, we advised her that she may benefit from adding LAMA to her regimen.  She states that her gastroesophageal reflux is well controlled.  Again her main issues remain upper airway issues related to postnasal drip.  She has had some mild breathlessness though overall this has been better since she has been on Symbicort.  Lately she has been using her albuterol inhaler twice a day due to "allergies".  She notes that this relieves her symptoms.  She is not on an ACE inhibitor however she is on an ARB.    Review of Systems  Constitutional: Negative.   HENT: Positive for postnasal drip.   Respiratory: Positive for cough (Overall markedly improved).   Cardiovascular: Negative.   Gastrointestinal: Negative.        Rare reflux symptoms.  Endocrine: Negative.   Musculoskeletal: Negative.   Allergic/Immunologic: Positive for environmental allergies.  Neurological: Negative.   All other systems reviewed and are negative.      Objective:   Physical Exam Vitals signs and nursing note reviewed.  Constitutional:      General: She is not in acute distress.    Appearance: Normal appearance. She is obese. She is not  ill-appearing.  HENT:     Head: Normocephalic and atraumatic.     Right Ear: External ear normal.     Left Ear: External ear normal.     Nose: Mucosal edema (Mild) present.     Right Turbinates: Swollen (Mild).     Left Turbinates: Swollen (Mild).     Mouth/Throat:     Mouth: Mucous membranes are moist.     Pharynx: Oropharynx is clear.     Comments: Some mild clear postnasal drip noted Eyes:     General: No scleral icterus.    Conjunctiva/sclera: Conjunctivae normal.  Neck:     Musculoskeletal: Neck supple.  Cardiovascular:     Rate and Rhythm: Normal rate and regular rhythm.     Pulses: Normal pulses.     Heart sounds: Normal heart sounds.  Pulmonary:     Effort: Pulmonary effort is normal.     Breath sounds: Normal breath sounds. No wheezing.  Abdominal:     General: Abdomen is protuberant.  Musculoskeletal:     Right lower leg: No edema.     Left lower leg: No edema.  Skin:    General: Skin is warm and dry.  Neurological:     General: No focal deficit present.     Mental Status: She is alert and oriented to person, place, and time.  Psychiatric:        Mood and Affect: Mood normal.  Behavior: Behavior normal.           Assessment & Plan:    1. 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    This was confirmed by PFTs performed on 21 February. She was noted to have some hyperinflation and air trapping she is a candidate to add LAMA to her current regimen.  The most cost effective change in her medication would be to switch her to Trelegy Ellipta as Spiriva is not covered by her current insurance plan.  This would add extra cost to her current regimen.  Therefore she will be switched to Trelegy Ellipta 1 inhalation daily.  2. Cough:  Has issues with laryngopharyngeal reflux with these issues have been controlled recently.  Her main issue seems to be related to postnasal drip and sinus issues.  Overall this has improved however she still has breakthrough  episodes.  Addition of LAMA may be full in this situation.  3. Perennial nonallergic rhinitis: IgE and RAST were negative.  Suspect it is perennial nonallergic rhinitis due to unknown triggers.  She does appear to have some seasonal variation but still unknown [place] triggers.  She does note triggers by various odors.   Continue ENT follow-up and nasal hygiene.    4.  LPR: Continue omeprazole.  5. Morbid obesity (BMI 41.5):Weight loss is advised. This issue adds complexity to her management and may be aggravating her issues above.   This chart was dictated using voice recognition software/Dragon.  Despite best efforts to proofread, errors can occur which can change the meaning.  Any change was purely unintentional.

## 2019-07-06 ENCOUNTER — Telehealth: Payer: Self-pay | Admitting: Pulmonary Disease

## 2019-07-06 NOTE — Telephone Encounter (Signed)

## 2019-07-07 ENCOUNTER — Ambulatory Visit: Payer: Medicare Other | Admitting: Pulmonary Disease

## 2019-07-07 ENCOUNTER — Ambulatory Visit (INDEPENDENT_AMBULATORY_CARE_PROVIDER_SITE_OTHER): Payer: Medicare Other | Admitting: Pulmonary Disease

## 2019-07-07 ENCOUNTER — Other Ambulatory Visit: Payer: Self-pay

## 2019-07-07 ENCOUNTER — Encounter: Payer: Self-pay | Admitting: Pulmonary Disease

## 2019-07-07 VITALS — BP 130/94 | HR 92 | Temp 98.1°F | Ht 62.0 in | Wt 227.8 lb

## 2019-07-07 DIAGNOSIS — Z6841 Body Mass Index (BMI) 40.0 and over, adult: Secondary | ICD-10-CM

## 2019-07-07 DIAGNOSIS — R911 Solitary pulmonary nodule: Secondary | ICD-10-CM | POA: Diagnosis not present

## 2019-07-07 DIAGNOSIS — J449 Chronic obstructive pulmonary disease, unspecified: Secondary | ICD-10-CM

## 2019-07-07 DIAGNOSIS — R05 Cough: Secondary | ICD-10-CM

## 2019-07-07 DIAGNOSIS — R059 Cough, unspecified: Secondary | ICD-10-CM

## 2019-07-07 DIAGNOSIS — J310 Chronic rhinitis: Secondary | ICD-10-CM

## 2019-07-07 NOTE — Patient Instructions (Signed)
1.  Will obtain CT scan of the chest without contrast to evaluate your issues with cough and previous issues of lung nodules.  2.  Continue Trelegy 1 inhalation daily.  3.  We will see you in follow-up in 3 months time.  Call sooner should any new difficulties arise.  4.  If your cough continues, we may need to consider having your Cozaar switched to a different medication.  Though this medication usually does not cause as many issues with cough as lisinopril, it still can trigger cough in certain individuals.  5.  We will call you with results as soon as the CT scan results are available.

## 2019-07-07 NOTE — Progress Notes (Signed)
Subjective:    Patient ID: Rhonda Oconnell, female    DOB: [birth year], 68 y.o.   MRN: [id number]  HPI This is a 68 year old remote former smoker who presents for follow-up on cough.  She has asthma/COPD overlap syndrome.  Initially evaluated here on 24 November 2018 at that time her cough issues developed after a bout with sinusitis.  She has issues with laryngopharyngeal reflux.  She is followed by [facility].  She has been using Trelegy Ellipta and notes that this has helped with her shortness of breath.  However she continues to have issues with cough.  These issues have gotten better however over the last several weeks he has noted worsening.  Cough is nonproductive.  No hemoptysis noted.  She has had no fevers, chills or sweats.  No orthopnea or paroxysmal nocturnal dyspnea.  No lower extremity edema.  She feels that she is having some breakthrough issues with gastroesophageal reflux.  She continues to have significant issues with upper airway congestion and postnasal drip.  Previously she had had difficulties with ACE inhibitors and that these aggravated cough.  She is on an ARB.  Please note discussion as below.  She does not endorse any other concerns today.  We reviewed her imaging and she has not had recent imaging last chest imaging was in 2011.  Review of Systems  Constitutional: Negative.   HENT: Positive for postnasal drip.   Eyes: Negative.   Respiratory: Positive for cough (Nonproductive).   Cardiovascular: Negative.   Gastrointestinal: Negative.        Reflux symptoms have worsened.  Endocrine: Negative.   Genitourinary: Negative.   Musculoskeletal: Negative.   Skin: Negative.   Allergic/Immunologic: Positive for environmental allergies.  Neurological: Negative.   Hematological: Negative.   Psychiatric/Behavioral: Negative.   All other systems reviewed and are negative.      Objective:   Physical Exam Vitals signs and nursing note reviewed.  Constitutional:    General: She is not in acute distress.    Appearance: Normal appearance. She is obese. She is not ill-appearing.  HENT:     Head: Normocephalic and atraumatic.     Nose: Mucosal edema (Mild) present.     Right Turbinates: Swollen (Mild).     Left Turbinates: Swollen (Mild).     Mouth/Throat:     Comments: Nose/mouth/throat not examined due to masking requirements for COVID 19. Eyes:     General: No scleral icterus. Neck:     Musculoskeletal: Neck supple.  Cardiovascular:     Rate and Rhythm: Normal rate and regular rhythm.     Pulses: Normal pulses.     Heart sounds: Normal heart sounds.  Pulmonary:     Effort: Pulmonary effort is normal.     Breath sounds: Normal breath sounds. No wheezing.  Abdominal:     General: Abdomen is protuberant. There is no distension.  Musculoskeletal:     Right lower leg: No edema.     Left lower leg: No edema.  Skin:    General: Skin is warm and dry.  Neurological:     General: No focal deficit present.     Mental Status: She is alert and oriented to person, place, and time.  Psychiatric:        Mood and Affect: Mood normal.        Behavior: Behavior normal.           Assessment & Plan:  1. Cough:She has had worsening of the symptoms over the  last few weeks.  Continues to have issues.  She has not been imaged since 2011 we will proceed with CT scan of the chest noncontrasted as she is a high risk patient.  2. Asthma/COPD overlap syndrome: Moderate in degree.This was confirmed by PFTs performed on 21 February.    She is doing well with Trelegy Ellipta currently.  3. Perennial nonallergic rhinitis:IgE and RAST were negative. Suspect it is perennial nonallergic rhinitis due to unknown triggers.   Continue ENT follow-up and nasal hygiene.    She may need more formal allergy testing.  4. Laryngopharyngeal reflux/GERD:Continue omeprazole.  Query whether she has hiatal hernia.  Her reflux symptoms are worsening.  5. Morbid  obesity (BMI 41.6:Weight loss is advised. This issue adds complexity to her management and may be aggravating her issues above.  6.  Lung nodules: These have been noted in the past.  Has not had imaging since 2011.  Repeating imaging as above.   This chart was dictated using voice recognition software/Dragon.  Despite best efforts to proofread, errors can occur which can change the meaning.  Any change was purely unintentional.

## 2019-07-08 ENCOUNTER — Encounter: Payer: Self-pay | Admitting: Pulmonary Disease

## 2019-07-14 ENCOUNTER — Ambulatory Visit
Admission: RE | Admit: 2019-07-14 | Discharge: 2019-07-14 | Disposition: A | Discharge status: Home / Self Care | Payer: Medicare Other | Source: Ambulatory Visit | Attending: Pulmonary Disease | Admitting: Pulmonary Disease

## 2019-07-14 ENCOUNTER — Other Ambulatory Visit: Payer: Self-pay

## 2019-07-14 DIAGNOSIS — R911 Solitary pulmonary nodule: Secondary | ICD-10-CM | POA: Insufficient documentation

## 2019-07-14 DIAGNOSIS — R05 Cough: Secondary | ICD-10-CM | POA: Insufficient documentation

## 2019-07-14 DIAGNOSIS — R059 Cough, unspecified: Secondary | ICD-10-CM

## 2019-07-16 ENCOUNTER — Telehealth: Payer: Self-pay | Admitting: Pulmonary Disease

## 2019-07-16 NOTE — Telephone Encounter (Signed)
Spoke with the pt  She is requesting her ct results from ct chest performed 07/14/19  Please advise, thanks!

## 2019-07-19 NOTE — Telephone Encounter (Signed)
Lm for pt to relay results. 

## 2019-07-19 NOTE — Telephone Encounter (Signed)
Chest CT shows emphysema. She has lung nodules as previous, no change. Small hiatal hernia. Nothing major. Her cough is likely related to post nasal drip/reflux.

## 2019-07-19 NOTE — Telephone Encounter (Signed)
Pt is aware of results and voiced her understanding. Nothing further is needed.  

## 2019-08-25 ENCOUNTER — Telehealth: Payer: Self-pay | Admitting: Pulmonary Disease

## 2019-08-25 MED ORDER — TRELEGY ELLIPTA 100-62.5-25 MCG/INH IN AEPB: 1.0000 | INHALATION_SPRAY | Freq: Every day | RESPIRATORY_TRACT | 0 refills | Status: AC

## 2019-08-25 NOTE — Telephone Encounter (Signed)
Called and spoke to pt, who is requesting a sample Trelegy. Pt is currently in the donut hole. One sample of Trelegy has been placed up front for pickup. Nothing further is needed.

## 2020-01-01 ENCOUNTER — Other Ambulatory Visit: Payer: Self-pay | Admitting: Pulmonary Disease

## 2020-01-03 ENCOUNTER — Telehealth: Payer: Self-pay | Admitting: Pulmonary Disease

## 2020-01-03 MED ORDER — BUDESONIDE-FORMOTEROL FUMARATE 160-4.5 MCG/ACT IN AERO: 2.0000 | INHALATION_SPRAY | Freq: Two times a day (BID) | RESPIRATORY_TRACT | 6 refills | Status: DC

## 2020-01-03 NOTE — Telephone Encounter (Signed)
Called and spoke to pt, who is requesting to switch back to Symbicort from Trelegy, as insurance is no longer covering Trelegy.   Dr. Jayme Cloud, please advise. thanks

## 2020-01-03 NOTE — Telephone Encounter (Signed)
Pt is aware of below message and voiced her understanding.  Rx for Symbicort 160 has been sent to preferred pharmacy.  Pt has been scheduled for ROV on 01/20/2020 with Dr. Jayme Cloud. Nothing further is needed.

## 2020-01-03 NOTE — Telephone Encounter (Signed)
Resume Symbicort 160/4.5, 2 inhalations twice a day.  If she notes that this is not controlling her we can reevaluate her and see if she needs Spiriva added to it.

## 2020-01-19 ENCOUNTER — Telehealth: Payer: Self-pay | Admitting: Pulmonary Disease

## 2020-01-19 NOTE — Telephone Encounter (Signed)
Called pt to see what her symptoms were. She stated this started a couple weeks ago, she just finished her round of prednisone on Sunday. No fever or chills. She is SOB but no more than normal due to COPD. With the sinus infection she is having drainage and cough.. She mentioned that if you wanted her to do a phone visit or reschedule, she was fine with that. Please advise.

## 2020-01-19 NOTE — Telephone Encounter (Signed)
Can do phone visit if she is more comfortable with that.  Otherwise she should be okay to come in.

## 2020-01-19 NOTE — Telephone Encounter (Signed)
Patient stated that she will do her visit via the phone. Nothing further needed.

## 2020-01-20 ENCOUNTER — Ambulatory Visit (INDEPENDENT_AMBULATORY_CARE_PROVIDER_SITE_OTHER): Payer: Medicare Other | Admitting: Pulmonary Disease

## 2020-01-20 ENCOUNTER — Encounter: Payer: Self-pay | Admitting: Pulmonary Disease

## 2020-01-20 DIAGNOSIS — J449 Chronic obstructive pulmonary disease, unspecified: Secondary | ICD-10-CM

## 2020-01-20 DIAGNOSIS — J309 Allergic rhinitis, unspecified: Secondary | ICD-10-CM | POA: Diagnosis not present

## 2020-01-20 DIAGNOSIS — R059 Cough, unspecified: Secondary | ICD-10-CM

## 2020-01-20 DIAGNOSIS — J0191 Acute recurrent sinusitis, unspecified: Secondary | ICD-10-CM

## 2020-01-20 DIAGNOSIS — R05 Cough: Secondary | ICD-10-CM | POA: Diagnosis not present

## 2020-01-20 DIAGNOSIS — R918 Other nonspecific abnormal finding of lung field: Secondary | ICD-10-CM

## 2020-01-20 MED ORDER — CLARITHROMYCIN 500 MG PO TABS: 500.0000 mg | ORAL_TABLET | Freq: Two times a day (BID) | ORAL | 0 refills | Status: AC

## 2020-01-20 NOTE — Progress Notes (Signed)
   Subjective:    Patient ID: Rhonda Oconnell, female    DOB: 1951/04/05, 69 y.o.   MRN: [id number] Virtual Visit Via Video or Telephone Note:   This visit type was conducted due to national recommendations for restrictions regarding the COVID-19 pandemic .  This format is felt to be most appropriate for this patient at this time.  All issues noted in this document were discussed and addressed.  No physical exam was performed (except for noted visual exam findings with Video Visits).    I connected with Rhonda Oconnell by telephone at 10:10 AM and verified that I was speaking with the correct person using two identifiers. Location patient: home Location provider: [facility] Persons participating in the virtual visit: patient, physician   I discussed the limitations, risks, security and privacy concerns of performing an evaluation and management service by telephone and the availability of in person appointments. The patient expressed understanding and agreed to proceed. HPI 69 year old remote former smoker with chronic cough.  She has asthma/COPD overlap syndrome.  Cough is mediated by issues with chronic sinusitis and the pharyngeal reflux.  She is followed by [facility] for this issue.  Previously she had been on Trelegy Ellipta which helped her dyspnea but not her cough.  This however had to be switched back to Symbicort due to noncoverage by her insurance.  Recently she just finished a prednisone taper due to sinus issues flare.  She did not get an antibiotic.  Has purulent nasal discharge that has recurred.  She has not follow-up up with [facility].  She has had malaise but no fevers or chills.  No hemoptysis.  Cough for the most part is nonproductive.    Review of Systems A 10 point review of systems was performed and it is as noted above otherwise negative.    Objective:   Physical Exam  No physical exam performed as this was via telephone visit.  The patient was  speaking full complete sentences without conversational dyspnea.    Assessment & Plan:  Acute recurrent sinusitis Perennial allergic rhinitis Continue nasal hygiene She just completed prednisone taper with improvement on rhinitis symptoms Sinusitis issues are resolved Biaxin 500 mg twice a day x7 days Hold use of Lipitor while on Biaxin  Asthma/COPD overlap syndrome Chronic cough Cough is mostly related to upper airway cough syndrome and LPRD Continue Symbicort 160/4.5, 2 inhalations twice a day  Multiple lung nodules Will need repeat CT within the next few months   Follow-up in 4 to 6 weeks time call sooner should any new difficulties arise  Total visit time: 12 minutes  C. Danice Goltz, MD [facility]   *This note was dictated using voice recognition software/Dragon.  Despite best efforts to proofread, errors can occur which can change the meaning.  Any change was purely unintentional.

## 2020-01-20 NOTE — Patient Instructions (Signed)
We will see him in follow-up in 4 to 6 weeks time  We called in Biaxin 500 mg twice a day for 7 days  As we discussed, do not take Lipitor while taking the Biaxin

## 2020-02-25 ENCOUNTER — Other Ambulatory Visit: Payer: Self-pay

## 2020-02-25 ENCOUNTER — Encounter: Payer: Self-pay | Admitting: Pulmonary Disease

## 2020-02-25 ENCOUNTER — Ambulatory Visit (INDEPENDENT_AMBULATORY_CARE_PROVIDER_SITE_OTHER): Payer: Medicare Other | Admitting: Pulmonary Disease

## 2020-02-25 VITALS — BP 170/86 | HR 96 | Temp 98.4°F | Ht 62.0 in | Wt 228.0 lb

## 2020-02-25 DIAGNOSIS — R011 Cardiac murmur, unspecified: Secondary | ICD-10-CM

## 2020-02-25 DIAGNOSIS — R05 Cough: Secondary | ICD-10-CM

## 2020-02-25 DIAGNOSIS — I491 Atrial premature depolarization: Secondary | ICD-10-CM

## 2020-02-25 DIAGNOSIS — J302 Other seasonal allergic rhinitis: Secondary | ICD-10-CM

## 2020-02-25 DIAGNOSIS — J449 Chronic obstructive pulmonary disease, unspecified: Secondary | ICD-10-CM | POA: Diagnosis not present

## 2020-02-25 DIAGNOSIS — J3089 Other allergic rhinitis: Secondary | ICD-10-CM

## 2020-02-25 DIAGNOSIS — R059 Cough, unspecified: Secondary | ICD-10-CM

## 2020-02-25 MED ORDER — PREDNISONE 10 MG (21) PO TBPK: ORAL_TABLET | ORAL | 0 refills | Status: DC

## 2020-02-25 MED ORDER — TRELEGY ELLIPTA 100-62.5-25 MCG/INH IN AEPB: 1.0000 | INHALATION_SPRAY | Freq: Every day | RESPIRATORY_TRACT | 0 refills | Status: DC

## 2020-02-25 MED ORDER — ALBUTEROL SULFATE HFA 108 (90 BASE) MCG/ACT IN AERS: 2.0000 | INHALATION_SPRAY | Freq: Four times a day (QID) | RESPIRATORY_TRACT | 3 refills | Status: DC | PRN

## 2020-02-25 NOTE — Patient Instructions (Signed)
Regarding schedule you for an echocardiogram to check on your murmur  Your EKG today showed only just some extra beats  We have sent a prednisone package to your drugstore  You received your albuterol inhaler prescription  We will follow up in 2 months time call sooner should any new difficulties arise

## 2020-02-25 NOTE — Progress Notes (Signed)
Subjective:    Patient ID: Rhonda Oconnell, female    DOB: 09-02-1951, 69 y.o.   MRN: [id number]  HPI This is a 69 year old remote former smoker who presents for follow-up on cough and asthma/COPD overlap syndrome.  She has issues with chronic recurrent sinusitis and is followed by [facility] as well.  During her last visit we had to switch her off of Trelegy due to insurance issues and place her back on Symbicort.  She notes that she does markedly better on Trelegy.  Today she notes that she has been having more shortness of breath since her inhaler was switched to Symbicort.  She also notes increasing nasal congestion today and some increase in postnasal drip.  This has worsened her cough.  She has not had any chest pain, no orthopnea, paroxysmal nocturnal dyspnea nor lower extremity edema or calf tenderness.  Cough has been for the most part nonproductive.  When productive only scant clear to yellow sputum.  No fevers, chills or sweats.  No other complaints voiced.   Review of Systems A 10 point review of systems was performed and it is as noted above otherwise negative.  Immunization History  Administered Date(s) Administered  . Fluad Quad(high Dose 65+) 09/15/2019  . Influenza Inj Mdck Quad Pf 10/01/2018  . Influenza Split 10/31/2015  . Influenza-Unspecified 10/20/2017  . Pneumococcal Conjugate-13 08/29/2016  . Pneumococcal Polysaccharide-23 03/22/2011, 10/20/2017  . Zoster Recombinat (Shingrix) 01/08/2019, 05/05/2019   Allergies  Allergen Reactions  . Iodinated Diagnostic Agents Hives  . Iodine Hives  . Sulfa Antibiotics Hives   Current Meds  Medication Sig  . acetaminophen (TYLENOL) 500 MG tablet Take 500 mg by mouth every 6 (six) hours as needed.  Marland Kitchen albuterol (VENTOLIN HFA) 108 (90 Base) MCG/ACT inhaler Inhale 2 puffs into the lungs every 6 (six) hours as needed for wheezing or shortness of breath.  Marland Kitchen aspirin EC 81 MG tablet Take 81 mg by mouth daily.  Marland Kitchen atorvastatin  (LIPITOR) 20 MG tablet Take 20 mg by mouth daily.  . Calcium Carbonate-Vitamin D (OSCAL 500/200 D-3 PO) Take 1 tablet by mouth 2 (two) times daily.  . cetirizine (ZYRTEC) 10 MG tablet Take by mouth.  . chlorpheniramine (CHLOR-TRIMETON) 4 MG tablet Take 4 mg by mouth 2 (two) times daily as needed for allergies.  . diphenhydramine-acetaminophen (TYLENOL PM) 25-500 MG TABS tablet Take 1 tablet by mouth at bedtime as needed.  . fluticasone (FLONASE) 50 MCG/ACT nasal spray Place into the nose.  . hydrochlorothiazide (HYDRODIURIL) 12.5 MG tablet Take 12.5 mg by mouth daily.  Marland Kitchen losartan (COZAAR) 100 MG tablet Take 100 mg by mouth daily.  . montelukast (SINGULAIR) 10 MG tablet Take 10 mg by mouth at bedtime.  . Multiple Vitamin (MULTIVITAMIN) tablet Take 1 tablet by mouth daily.  Marland Kitchen omeprazole (PRILOSEC) 20 MG capsule Take 20 mg by mouth 2 (two) times daily before a meal.  . Spacer/Aero-Holding Chambers DEVI 1 each by Does not apply route daily.  . [DISCONTINUED] albuterol (PROVENTIL HFA;VENTOLIN HFA) 108 (90 Base) MCG/ACT inhaler Inhale 2 puffs into the lungs every 6 (six) hours as needed for wheezing or shortness of breath.  . [DISCONTINUED] budesonide-formoterol (SYMBICORT) 160-4.5 MCG/ACT inhaler Inhale 2 puffs into the lungs 2 (two) times daily.        Objective:   Physical Exam BP (!) 170/86 (BP Location: Left Arm, Patient Position: Sitting, Cuff Size: Large)   Pulse 96   Temp 98.4 F (36.9 C) (Oral)   Ht  5\' 2"  (1.575 m)   Wt 228 lb (103.4 kg)   SpO2 99%   BMI 41.70 kg/m   GENERAL: Obese woman, no acute distress, looks uncomfortable due to nasal congestion, awake, alert.  Fully ambulatory. HEAD: Normocephalic, atraumatic.  EYES: Pupils equal, round, reactive to light.  No scleral icterus.  MOUTH: Nose/mouth/throat not examined due to masking requirements for COVID 19. NECK: Supple. No thyromegaly. Trachea midline. No JVD.  No adenopathy. PULMONARY: Coarse, few end expiratory wheezes  noted.  No rhonchi. CARDIOVASCULAR: S1 and S2. Regular rate and rhythm with frequent ectopics.  Grade 2/6 systolic ejection murmur left sternal border, harsh. GASTROINTESTINAL: Obese, otherwise benign. MUSCULOSKELETAL: No joint deformity, no clubbing, no edema.  NEUROLOGIC: No focal deficits noted.  Awake, alert, fully oriented.  Speech is fluent. SKIN: Intact,warm,dry.  At that exam no rashes. PSYCH: Mood and behavior normal.  EKG performed today: Sinus rhythm with premature atrial complexes noted otherwise normal.  QT interval 372 ms    Assessment & Plan:     ICD-10-CM   1. Asthma-COPD overlap syndrome (HCC)  J44.9    Not doing as well on Symbicort, switch back to Trelegy Ellipta 100/6.25/25 1 inhalation daily Renewed albuterol rescue inhaler Prednisone taper  2. Cough  R05    This worsens when her asthma/COPD overlap syndrome not well controlled Medication change as above  3. Cardiac murmur  R01.1 ECHOCARDIOGRAM COMPLETE   Will obtain echocardiogram, concern for AS  4. Premature atrial complexes  I49.1    Arrhythmia noted during examination, PACs by EKG  5. Perennial allergic rhinitis with seasonal variation  J30.89    J30.2    Continue follow-up with [facility] Continue Singulair, loratadine and Flonase    Meds ordered this encounter  Medications  . DISCONTD: predniSONE (STERAPRED UNI-PAK 21 TAB) 10 MG (21) TBPK tablet    Sig: Take as directed in package    Dispense:  1 each    Refill:  0  . albuterol (VENTOLIN HFA) 108 (90 Base) MCG/ACT inhaler    Sig: Inhale 2 puffs into the lungs every 6 (six) hours as needed for wheezing or shortness of breath.    Dispense:  18 g    Refill:  3  . Fluticasone-Umeclidin-Vilant (TRELEGY ELLIPTA) 100-62.5-25 MCG/INH AEPB    Sig: Inhale 1 puff into the lungs daily.    Dispense:  28 each    Refill:  0    Order Specific Question:   Lot Number?    Answer:   [id number]    Order Specific Question:   Expiration Date?    Answer:   07/07/2021     Order Specific Question:   Manufacturer?    Answer:   Gl[employer]SmithKline [[id number]]    Order Specific Question:   Quantity    Answer:   1   Orders Placed This Encounter  Procedures  . ECHOCARDIOGRAM COMPLETE    Standing Status:   Future    Number of Occurrences:   1    Standing Expiration Date:   05/27/2021    Order Specific Question:   Where should this test be performed    Answer:    [facility]    Order Specific Question:   Perflutren DEFINITY (image enhancing agent) should be administered unless hypersensitivity or allergy exist    Answer:   Administer Perflutren    Order Specific Question:   Reason for exam-Echo    Answer:   Murmur  785.2 / R01.1    Order Specific Question:  Release to patient    Answer:   Immediate    Discussion:  Patient presented today not doing as well as previous mainly due to worsening cough and dyspnea likely related to change in inhaler due to lack of coverage from her insurance.  Was noted to have frequent ectopy during physical exam as well as a cardiac murmur.  EKG showed that ectopics were mostly PACs.  Relatively benign EKG.  Will evaluate murmur with 2D echo.  Want to exclude other causes that may aggravate her dyspnea.  In the meantime we will switch her back to Trelegy Ellipta 100/62.5/25, 1 inhalation daily.  She has a mild flare and will give her prednisone taper.  No need for antibiotics at present.  She is to continue follow-up with [facility] for her chronic sinusitis and seasonal allergic rhinitis.  Follow-up will be in 2 months time she is to contact us prior to that time should any new difficulties arise.   Gailen Shelter, MD  [facility]   *This note was dictated using voice recognition software/Dragon.  Despite best efforts to proofread, errors can occur which can change the meaning.  Any change was purely unintentional.

## 2020-03-08 NOTE — Telephone Encounter (Signed)
Left pt message as to why Symbicort is being requested. Per March ov with Dr.G patient was started on Trelegy.

## 2020-03-13 ENCOUNTER — Telehealth: Payer: Self-pay | Admitting: Pulmonary Disease

## 2020-03-13 NOTE — Telephone Encounter (Signed)
Spoke with the pt  She states trelegy given at last visit and wants to remain on that  She wants to let us know to not refill the symbicort if we get a request for this  I have taken symbicort off of the [facility]  Nothing further needed per pt

## 2020-03-28 ENCOUNTER — Encounter (INDEPENDENT_AMBULATORY_CARE_PROVIDER_SITE_OTHER): Payer: Self-pay

## 2020-03-29 ENCOUNTER — Other Ambulatory Visit: Payer: Self-pay

## 2020-03-29 ENCOUNTER — Ambulatory Visit
Admission: RE | Admit: 2020-03-29 | Discharge: 2020-03-29 | Disposition: A | Discharge status: Home / Self Care | Payer: Medicare Other | Source: Ambulatory Visit | Attending: Pulmonary Disease | Admitting: Pulmonary Disease

## 2020-03-29 DIAGNOSIS — R011 Cardiac murmur, unspecified: Secondary | ICD-10-CM | POA: Diagnosis not present

## 2020-03-29 DIAGNOSIS — J449 Chronic obstructive pulmonary disease, unspecified: Secondary | ICD-10-CM | POA: Diagnosis not present

## 2020-03-29 DIAGNOSIS — I358 Other nonrheumatic aortic valve disorders: Secondary | ICD-10-CM | POA: Diagnosis not present

## 2020-03-29 DIAGNOSIS — I10 Essential (primary) hypertension: Secondary | ICD-10-CM | POA: Diagnosis not present

## 2020-03-29 NOTE — Progress Notes (Signed)
*  PRELIMINARY RESULTS* Echocardiogram 2D Echocardiogram has been performed.  Cristela Blue 03/29/2020, 11:18 AM

## 2020-04-22 ENCOUNTER — Other Ambulatory Visit: Payer: Self-pay | Admitting: Pulmonary Disease

## 2020-04-25 ENCOUNTER — Other Ambulatory Visit: Payer: Self-pay

## 2020-04-25 ENCOUNTER — Encounter: Payer: Self-pay | Admitting: Pulmonary Disease

## 2020-04-25 ENCOUNTER — Ambulatory Visit (INDEPENDENT_AMBULATORY_CARE_PROVIDER_SITE_OTHER): Payer: Medicare Other | Admitting: Pulmonary Disease

## 2020-04-25 VITALS — BP 122/80 | HR 82 | Temp 97.5°F | Ht 62.0 in | Wt 230.4 lb

## 2020-04-25 DIAGNOSIS — R0602 Shortness of breath: Secondary | ICD-10-CM

## 2020-04-25 DIAGNOSIS — I519 Heart disease, unspecified: Secondary | ICD-10-CM

## 2020-04-25 DIAGNOSIS — J449 Chronic obstructive pulmonary disease, unspecified: Secondary | ICD-10-CM

## 2020-04-25 DIAGNOSIS — I5189 Other ill-defined heart diseases: Secondary | ICD-10-CM

## 2020-04-25 MED ORDER — TRELEGY ELLIPTA 200-62.5-25 MCG/INH IN AEPB: 1.0000 | INHALATION_SPRAY | Freq: Every day | RESPIRATORY_TRACT | 0 refills | Status: DC

## 2020-04-25 NOTE — Progress Notes (Signed)
Subjective:    Patient ID: Rhonda Oconnell, female    DOB: 05/26/1951, 69 y.o.   MRN: [id number]  HPI 69 year old former smoker with COPD/asthma overlap syndrome and chronic recurrent sinusitis, she follows with [facility] for this issue.  Today she presents for the issue of COPD asthma overlap follow-up this is a scheduled visit.  She also has issues with cough.  During her last visit a cardiac murmur was noted and she underwent 2D echo, this was performed on 21 April.  She has a left ventricular ejection fraction 55 to 60% no wall motion abnormalities noted however she does have grade 2 diastolic dysfunction aortic sclerosis that likely is the etiology of her murmur.  There were no other gross abnormalities.  She has been having nocturnal awakenings and nocturia sometimes x2.  No PND per se.  No orthopnea.  She does not describe any lower extremity edema.  Since resuming Trelegy she has noted that her use of albuterol inhaler has decreased but she continues to have to rely on this.  Her cough is also better however still present.  She does have a significant reversible airways component and steroid sponsored to her symptoms.  She has not had any fevers, chills or sweats.  No hemoptysis.  No calf tenderness.   Review of Systems A 10 point review of systems was performed and it is as noted above otherwise negative.   Immunization History  Administered Date(s) Administered  . Fluad Quad(high Dose 65+) 09/15/2019  . Influenza Inj Mdck Quad Pf 10/01/2018  . Influenza Split 10/31/2015  . Influenza-Unspecified 10/20/2017  . Pneumococcal Conjugate-13 08/29/2016  . Pneumococcal Polysaccharide-23 03/22/2011, 10/20/2017  . Zoster Recombinat (Shingrix) 01/08/2019, 05/05/2019     Allergies  Allergen Reactions  . Iodinated Diagnostic Agents Hives  . Iodine Hives  . Sulfa Antibiotics Hives   Current Meds  Medication Sig  . acetaminophen (TYLENOL) 500 MG tablet Take 500 mg by mouth every 6 (six)  hours as needed.  Marland Kitchen albuterol (VENTOLIN HFA) 108 (90 Base) MCG/ACT inhaler Inhale 2 puffs into the lungs every 6 (six) hours as needed for wheezing or shortness of breath.  Marland Kitchen aspirin EC 81 MG tablet Take 81 mg by mouth daily.  Marland Kitchen atorvastatin (LIPITOR) 20 MG tablet Take 20 mg by mouth daily.  . Calcium Carbonate-Vitamin D (OSCAL 500/200 D-3 PO) Take 1 tablet by mouth 2 (two) times daily.  . cetirizine (ZYRTEC) 10 MG tablet Take by mouth.  . chlorpheniramine (CHLOR-TRIMETON) 4 MG tablet Take 4 mg by mouth 2 (two) times daily as needed for allergies.  . diphenhydramine-acetaminophen (TYLENOL PM) 25-500 MG TABS tablet Take 1 tablet by mouth at bedtime as needed.  . fluticasone (FLONASE) 50 MCG/ACT nasal spray Place into the nose.  Marland Kitchen Fluticasone-Umeclidin-Vilant (TRELEGY ELLIPTA) 100-62.5-25 MCG/INH AEPB Inhale 1 puff into the lungs daily.  . hydrochlorothiazide (HYDRODIURIL) 12.5 MG tablet Take 12.5 mg by mouth daily.  Marland Kitchen losartan (COZAAR) 100 MG tablet Take 100 mg by mouth daily.  . montelukast (SINGULAIR) 10 MG tablet Take 10 mg by mouth at bedtime.  . Multiple Vitamin (MULTIVITAMIN) tablet Take 1 tablet by mouth daily.  Marland Kitchen omeprazole (PRILOSEC) 20 MG capsule Take 20 mg by mouth 2 (two) times daily before a meal.  . Spacer/Aero-Holding Chambers DEVI 1 each by Does not apply route daily.     Objective:   Physical Exam BP 122/80 (BP Location: Left Arm, Cuff Size: Normal)   Pulse 82   Temp (!) 97.5  F (36.4 C) (Temporal)   Ht 5\' 2"  (1.575 m)   Wt 230 lb 6.4 oz (104.5 kg)   SpO2 95%   BMI 42.14 kg/m   GENERAL: Obese woman, no acute respiratory distress, fully ambulatory HEAD: Normocephalic, atraumatic.  EYES: Pupils equal, round, reactive to light.  No scleral icterus.  Mild periorbital puffiness. MOUTH: Nose/mouth/throat not examined due to masking requirements for COVID 19. NECK: Supple. No thyromegaly. Trachea midline. No JVD.  No adenopathy. PULMONARY: Good air entry bilaterally,  coarse, no other adventitious sounds. CARDIOVASCULAR: S1 and S2. Regular rate and rhythm.  Grade 1/6 to 2/6 systolic ejection murmur left sternal border.  To be noted today. GASTROINTESTINAL: Obese, benign. MUSCULOSKELETAL: No joint deformity, no clubbing, no edema.  NEUROLOGIC: Fully ambulatory with no gait disturbance noted.  Awake, alert, speech fluent, no overt focal deficits SKIN: Intact,warm,dry.  Mild plethora about the face. PSYCH: Normal mood and behavior.      Assessment & Plan:     ICD-10-CM   1. SOB (shortness of breath)  R06.02 Ambulatory referral to Cardiology    Pulse oximetry, overnight   Check overnight oximetry Diastolic dysfunction could be playing a role, referral to cardiology  2. Asthma-COPD overlap syndrome (HCC)  J44.9 Pulse oximetry, overnight   Doing relatively well with Trelegy but still using albuterol daily Trial of Trelegy 200 to see how she does  3. Grade II diastolic dysfunction  M57.8    Referral to cardiology as above   Meds ordered this encounter  Medications  . Fluticasone-Umeclidin-Vilant (TRELEGY ELLIPTA) 200-62.5-25 MCG/INH AEPB    Sig: Inhale 1 puff into the lungs daily.    Dispense:  28 each    Refill:  0    Order Specific Question:   Lot Number?    Answer:   [id number]    Order Specific Question:   Expiration Date?    Answer:   08/07/2021    Order Specific Question:   Manufacturer?    Answer:   GlaxoSmithKline [[id number]]    Order Specific Question:   Quantity    Answer:   1   Discussion:  She is doing much better back on Trelegy.  However she is still using albuterol almost daily.  We discussed increasing the level of steroid in the Trelegy a little bit so we will give her a trial of Trelegy 200/62.5/25.  Still 1 puff daily.  She is to let us know if this works better for her.  She does have significant diastolic dysfunction grade 2, some of her dyspnea may be related to this.  I recommend evaluation by cardiology for recommendations in this  regard.  She agrees with this.  Previously noted murmur may be related to aortic valve sclerosis.  Ventricular ejection fraction was normal at 55 to 60%. Given also her diastolic dysfunction we will also proceed with obtaining an overnight oximetry to ensure that she is not having any nocturnal desaturations during sleep.  We will see her in follow-up in 3 months time she is to contact us prior to that time should any new difficulties arise.   Renold Don, MD [facility]   *This note was dictated using voice recognition software/Dragon.  Despite best efforts to proofread, errors can occur which can change the meaning.  Any change was purely unintentional.

## 2020-04-25 NOTE — Patient Instructions (Signed)
We are going to check overnight oximetry  We increase the level of anti-inflammatory of the Trelegy a little bit to see if this helps with reducing use of albuterol, call us if this works for you and we will call the prescription to your pharmacy  Referring you to cardiology for your diastolic dysfunction (stiff heart)  Up in 3 months time call sooner should any new difficulties arise

## 2020-04-26 ENCOUNTER — Encounter: Payer: Self-pay | Admitting: Pulmonary Disease

## 2020-04-28 ENCOUNTER — Encounter: Payer: Self-pay | Admitting: Internal Medicine

## 2020-04-28 ENCOUNTER — Ambulatory Visit (INDEPENDENT_AMBULATORY_CARE_PROVIDER_SITE_OTHER): Payer: Medicare Other | Admitting: Internal Medicine

## 2020-04-28 ENCOUNTER — Other Ambulatory Visit: Payer: Self-pay

## 2020-04-28 VITALS — BP 130/84 | HR 89 | Ht 62.0 in | Wt 232.1 lb

## 2020-04-28 DIAGNOSIS — R0609 Other forms of dyspnea: Secondary | ICD-10-CM

## 2020-04-28 DIAGNOSIS — R0600 Dyspnea, unspecified: Secondary | ICD-10-CM | POA: Diagnosis not present

## 2020-04-28 DIAGNOSIS — I5032 Chronic diastolic (congestive) heart failure: Secondary | ICD-10-CM | POA: Diagnosis not present

## 2020-04-28 DIAGNOSIS — I10 Essential (primary) hypertension: Secondary | ICD-10-CM | POA: Diagnosis not present

## 2020-04-28 MED ORDER — HYDROCHLOROTHIAZIDE 25 MG PO TABS: 25.0000 mg | ORAL_TABLET | Freq: Every day | ORAL | 3 refills | Status: DC

## 2020-04-28 NOTE — Progress Notes (Signed)
New Outpatient Visit Date: 04/28/2020  Referring Provider: Tyler Pita, MD [address] [place],  [place] [id number]  Chief Complaint: Shortness of breath  HPI:  Rhonda Oconnell is a 69 y.o. female who is being seen today for the evaluation of shortness of breath, heart murmur, and diastolic dysfunction by echocardiogram at the request of Dr. Patsey Berthold. She has a history of obstructive lung disease (COPD/asthma overlap syndrome complicated by recurrent sinusitis), hypertension, hyperlipidemia, GERD, anemia, and morbid obesity. She reports a long history of exertional dyspnea, particularly when walking uphill, which has gradually progressed.  She denies having chest pain but often feels weak all over when doing modest activity.  Rhonda Oconnell notes that her legs swell when she wears flip-flops, though she otherwise denies significant edema.  She denies orthopnea nd PND.  Rhonda Oconnell occasionally wakes up with the sensation that her heart is pounding.  She feels startled when this happens but is otherwise asymptomatic.  She has not noticed palpitations during the day nor lightheadedness.  --------------------------------------------------------------------------------------------------  Cardiovascular History & Procedures: Cardiovascular Problems:  Diastolic dysfunction and shortness of breath  Risk Factors:  Hypertension, hyperlipidemia, morbid obesity, prior tobacco use, and age greater than 90  Cath/PCI:  None  CV Surgery:  None  EP Procedures and Devices:  None  Non-Invasive Evaluation(s):  TTE (03/29/2020): Technically difficult study.  LVEF 55-60% with grade 2 diastolic dysfunction.  Normal RV size and function.  Grossly normal mitral valve.  Aortic sclerosis.  Recent CV Pertinent Labs: No results found for: CHOL, HDL, LDLCALC, LDLDIRECT, TRIG, CHOLHDL, INR, BNP, K, MG, BUN,  CREATININE  --------------------------------------------------------------------------------------------------  Past Medical History:  Diagnosis Date   Abdominal hernia    Allergic state    Anemia    Arthritis    Asthma    Cataract cortical, senile    COPD (chronic obstructive pulmonary disease) (HCC)    Dysphagia    GERD (gastroesophageal reflux disease)    Hemorrhoids    Hyperlipidemia    Hypertension    Rectal tear    Rectocele     Past Surgical History:  Procedure Laterality Date   ABDOMINAL HYSTERECTOMY     ARTHOSCOPIC ROTAOR CUFF REPAIR     BLADDER REPAIR     CATARACT EXTRACTION     CHOLECYSTECTOMY     COLONOSCOPY WITH PROPOFOL N/A 07/22/2016   Procedure: COLONOSCOPY WITH PROPOFOL;  Surgeon: Manya Silvas, MD;  Location: [facility] ENDOSCOPY;  Service: Endoscopy;  Laterality: N/A;   ESOPHAGOGASTRODUODENOSCOPY (EGD) WITH PROPOFOL N/A 07/22/2016   Procedure: ESOPHAGOGASTRODUODENOSCOPY (EGD) WITH PROPOFOL;  Surgeon: Manya Silvas, MD;  Location: [facility] ENDOSCOPY;  Service: Endoscopy;  Laterality: N/A;   HEMORRHOID SURGERY     HERNIA REPAIR     LAPAROSCOPIC SPLENECTOMY     TUBAL LIGATION      Current Meds  Medication Sig   acetaminophen (TYLENOL) 500 MG tablet Take 500 mg by mouth every 6 (six) hours as needed.   albuterol (VENTOLIN HFA) 108 (90 Base) MCG/ACT inhaler Inhale 2 puffs into the lungs every 6 (six) hours as needed for wheezing or shortness of breath.   aspirin EC 81 MG tablet Take 81 mg by mouth daily.   atorvastatin (LIPITOR) 20 MG tablet Take 20 mg by mouth daily.   Calcium Carbonate-Vitamin D (OSCAL 500/200 D-3 PO) Take 1 tablet by mouth 2 (two) times daily.   cetirizine (ZYRTEC) 10 MG tablet Take by mouth.   chlorpheniramine (CHLOR-TRIMETON) 4 MG tablet Take 4 mg  by mouth 2 (two) times daily as needed for allergies.   diphenhydramine-acetaminophen (TYLENOL PM) 25-500 MG TABS tablet Take 1 tablet by mouth at bedtime as  needed.   fluticasone (FLONASE) 50 MCG/ACT nasal spray Place into the nose.   Fluticasone-Umeclidin-Vilant (TRELEGY ELLIPTA) 100-62.5-25 MCG/INH AEPB Inhale 1 puff into the lungs daily.   Fluticasone-Umeclidin-Vilant (TRELEGY ELLIPTA) 200-62.5-25 MCG/INH AEPB Inhale 1 puff into the lungs daily.   hydrochlorothiazide (HYDRODIURIL) 12.5 MG tablet Take 12.5 mg by mouth daily.   losartan (COZAAR) 100 MG tablet Take 100 mg by mouth daily.   montelukast (SINGULAIR) 10 MG tablet Take 10 mg by mouth at bedtime.   Multiple Vitamin (MULTIVITAMIN) tablet Take 1 tablet by mouth daily.   omeprazole (PRILOSEC) 20 MG capsule Take 20 mg by mouth 2 (two) times daily before a meal.   Spacer/Aero-Holding Chambers DEVI 1 each by Does not apply route daily.    Allergies: Iodinated diagnostic agents, Iodine, and Sulfa antibiotics  Social History   Tobacco Use   Smoking status: Former Smoker    Types: Cigarettes    Quit date: 08/15/1993    Years since quitting: 26.7   Smokeless tobacco: Never Used  Substance Use Topics   Alcohol use: No   Drug use: No    Family History  Problem Relation Age of Onset   Varicose Veins Mother    Atrial fibrillation Mother    Cancer Father    Varicose Veins Maternal Grandmother    Atrial fibrillation Brother     Review of Systems: A 12-system review of systems was performed and was negative except as noted in the HPI.  --------------------------------------------------------------------------------------------------  Physical Exam: BP 130/84 (BP Location: Right Arm, Patient Position: Sitting, Cuff Size: Normal)    Pulse 89    Ht 5\' 2"  (1.575 m)    Wt 232 lb 2 oz (105.3 kg)    SpO2 97%    BMI 42.46 kg/m   General:  NAD. HEENT: No conjunctival pallor or scleral icterus. Facemask in place. Neck: Supple without lymphadenopathy, thyromegaly, JVD, or HJR, though body habitus limits evaluation. No carotid bruit. Lungs: Normal work of breathing. Clear to  auscultation bilaterally without wheezes or crackles. Heart: Regular rate and rhythm without murmurs, rubs, or gallops. Non-displaced PMI. Abd: Bowel sounds present. Soft, NT/ND without hepatosplenomegaly Ext: Trace pretibial edema. Radial, PT, and DP pulses are 2+ bilaterally Skin: Warm and dry without rash. Neuro: CNIII-XII intact. Strength and fine-touch sensation intact in upper and lower extremities bilaterally. Psych: Normal mood and affect.  EKG:  NSR with PAC's.  Otherwise, no significant abnormality.  Outside labs (11/09/2019): CBC: WBC 11.0, HGB 13.6, HCT 41.7, PLT 378  CMP: Na 139, K 4.2, Cl 102, CO2 30, BUN 12, creatinine 0.7, glucose 86, Ca 10.1, AST 20, ALT 19, ALP 110, Tbili 0.4, Tprot 7.2, albumin 4.3  Lipid panel: TC 196, HDL 51, LDL 109, TG 180  --------------------------------------------------------------------------------------------------  ASSESSMENT AND PLAN: Dyspnea on exertion and chronic HFpEF: I suspect that longstanding DOE is multifactorial, including an element of diastolic dysfunction superimposed on obstructive lung disease and morbid obesity.  Rhonda Oconnell does not appear grossly volume overloaded.  I have recommended increasing HCTZ to 25 mg daily for gentle diuresis and improvement in blood pressure.  We may need to try a loop diuretic in the future if she does not experience any improvement.  We will also obtain a pharmacologic myocardial perfusion stress test to exclude ischemia.  If not previously done, evaluation for obstructive sleep  apnea should be considered.  Hypertension: Blood pressure borderline today.  As above, I have recommended escalation of HCTZ to 25 mg daily.  A BMP should be checked when Rhonda Oconnell follows up in ~1 month.  No other medication changes recommended today.  Morbid obesity: Weight loss encouraged through diet and exercise.  Follow-up: Return to clinic in 1 month.  Yvonne Kendall, MD 04/28/2020 3:28 PM

## 2020-04-28 NOTE — Patient Instructions (Addendum)
Medication Instructions:  Your physician has recommended you make the following change in your medication:  1- INCREASE Hydrochlorothiazide to 25 mg by mouth once a day.  *If you need a refill on your cardiac medications before your next appointment, please call your pharmacy*   Lab Work: none If you have labs (blood work) drawn today and your tests are completely normal, you will receive your results only by: Marland Kitchen MyChart Message (if you have MyChart) OR . A paper copy in the mail If you have any lab test that is abnormal or we need to change your treatment, we will call you to review the results.   Testing/Procedures: [facility] Lexiscan MYOVIEW  Your caregiver has ordered a Stress Test with nuclear imaging. The purpose of this test is to evaluate the blood supply to your heart muscle. This procedure is referred to as a "Non-Invasive Stress Test." This is because other than having an IV started in your vein, nothing is inserted or "invades" your body. Cardiac stress tests are done to find areas of poor blood flow to the heart by determining the extent of coronary artery disease (CAD). Some patients exercise on a treadmill, which naturally increases the blood flow to your heart, while others who are  unable to walk on a treadmill due to physical limitations have a pharmacologic/chemical stress agent called Lexiscan . This medicine will mimic walking on a treadmill by temporarily increasing your coronary blood flow.   Please note: these test may take anywhere between 2-4 hours to complete  PLEASE REPORT TO [facility] MEDICAL MALL ENTRANCE  THE VOLUNTEERS AT THE FIRST DESK WILL DIRECT YOU WHERE TO GO  Date of Procedure:_____________________________________  Arrival Time for Procedure:______________________________   PLEASE NOTIFY THE OFFICE AT LEAST 24 HOURS IN ADVANCE IF YOU ARE UNABLE TO KEEP YOUR APPOINTMENT.  [phone] AND  PLEASE NOTIFY NUCLEAR MEDICINE AT [facility] AT LEAST 24 HOURS IN ADVANCE IF  YOU ARE UNABLE TO KEEP YOUR APPOINTMENT. [phone]  How to prepare for your Myoview test:  1. Do not eat or drink after midnight 2. No caffeine for 24 hours prior to test 3. No smoking 24 hours prior to test. 4. Your medication may be taken with water.  If your doctor stopped a medication because of this test, do not take that medication. 5. Ladies, please do not wear dresses.  Skirts or pants are appropriate. Please wear a short sleeve shirt. 6. No perfume, cologne or lotion. 7. Wear comfortable walking shoes. No heels.   Follow-Up: At [facility], you and your health needs are our priority.  As part of our continuing mission to provide you with exceptional heart care, we have created designated Provider Care Teams.  These Care Teams include your primary Cardiologist (physician) and Advanced Practice Providers (APPs -  Physician Assistants and Nurse Practitioners) who all work together to provide you with the care you need, when you need it.  We recommend signing up for the patient portal called "MyChart".  Sign up information is provided on this After Visit Summary.  MyChart is used to connect with patients for Virtual Visits (Telemedicine).  Patients are able to view lab/test results, encounter notes, upcoming appointments, etc.  Non-urgent messages can be sent to your provider as well.   To learn more about what you can do with MyChart, go to [email or web address].    Your next appointment:   1 month(s)  The format for your next appointment:   In Person  Provider:    You  may see DR Cristal Deer END or one of the following Advanced Practice Providers on your designated Care Team:    Nicolasa Ducking, NP  Eula Listen, PA-C  Marisue Ivan, PA-C     Cardiac Nuclear Scan A cardiac nuclear scan is a test that measures blood flow to the heart when a person is resting and when he or she is exercising. The test looks for problems such as:  Not enough blood reaching a  portion of the heart.  The heart muscle not working normally. You may need this test if:  You have heart disease.  You have had abnormal lab results.  You have had heart surgery or a balloon procedure to open up blocked arteries (angioplasty).  You have chest pain.  You have shortness of breath. In this test, a radioactive dye (tracer) is injected into your bloodstream. After the tracer has traveled to your heart, an imaging device is used to measure how much of the tracer is absorbed by or distributed to various areas of your heart. This procedure is usually done at a hospital and takes 2-4 hours. Tell a health care provider about:  Any allergies you have.  All medicines you are taking, including vitamins, herbs, eye drops, creams, and over-the-counter medicines.  Any problems you or family members have had with anesthetic medicines.  Any blood disorders you have.  Any surgeries you have had.  Any medical conditions you have.  Whether you are pregnant or may be pregnant. What are the risks? Generally, this is a safe procedure. However, problems may occur, including:  Serious chest pain and heart attack. This is only a risk if the stress portion of the test is done.  Rapid heartbeat.  Sensation of warmth in your chest. This usually passes quickly.  Allergic reaction to the tracer. What happens before the procedure?  Ask your health care provider about changing or stopping your regular medicines. This is especially important if you are taking diabetes medicines or blood thinners.  Follow instructions from your health care provider about eating or drinking restrictions.  Remove your jewelry on the day of the procedure. What happens during the procedure?  An IV will be inserted into one of your veins.  Your health care provider will inject a small amount of radioactive tracer through the IV.  You will wait for 20-40 minutes while the tracer travels through your  bloodstream.  Your heart activity will be monitored with an electrocardiogram (ECG).  You will lie down on an exam table.  Images of your heart will be taken for about 15-20 minutes.  You may also have a stress test. For this test, one of the following may be done: ? You will exercise on a treadmill or stationary bike. While you exercise, your heart's activity will be monitored with an ECG, and your blood pressure will be checked. ? You will be given medicines that will increase blood flow to parts of your heart. This is done if you are unable to exercise.  When blood flow to your heart has peaked, a tracer will again be injected through the IV.  After 20-40 minutes, you will get back on the exam table and have more images taken of your heart.  Depending on the type of tracer used, scans may need to be repeated 3-4 hours later.  Your IV line will be removed when the procedure is over. The procedure may vary among health care providers and hospitals. What happens after the procedure?  Unless  your health care provider tells you otherwise, you may return to your normal schedule, including diet, activities, and medicines.  Unless your health care provider tells you otherwise, you may increase your fluid intake. This will help to flush the contrast dye from your body. Drink enough fluid to keep your urine pale yellow.  Ask your health care provider, or the department that is doing the test: ? When will my results be ready? ? How will I get my results? Summary  A cardiac nuclear scan measures the blood flow to the heart when a person is resting and when he or she is exercising.  Tell your health care provider if you are pregnant.  Before the procedure, ask your health care provider about changing or stopping your regular medicines. This is especially important if you are taking diabetes medicines or blood thinners.  After the procedure, unless your health care provider tells you  otherwise, increase your fluid intake. This will help flush the contrast dye from your body.  After the procedure, unless your health care provider tells you otherwise, you may return to your normal schedule, including diet, activities, and medicines. This information is not intended to replace advice given to you by your health care provider. Make sure you discuss any questions you have with your health care provider. Document Revised: 05/11/2018 Document Reviewed: 05/11/2018 Elsevier Patient Education  [employer].

## 2020-04-29 DIAGNOSIS — I5032 Chronic diastolic (congestive) heart failure: Secondary | ICD-10-CM | POA: Insufficient documentation

## 2020-04-29 DIAGNOSIS — R0609 Other forms of dyspnea: Secondary | ICD-10-CM | POA: Insufficient documentation

## 2020-05-09 ENCOUNTER — Telehealth: Payer: Self-pay | Admitting: Pulmonary Disease

## 2020-05-09 MED ORDER — TRELEGY ELLIPTA 200-62.5-25 MCG/INH IN AEPB: 1.0000 | INHALATION_SPRAY | Freq: Every day | RESPIRATORY_TRACT | 3 refills | Status: DC

## 2020-05-09 NOTE — Telephone Encounter (Signed)
Called and spoke with patient. Prescription is ready for patient to pick up she is aware. Nothing further needed at this time.

## 2020-05-09 NOTE — Telephone Encounter (Signed)
ATC patient about RX being sent in for Trelegy [facility]

## 2020-05-09 NOTE — Telephone Encounter (Signed)
Pt called back about this, please call back.  

## 2020-05-12 ENCOUNTER — Telehealth: Payer: Self-pay | Admitting: Internal Medicine

## 2020-05-12 NOTE — Telephone Encounter (Signed)
S/w patient. 04/28/20 Dr End increase HCTZ to 25 mg daily. One week later ~ 05/05/20 patient began having sensation that she could not empty her bladder, still felt fullness. Denies burning. Reported lower back pain as well.  Had routine labs earlier this week with PCP and U/A was performed (results in Care Everywhere). Showed Trace leukocytes. Patient was finally able to void well and felt empty this morning. But still feels a lot of pressure and back hurting.  Advised her to call her PCP to report these symptoms for further evaluation. Advised I will make Dr End aware for his recommendations re: HCTZ.

## 2020-05-12 NOTE — Telephone Encounter (Signed)
Pt c/o medication issue:  1. Name of Medication: hydrochlorothiazide  2. How are you currently taking this medication (dosage and times per day)? 25 mg (changed on 5/21) from 12.5 mg  3. Are you having a reaction (difficulty breathing--STAT)?   4. What is your medication issue? Lessened output, legs swelling, lower back pain  Patient took old amount this morning and stated things felt normal again

## 2020-05-12 NOTE — Telephone Encounter (Signed)
Patient verbalized understanding. She is seeing her PCP on Tuesday.

## 2020-05-12 NOTE — Telephone Encounter (Signed)
Ms. Vanstone can hold HCTZ for now but should see her PCP for further evaluation.  Yvonne Kendall, MD [facility]

## 2020-05-15 ENCOUNTER — Other Ambulatory Visit: Payer: Self-pay

## 2020-05-15 ENCOUNTER — Ambulatory Visit
Admission: RE | Admit: 2020-05-15 | Discharge: 2020-05-15 | Disposition: A | Discharge status: Home / Self Care | Payer: Medicare Other | Source: Ambulatory Visit | Attending: Internal Medicine | Admitting: Internal Medicine

## 2020-05-15 DIAGNOSIS — R0609 Other forms of dyspnea: Secondary | ICD-10-CM

## 2020-05-15 DIAGNOSIS — R0600 Dyspnea, unspecified: Secondary | ICD-10-CM | POA: Diagnosis present

## 2020-05-15 LAB — NM MYOCAR MULTI W/SPECT W/WALL MOTION / EF
Estimated workload: 1 METS
Exercise duration (min): 0 min
Exercise duration (sec): 0 s
LV dias vol: 76 mL (ref 46–106)
LV sys vol: 26 mL
MPHR: 153 {beats}/min
Peak HR: 110 {beats}/min
Percent HR: 71 %
Rest HR: 70 {beats}/min
SDS: 3
SRS: 4
SSS: 6
TID: 0.76

## 2020-05-15 MED ORDER — TECHNETIUM TC 99M TETROFOSMIN IV KIT
30.0000 | PACK | Freq: Once | INTRAVENOUS | Status: AC | PRN
  Administered 2020-05-15: 31.992 via INTRAVENOUS

## 2020-05-15 MED ORDER — REGADENOSON 0.4 MG/5ML IV SOLN
0.4000 mg | Freq: Once | INTRAVENOUS | Status: AC
  Administered 2020-05-15: 0.4 mg via INTRAVENOUS

## 2020-05-15 MED ORDER — TECHNETIUM TC 99M TETROFOSMIN IV KIT
10.8600 | PACK | Freq: Once | INTRAVENOUS | Status: AC | PRN
  Administered 2020-05-15: 10.86 via INTRAVENOUS

## 2020-05-16 ENCOUNTER — Telehealth: Payer: Self-pay | Admitting: Pulmonary Disease

## 2020-05-16 DIAGNOSIS — J449 Chronic obstructive pulmonary disease, unspecified: Secondary | ICD-10-CM

## 2020-05-16 DIAGNOSIS — Z9189 Other specified personal risk factors, not elsewhere classified: Secondary | ICD-10-CM

## 2020-05-16 NOTE — Telephone Encounter (Signed)
ONO results have not been received from [facility] as of yet. I have contacted [facility] and requested that results be faxed to our office.  Pt is aware of this information and voiced her understanding.

## 2020-05-16 NOTE — Telephone Encounter (Signed)
Results have been received and placed in Dr. Gonzalez's folder for review.  

## 2020-05-17 ENCOUNTER — Telehealth: Payer: Self-pay | Admitting: Pulmonary Disease

## 2020-05-17 NOTE — Telephone Encounter (Signed)
Rhonda Oconnell with [name] is aware that ONO has not been reviewed by Dr. Jayme Cloud. Rhonda Oconnell is aware that order will be placed once reviewed if pt wishes to proceed. Nothing further is needed at this time.

## 2020-05-22 NOTE — Telephone Encounter (Signed)
Pt is aware that ONO has not been reviewed as of yet.

## 2020-05-22 NOTE — Telephone Encounter (Signed)
She will need oxygen at 2 L/min.  I also recommend a home sleep study to further evaluate.  There were some apparent desaturations that may be better characterized by sleep study.

## 2020-05-22 NOTE — Telephone Encounter (Signed)
Per Dr. Jayme Cloud verbally- order split night vs HST.  Pt is aware of results and voiced her understanding.  Night time oxygen and sleep study has been ordered.  Nothing further is needed.

## 2020-05-22 NOTE — Telephone Encounter (Signed)
Pt called to get her ONO results.

## 2020-06-07 ENCOUNTER — Ambulatory Visit (INDEPENDENT_AMBULATORY_CARE_PROVIDER_SITE_OTHER): Payer: Medicare Other | Admitting: Family

## 2020-06-07 ENCOUNTER — Encounter: Payer: Self-pay | Admitting: Family

## 2020-06-07 ENCOUNTER — Other Ambulatory Visit: Payer: Self-pay

## 2020-06-07 VITALS — BP 130/90 | HR 100 | Ht 62.0 in | Wt 230.5 lb

## 2020-06-07 DIAGNOSIS — R0600 Dyspnea, unspecified: Secondary | ICD-10-CM | POA: Diagnosis not present

## 2020-06-07 DIAGNOSIS — Z79899 Other long term (current) drug therapy: Secondary | ICD-10-CM

## 2020-06-07 DIAGNOSIS — I5032 Chronic diastolic (congestive) heart failure: Secondary | ICD-10-CM | POA: Diagnosis not present

## 2020-06-07 DIAGNOSIS — R0609 Other forms of dyspnea: Secondary | ICD-10-CM

## 2020-06-07 DIAGNOSIS — I10 Essential (primary) hypertension: Secondary | ICD-10-CM

## 2020-06-07 MED ORDER — FUROSEMIDE 20 MG PO TABS
20.0000 mg | ORAL_TABLET | Freq: Every day | ORAL | 6 refills | Status: DC
Start: 2020-06-07 — End: 2020-07-12

## 2020-06-07 NOTE — Patient Instructions (Addendum)
Medication Instructions:  Your physician has recommended you make the following change in your medication:   1) Continue HCTZ 25 mg- 0.5 tablet (12.5 mg) by mouth once daily  2) START Lasix 59m- 1 tablet by mouth once daily  *If you need a refill on your cardiac medications before your next appointment, please call your pharmacy*   Lab Work: Your physician recommends that you return for lab work in: 2 weeks (around 06/21/20) for BMET - [facility] desk on the right past the screening table to check in (this will be the Registration desk) - You do not need to be fasting for these labs.   If you have labs (blood work) drawn today and your tests are completely normal, you will receive your results only by: .Marland KitchenMyChart Message (if you have MyChart) OR . A paper copy in the mail If you have any lab test that is abnormal or we need to change your treatment, we will call you to review the results.  Testing/Procedures: Your EKG today shows normal sinus rhythm.  Your stress test was a low risk study which was a great result.   Follow-Up: [facility] and your [facility] needs are our priority.  As part of our continuing mission to provide you with exceptional heart care, we have created designated Provider Care Teams.  These Care Teams include your primary Cardiologist (physician) and Advanced Practice Providers (APPs -  Physician Assistants and Nurse Practitioners) who all work together to provide you with the care you need, when you need it.  We recommend signing up for the patient portal called "MyChart".  Sign up information is provided on this After Visit Summary.  MyChart is used to connect with patients for Virtual Visits (Telemedicine).  Patients are able to view lab/test results, encounter notes, upcoming appointments, etc.  Non-urgent messages can be sent to your provider as well.   To learn more about what you can do with MyChart, go to  [email or web address]    Your next appointment:   1 month(s)  The format for your next appointment:   In Person  Provider:    You may see CNelva Bush MD or one of the following Advanced Practice Providers on your designated Care Team:    CMurray Hodgkins NP  RChristell Faith PA-C  JMarrianne Mood PA-C  CLaurann Montana NP  Other Instructions  Furosemide Oral Tablets What is this medicine? FUROSEMIDE (fyoor [place] se mide) is a diuretic. It helps you make more urine and to lose salt and excess water from your body. It treats swelling from heart, kidney, or liver disease. It also treats high blood pressure. This medicine may be used for other purposes; ask your health care provider or pharmacist if you have questions. COMMON BRAND NAME(S): Active-Medicated Specimen Kit, Delone, Diuscreen, Lasix, RX Specimen Collection Kit, Specimen Collection Kit, URINX Medicated Specimen Collection What should I tell my health care provider before I take this medicine? They need to know if you have any of these conditions:  abnormal blood electrolytes  diarrhea or vomiting  gout  heart disease  kidney disease, small amounts of urine, or difficulty passing urine  liver disease  thyroid disease  an unusual or allergic reaction to furosemide, sulfa drugs, other medicines, foods, dyes, or preservatives  pregnant or trying to get pregnant  breast-feeding How should I use this medicine? Take this drug by mouth. Take it as directed on the prescription label at  the same time every day. You can take it with or without food. If it upsets your stomach, take it with food. Keep taking it unless your health care provider tells you to stop. Talk to your health care provider about the use of this drug in children. Special care may be needed. Overdosage: If you think you have taken too much of this medicine contact a poison control center or emergency room at once. NOTE: This medicine is only for  you. Do not share this medicine with others. What if I miss a dose? If you miss a dose, take it as soon as you can. If it is almost time for your next dose, take only that dose. Do not take double or extra doses. What may interact with this medicine?  aspirin and aspirin-like medicines  certain antibiotics  chloral hydrate  cisplatin  cyclosporine  digoxin  diuretics  laxatives  lithium  medicines for blood pressure  medicines that relax muscles for surgery  methotrexate  NSAIDs, medicines for pain and inflammation like ibuprofen, naproxen, or indomethacin  phenytoin  steroid medicines like prednisone or cortisone  sucralfate  thyroid hormones This list may not describe all possible interactions. Give your health care provider a list of all the medicines, herbs, non-prescription drugs, or dietary supplements you use. Also tell them if you smoke, drink alcohol, or use illegal drugs. Some items may interact with your medicine. What should I watch for while using this medicine? Visit your doctor or health care provider for regular checks on your progress. Check your blood pressure regularly. Ask your doctor or health care provider what your blood pressure should be, and when you should contact him or her. If you are a diabetic, check your blood sugar as directed. This medicine may cause serious skin reactions. They can happen weeks to months after starting the medicine. Contact your health care provider right away if you notice fevers or flu-like symptoms with a rash. The rash may be red or purple and then turn into blisters or peeling of the skin. Or, you might notice a red rash with swelling of the face, lips or lymph nodes in your neck or under your arms. You may need to be on a special diet while taking this medicine. Check with your doctor. Also, ask how many glasses of fluid you need to drink a day. You must not get dehydrated. You may get drowsy or dizzy. Do not drive,  use machinery, or do anything that needs mental alertness until you know how this drug affects you. Do not stand or sit up quickly, especially if you are an older patient. This reduces the risk of dizzy or fainting spells. Alcohol can make you more drowsy and dizzy. Avoid alcoholic drinks. This medicine can make you more sensitive to the sun. Keep out of the sun. If you cannot avoid being in the sun, wear protective clothing and use sunscreen. Do not use sun lamps or tanning beds/booths. What side effects may I notice from receiving this medicine? Side effects that you should report to your doctor or health care professional as soon as possible:  blood in urine or stools  dry mouth  fever or chills  hearing loss or ringing in the ears  irregular heartbeat  muscle pain or weakness, cramps  rash, fever, and swollen lymph nodes  redness, blistering, peeling or loosening of the skin, including inside the mouth  skin rash  stomach upset, pain, or nausea  tingling or numbness in  the hands or feet  unusually weak or tired  vomiting or diarrhea  yellowing of the eyes or skin Side effects that usually do not require medical attention (report to your doctor or health care professional if they continue or are bothersome):  headache  loss of appetite  unusual bleeding or bruising This list may not describe all possible side effects. Call your doctor for medical advice about side effects. You may report side effects to FDA at 1-800-FDA-1088. Where should I keep my medicine? Keep out of the reach of children and pets. Store at room temperature between 20 and 25 degrees C (68 and 77 degrees F). Protect from light and moisture. Keep the container tightly closed. Throw away any unused drug after the expiration date. NOTE: This sheet is a summary. It may not cover all possible information. If you have questions about this medicine, talk to your doctor, pharmacist, or health care provider.   2020 Elsevier/Gold Standard (2019-07-13 18:01:32)

## 2020-06-07 NOTE — Progress Notes (Signed)
Office Visit    Patient Name: Rhonda Oconnell Date of Encounter: 06/08/2020  Primary Care Provider:  Kandyce Rud, MD Primary Cardiologist:  Yvonne Kendall, MD Electrophysiologist:  None   Chief Complaint    Rhonda Oconnell is a 69 y.o. 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   Past Medical History    Past Medical History:  Diagnosis Date   Abdominal hernia    Allergic state    Anemia    Arthritis    Asthma    Cataract cortical, senile    COPD (chronic obstructive pulmonary disease) (HCC)    Dysphagia    GERD (gastroesophageal reflux disease)    Hemorrhoids    Hyperlipidemia    Hypertension    Rectal tear    Rectocele    Past Surgical History:  Procedure Laterality Date   ABDOMINAL HYSTERECTOMY     ARTHOSCOPIC ROTAOR CUFF REPAIR     BLADDER REPAIR     CATARACT EXTRACTION     CHOLECYSTECTOMY     COLONOSCOPY WITH PROPOFOL N/A 07/22/2016   Procedure: COLONOSCOPY WITH PROPOFOL;  Surgeon: Scot Jun, MD;  Location: [facility] ENDOSCOPY;  Service: Endoscopy;  Laterality: N/A;   ESOPHAGOGASTRODUODENOSCOPY (EGD) WITH PROPOFOL N/A 07/22/2016   Procedure: ESOPHAGOGASTRODUODENOSCOPY (EGD) WITH PROPOFOL;  Surgeon: Scot Jun, MD;  Location: [facility] ENDOSCOPY;  Service: Endoscopy;  Laterality: N/A;   HEMORRHOID SURGERY     HERNIA REPAIR     LAPAROSCOPIC SPLENECTOMY     TUBAL LIGATION      Allergies  Allergies  Allergen Reactions   Iodinated Diagnostic Agents Hives   Iodine Hives   Sulfa Antibiotics Hives    History of Present Illness    Rhonda Oconnell is a 69 y.o. 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 Okey Dupre 04/19/20.  Echo 03/29/20 with  LVEF 55-60%, gr2DD, RV normal size and function, tricuspid aortic valve with mild sclerosis without stenosis.   At that time she was recommended for [facility] in the setting of diastolic dysfunction and DOE. Her HCTZ was increased to 25mg  daily for gentle diuresis and improvement in BP. However, she did not tolerate as she reported she wasn't able to urinate with increased dose and self-reduced to 12.5mg  daily.  Has been put on oxygen at night which makes a tremendous difference. Feels much more rested. Reports improvement in her DOE.   Just got back from the beach and noted her legs were remarkedly swollen. She has been wearing compression stockings at night with improvement.   Can't urinate with HCTZ 25mg  - cut back to 12.5mg  daily.   EKGs/Labs/Other Studies Reviewed:   The following studies were reviewed today:   EKG:  EKG is ordered today.  The ekg ordered today demonstrates ST 100 bpm with no acute ST/T wave changes.   Recent Labs: No results found for requested labs within last 8760 hours.  Recent Lipid Panel No results found for: CHOL, TRIG, HDL, CHOLHDL, VLDL, LDLCALC, LDLDIRECT  Home Medications   Current Meds  Medication Sig   acetaminophen (TYLENOL) 500 MG tablet Take 500 mg by mouth every 6 (six) hours as needed.   albuterol (VENTOLIN HFA) 108 (90 Base) MCG/ACT inhaler Inhale 2 puffs into the lungs every 6 (  six) hours as needed for wheezing or shortness of breath.   aspirin EC 81 MG tablet Take 81 mg by mouth daily.   atorvastatin (LIPITOR) 20 MG tablet Take 20 mg by mouth daily.   Calcium Carbonate-Vitamin D (OSCAL 500/200 D-3 PO) Take 1 tablet by mouth 2 (two) times daily.   cetirizine (ZYRTEC) 10 MG tablet Take by mouth.   chlorpheniramine (CHLOR-TRIMETON) 4 MG tablet Take 4 mg by mouth 2 (two) times daily as needed for allergies.   diphenhydramine-acetaminophen (TYLENOL PM) 25-500 MG TABS tablet Take 1 tablet by mouth at bedtime as needed.    fluticasone (FLONASE) 50 MCG/ACT nasal spray Place into the nose.   Fluticasone-Umeclidin-Vilant (TRELEGY ELLIPTA) 200-62.5-25 MCG/INH AEPB Inhale 1 puff into the lungs daily.   hydrochlorothiazide (HYDRODIURIL) 25 MG tablet Take 0.5 tablet (12.5 mg) by mouth once daily   losartan (COZAAR) 100 MG tablet Take 100 mg by mouth daily.   montelukast (SINGULAIR) 10 MG tablet Take 10 mg by mouth at bedtime.   Multiple Vitamin (MULTIVITAMIN) tablet Take 1 tablet by mouth daily.   omeprazole (PRILOSEC) 20 MG capsule Take 20 mg by mouth 2 (two) times daily before a meal.   OXYGEN Inhale 2 L into the lungs at bedtime.   Spacer/Aero-Holding Chambers DEVI 1 each by Does not apply route daily.   [DISCONTINUED] hydrochlorothiazide (HYDRODIURIL) 25 MG tablet Take 1 tablet (25 mg total) by mouth daily. (Patient taking differently: Take 12.5 mg by mouth daily. )      Review of Systems     Review of Systems  Constitutional: Negative for chills, fever and malaise/fatigue.  Cardiovascular: Positive for dyspnea on exertion. Negative for chest pain, leg swelling, near-syncope, orthopnea, palpitations and syncope.  Respiratory: Negative for cough, shortness of breath and wheezing.   Gastrointestinal: Negative for nausea and vomiting.  Neurological: Negative for dizziness, light-headedness and weakness.   All other systems reviewed and are otherwise negative except as noted above.  Physical Exam    VS:  BP 130/90 (BP Location: Left Arm, Patient Position: Sitting, Cuff Size: Normal)    Pulse 100    Ht 5\' 2"  (1.575 m)    Wt 230 lb 8 oz (104.6 kg)    SpO2 96%    BMI 42.16 kg/m  , BMI Body mass index is 42.16 kg/m. GEN: Well nourished, well developed, in no acute distress. HEENT: normal. Neck: Supple, no JVD, carotid bruits, or masses. Cardiac: RRR, no murmurs, rubs, or gallops. No clubbing, cyanosis, edema.  Radials/DP/PT 2+ and equal bilaterally.  Respiratory:  Respirations regular and unlabored,  clear to auscultation bilaterally. GI: Soft, nontender, nondistended, BS + x 4. MS: No deformity or atrophy. Skin: Warm and dry, no rash. Neuro:  Strength and sensation are intact. Psych: Normal affect.  Assessment & Plan    1. DOE - Multifactorial morbid obesity, COPD/asthma, diastolic dysfunction. Lexiscan myoview 05/2020 low risk study with no evidence of ischemia.   2. HFpEF - Reports some LE edema over the last week while at the beach. Discussed sodium restriction. Did not tolerate HCTZ 25mg  and has returned to 12.5mg  daily. Start Lasix 20mg  daily. BMP in 2 weeks.   3. HLD - Continue Atorvastatin 20mg  daily.   4. HTN - BP at hom 130/80. Start Lasix, as above. Goal BP <130/80. Continue HCTZ 12.mg daily, Losartan 100mg  daily.   5. Morbid obesity - Weight loss via diet and exercise encouraged.   6. COPD/asthma - Follows with Dr. 06/2020 of pulmonology.  Disposition: Follow up in 1 month(s) with Dr. Okey Dupre or APP   Alver Sorrow, NP 06/08/2020, 12:56 PM

## 2020-06-08 ENCOUNTER — Encounter: Payer: Self-pay | Admitting: Family

## 2020-06-15 ENCOUNTER — Ambulatory Visit: Payer: Medicare Other | Attending: Pulmonary Disease

## 2020-06-15 DIAGNOSIS — G4733 Obstructive sleep apnea (adult) (pediatric): Secondary | ICD-10-CM | POA: Insufficient documentation

## 2020-06-15 DIAGNOSIS — G4736 Sleep related hypoventilation in conditions classified elsewhere: Secondary | ICD-10-CM | POA: Diagnosis not present

## 2020-06-16 ENCOUNTER — Other Ambulatory Visit: Payer: Self-pay

## 2020-06-20 ENCOUNTER — Other Ambulatory Visit
Admission: RE | Admit: 2020-06-20 | Discharge: 2020-06-20 | Disposition: A | Discharge status: Home / Self Care | Payer: Medicare Other | Attending: Family | Admitting: Family

## 2020-06-20 DIAGNOSIS — Z79899 Other long term (current) drug therapy: Secondary | ICD-10-CM | POA: Diagnosis present

## 2020-06-20 DIAGNOSIS — I5032 Chronic diastolic (congestive) heart failure: Secondary | ICD-10-CM | POA: Insufficient documentation

## 2020-06-20 LAB — BASIC METABOLIC PANEL
Anion gap: 12 (ref 5–15)
BUN: 15 mg/dL (ref 8–23)
CO2: 27 mmol/L (ref 22–32)
Calcium: 9.7 mg/dL (ref 8.9–10.3)
Chloride: 98 mmol/L (ref 98–111)
Creatinine, Ser: 0.82 mg/dL (ref 0.44–1.00)
GFR calc Af Amer: >60 mL/min (ref >60)
GFR calc non Af Amer: >60 mL/min (ref >60)
Glucose, Bld: 100 mg/dL — ABNORMAL HIGH (ref 70–99)
Potassium: 4 mmol/L (ref 3.5–5.1)
Sodium: 137 mmol/L (ref 135–145)

## 2020-06-27 DIAGNOSIS — G4733 Obstructive sleep apnea (adult) (pediatric): Secondary | ICD-10-CM | POA: Diagnosis not present

## 2020-06-28 ENCOUNTER — Telehealth: Payer: Self-pay | Admitting: Pulmonary Disease

## 2020-06-28 DIAGNOSIS — G4733 Obstructive sleep apnea (adult) (pediatric): Secondary | ICD-10-CM

## 2020-06-28 NOTE — Telephone Encounter (Signed)
Sleep study reviewed by Dr. Vassie Loll-- recommend PAP titration. Auto cpap can be tried, PAP titration may be a better option given underlying COPD & severity of desaturations.  Pt is aware of results and voiced her understanding.  Pt stated that she would like to think about options, and give [place] a call back once she has decided.

## 2020-07-03 NOTE — Telephone Encounter (Signed)
Spoke to pt, who wished to proceed with cpap titration. Order has been placed.  Nothing further is needed at this time.

## 2020-07-11 ENCOUNTER — Ambulatory Visit: Payer: Medicare Other | Attending: Pulmonary Disease

## 2020-07-11 DIAGNOSIS — G4733 Obstructive sleep apnea (adult) (pediatric): Secondary | ICD-10-CM | POA: Insufficient documentation

## 2020-07-11 DIAGNOSIS — I110 Hypertensive heart disease with heart failure: Secondary | ICD-10-CM | POA: Insufficient documentation

## 2020-07-11 DIAGNOSIS — J984 Other disorders of lung: Secondary | ICD-10-CM | POA: Insufficient documentation

## 2020-07-11 DIAGNOSIS — Z6841 Body Mass Index (BMI) 40.0 and over, adult: Secondary | ICD-10-CM | POA: Insufficient documentation

## 2020-07-11 DIAGNOSIS — I509 Heart failure, unspecified: Secondary | ICD-10-CM | POA: Diagnosis not present

## 2020-07-12 ENCOUNTER — Ambulatory Visit (INDEPENDENT_AMBULATORY_CARE_PROVIDER_SITE_OTHER): Payer: Medicare Other | Admitting: Family

## 2020-07-12 ENCOUNTER — Other Ambulatory Visit: Payer: Self-pay

## 2020-07-12 ENCOUNTER — Encounter: Payer: Self-pay | Admitting: Family

## 2020-07-12 VITALS — BP 130/90 | HR 99 | Ht 62.0 in | Wt 231.2 lb

## 2020-07-12 DIAGNOSIS — I491 Atrial premature depolarization: Secondary | ICD-10-CM | POA: Diagnosis not present

## 2020-07-12 DIAGNOSIS — I5032 Chronic diastolic (congestive) heart failure: Secondary | ICD-10-CM | POA: Diagnosis not present

## 2020-07-12 DIAGNOSIS — I10 Essential (primary) hypertension: Secondary | ICD-10-CM | POA: Diagnosis not present

## 2020-07-12 MED ORDER — FUROSEMIDE 20 MG PO TABS: 20.0000 mg | ORAL_TABLET | Freq: Every day | ORAL | 6 refills | Status: DC | PRN

## 2020-07-12 NOTE — Patient Instructions (Addendum)
Medication Instructions:  Your physician has recommended you make the following change in your medication:   CHANGE Furosemide (Lasix) to 20mg  (one tablet) as needed for lower extremity swelling  *If you need a refill on your cardiac medications before your next appointment, please call your pharmacy*  Lab Work: No lab work.   Testing/Procedures: Your EKG today shows normal sinus rhythm with occasional early beat in the top chambers of your heart called a PAC or premature atrial contraction. These are very common and not dangerous. They can feel like your heart "jumps" or "skips a beat".   Follow-Up: At [facility], you and your health needs are our priority.  As part of our continuing mission to provide you with exceptional heart care, we have created designated Provider Care Teams.  These Care Teams include your primary Cardiologist (physician) and Advanced Practice Providers (APPs -  Physician Assistants and Nurse Practitioners) who all work together to provide you with the care you need, when you need it.  We recommend signing up for the patient portal called "MyChart".  Sign up information is provided on this After Visit Summary.  MyChart is used to connect with patients for Virtual Visits (Telemedicine).  Patients are able to view lab/test results, encounter notes, upcoming appointments, etc.  Non-urgent messages can be sent to your provider as well.   To learn more about what you can do with MyChart, go to [facility].    Your next appointment:   2 month(s)  The format for your next appointment:   In Person  Provider:   You may see [email or web address], MD or one of the following Advanced Practice Providers on your designated Care Team:    Yvonne Kendall, NP  Nicolasa Ducking, PA-C  Eula Listen, PA-C  Marisue Ivan, NP  Other Instructions  Continue low salt diet.   Continue to elevate your lower extremities when sitting.    Premature Atrial  Contraction  A premature atrial contraction [facility]) is a kind of irregular heartbeat (arrhythmia). It happens when the heart beats too early and then pauses before beating again. The heart has four areas, or chambers. Normally, electrical signals spread across the heart and make all the chambers beat together. During a PAC, the upper chambers of the heart (atria) beat too early, before they have had time to fill with blood. The heartbeat pauses afterward so the heart can fill with blood for the next beat. Sometimes PAC can be a warning sign of another type of arrhythmia called atrial fibrillation. Atrial fibrillation may allow blood to pool in the atria and form clots. If a clot travels to the brain, it can cause a stroke. What are the causes? The cause of this condition is often unknown. Sometimes, this condition may be caused by heart disease or injury to the heart. What increases the risk? You are more likely to develop this condition if:  You are a child.  You are an adult who is 2 years of age or older. Episodes may be triggered by:  Caffeine.  Alcohol.  Tobacco use.  Stimulant drugs.  Some medicines or supplements.  Stress.  Heart disease. What are the signs or symptoms? Symptoms of this condition include:  A feeling that your heart skipped a beat. The first heartbeat after the "skipped" beat may feel more forceful.  A feeling that your heart is fluttering. How is this diagnosed? This condition is diagnosed based on:  Your symptoms.  A physical exam. Your health care provider may  listen to your heart.  An electrocardiogram (ECG). This is a test that records the electrical impulses of the heart.  An ambulatory cardiac monitor. This device records your heartbeats for 24 hours or more. You may also have:  An echocardiogram to check for any heart conditions. This is a type of imaging test that uses sound waves (ultrasound) to make images of your heart.  Blood  tests. How is this treated? Treatment depends on the frequency of your symptoms and other risk factors. Treatments may include:  Medicines (beta-blockers).  Catheter ablation. This is done to destroy the part of the heart tissue that sends abnormal signals. In some cases, treatment may not be needed for this condition. Follow these instructions at home: Lifestyle  Do not use any products that contain nicotine or tobacco, such as cigarettes, e-cigarettes, and chewing tobacco. If you need help quitting, ask your health care provider.  Exercise regularly. Ask your health care provider what type of exercise is safe for you.  Find healthy ways to manage stress.  Try to get at least 7-9 hours of sleep each night, or as much as recommended by your health care provider. Alcohol use  Do not drink alcohol if: ? Your health care provider tells you not to drink. ? You are pregnant, may be pregnant, or are planning to become pregnant. ? Alcohol triggers your episodes.  If you drink alcohol: ? Limit how much you use to:  0-1 drink a day for women.  0-2 drinks a day for men. ? Be aware of how much alcohol is in your drink. In the [place] one drink equals one 12 oz bottle of beer (355 mL), one 5 oz glass of wine (148 mL), or one 1 oz glass of hard liquor (44 mL). General instructions  Take over-the-counter and prescription medicines only as told by your health care provider.  If caffeine triggers episodes, do not eat, drink, or use anything with caffeine in it.  Keep all follow-up visits as told by your health care provider. This is important. Contact a health care provider if:  You feel your heart skipping beats.  Your heart skips beats and you feel dizzy, light-headed, or very tired. Get help right away if you have:  Chest pain.  Trouble breathing.  Any symptoms of a stroke. "BE FAST" is an easy way to remember the main warning signs of a stroke. ? B - Balance. Signs are  dizziness, sudden trouble walking, or loss of balance. ? E - Eyes. Signs are trouble seeing or a sudden change in vision. ? F - Face. Signs are sudden weakness or numbness of the face, or the face or eyelid drooping on one side. ? A - Arms. Signs are weakness or numbness in an arm. This happens suddenly and usually on one side of the body. ? S - Speech. Signs are sudden trouble speaking, slurred speech, or trouble understanding what people say. ? T - Time. Time to call emergency services. Write down what time symptoms started.  Other signs of stroke, such as: ? A sudden, severe headache with no known cause. ? Nausea or vomiting. ? Seizure. These symptoms may represent a serious problem that is an emergency. Do not wait to see if the symptoms will go away. Get medical help right away. Call your local emergency services (911 in the [place] Do not drive yourself to the hospital. Summary  A premature atrial contraction [facility]) is a kind of irregular heartbeat (arrhythmia). It happens when  the heart beats too early and then pauses before beating again.  Treatment depends on your symptoms and whether you have other underlying heart conditions.  Contact a health care provider if your heart skips beats and you feel dizzy, light-headed, or very tired.  In some cases, this condition may lead to a stroke. "BE FAST" is an easy way to remember the warning signs of stroke. Get help right away if you have any of the "BE FAST" signs. This information is not intended to replace advice given to you by your health care provider. Make sure you discuss any questions you have with your health care provider. Document Revised: 08/20/2018 Document Reviewed: 08/20/2018 Elsevier Patient Education  2020 [employer].

## 2020-07-12 NOTE — Progress Notes (Signed)
Office Visit    Patient Name: Rhonda Oconnell Date of Encounter: 07/12/2020  Primary Care Provider:  Kandyce Rud, MD Primary Cardiologist:  Yvonne Kendall, MD Electrophysiologist:  None   Chief Complaint    Rhonda Oconnell is a 69 y.o. 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  Past Medical History    Past Medical History:  Diagnosis Date  . Abdominal hernia   . Allergic state   . Anemia   . Arthritis   . Asthma   . Cataract cortical, senile   . COPD (chronic obstructive pulmonary disease) (HCC)   . Dysphagia   . GERD (gastroesophageal reflux disease)   . Hemorrhoids   . Hyperlipidemia   . Hypertension   . Rectal tear   . Rectocele    Past Surgical History:  Procedure Laterality Date  . ABDOMINAL HYSTERECTOMY    . ARTHOSCOPIC ROTAOR CUFF REPAIR    . BLADDER REPAIR    . CATARACT EXTRACTION    . CHOLECYSTECTOMY    . COLONOSCOPY WITH PROPOFOL N/A 07/22/2016   Procedure: COLONOSCOPY WITH PROPOFOL;  Surgeon: Scot Jun, MD;  Location: [facility] ENDOSCOPY;  Service: Endoscopy;  Laterality: N/A;  . ESOPHAGOGASTRODUODENOSCOPY (EGD) WITH PROPOFOL N/A 07/22/2016   Procedure: ESOPHAGOGASTRODUODENOSCOPY (EGD) WITH PROPOFOL;  Surgeon: Scot Jun, MD;  Location: [facility] ENDOSCOPY;  Service: Endoscopy;  Laterality: N/A;  . HEMORRHOID SURGERY    . HERNIA REPAIR    . LAPAROSCOPIC SPLENECTOMY    . TUBAL LIGATION      Allergies  Allergies  Allergen Reactions  . Iodinated Diagnostic Agents Hives  . Iodine Hives  . Sulfa Antibiotics Hives    History of Present Illness    Rhonda Oconnell is a 69 y.o. 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  Echo 03/29/20 with  LVEF 55-60%, gr2DD, RV normal size and function, tricuspid aortic valve with mild sclerosis without stenosis.   At that time she was recommended for [facility] in the setting of diastolic dysfunction and DOE. Her HCTZ was increased to 25mg  daily for gentle diuresis and improvement in BP. However, she did not tolerate as she reported she wasn't able to urinate with increased dose and self-reduced to 12.5mg  daily.  She follows with pulmonology and is on oxygen at night which improved her DOE. When last seen 06/07/20 she noted increased LE edema. She was started on Lasix 20mg  daily. Her BMP 2 weeks after starting Lasix showed normal renal function and electrolytes.   Since last seen she had sleep study and was recommended for CPAP titration which was performed yesterday.   Reports feeling overall well.  She has had complete resolution of her lower extremity edema since addition of Lasix.  She does note urinary frequency with Lasix and is interesting utilizing it only as needed.  Reports her dyspnea on exertion is stable at her baseline.  Reports no shortness of breath at rest.  Reports no chest pain, pressure, or tightness. No edema, orthopnea, PND. Reports no palpitations.    EKGs/Labs/Other Studies Reviewed:   The following studies were reviewed today:  Echo 03/29/2020 1. Left ventricular ejection fraction, by estimation, is 55 to 60%. The  left ventricle has normal function. The left  ventricle has no regional  wall motion abnormalities. Left ventricular diastolic parameters are  consistent with Grade II diastolic  dysfunction (pseudonormalization).   2. Right ventricular systolic function is normal. The right ventricular  size is normal.   3. The mitral valve is grossly normal. No evidence of mitral valve  regurgitation.   4. Tricuspid valve regurgitation not assessed.   5. The aortic valve is tricuspid. Aortic valve regurgitation is not  visualized. Mild aortic valve sclerosis is  present, with no evidence of  aortic valve stenosis.   6. Pulmonic valve regurgitation not assessed.   EKG:  EKG is ordered today.  The ekg ordered today demonstrates SR 99 bpm with occasional PAC.  Recent Labs: 06/20/2020: BUN 15; Creatinine, Ser 0.82; Potassium 4.0; Sodium 137  Recent Lipid Panel No results found for: CHOL, TRIG, HDL, CHOLHDL, VLDL, LDLCALC, LDLDIRECT  Home Medications   Current Meds  Medication Sig  . acetaminophen (TYLENOL) 500 MG tablet Take 500 mg by mouth every 6 (six) hours as needed.  Marland Kitchen albuterol (VENTOLIN HFA) 108 (90 Base) MCG/ACT inhaler Inhale 2 puffs into the lungs every 6 (six) hours as needed for wheezing or shortness of breath.  Marland Kitchen aspirin EC 81 MG tablet Take 81 mg by mouth daily.  Marland Kitchen atorvastatin (LIPITOR) 20 MG tablet Take 20 mg by mouth daily.  . Calcium Carbonate-Vitamin D (OSCAL 500/200 D-3 PO) Take 1 tablet by mouth 2 (two) times daily.  . cetirizine (ZYRTEC) 10 MG tablet Take 10 mg by mouth daily.   . chlorpheniramine (CHLOR-TRIMETON) 4 MG tablet Take 4 mg by mouth 2 (two) times daily as needed for allergies.  . diphenhydramine-acetaminophen (TYLENOL PM) 25-500 MG TABS tablet Take 1 tablet by mouth at bedtime as needed.  . fluticasone (FLONASE) 50 MCG/ACT nasal spray Place 2 sprays into the nose daily.   . Fluticasone-Umeclidin-Vilant (TRELEGY ELLIPTA) 200-62.5-25 MCG/INH AEPB Inhale 1 puff into the lungs daily.  . furosemide (LASIX) 20 MG tablet Take 1 tablet (20 mg total) by mouth daily as needed for fluid or edema.  . hydrochlorothiazide (HYDRODIURIL) 25 MG tablet Take 0.5 tablet (12.5 mg) by mouth once daily  . losartan (COZAAR) 100 MG tablet Take 100 mg by mouth daily.  . montelukast (SINGULAIR) 10 MG tablet Take 10 mg by mouth at bedtime.  . Multiple Vitamin (MULTIVITAMIN) tablet Take 1 tablet by mouth daily.  Marland Kitchen omeprazole (PRILOSEC) 20 MG capsule Take 20 mg by mouth 2 (two) times daily before a meal.  . OXYGEN Inhale 2 L into the lungs at  bedtime.  Marland Kitchen Spacer/Aero-Holding Chambers DEVI 1 each by Does not apply route daily.  . [DISCONTINUED] furosemide (LASIX) 20 MG tablet Take 1 tablet (20 mg total) by mouth daily.      Review of Systems   Review of Systems  Constitutional: Negative for chills, fever and malaise/fatigue.  Cardiovascular: Positive for dyspnea on exertion. Negative for chest pain, leg swelling, near-syncope, orthopnea, palpitations and syncope.  Respiratory: Negative for cough, shortness of breath and wheezing.   Gastrointestinal: Negative for nausea and vomiting.  Neurological: Negative for dizziness, light-headedness and weakness.   All other systems reviewed and are otherwise negative except as noted above.  Physical Exam    VS:  BP 130/90 (BP Location: Left Arm, Patient Position: Sitting, Cuff Size: Large)   Pulse 99   Ht 5\' 2"  (1.575 m)   Wt 231 lb 4 oz (104.9 kg)   SpO2 95%   BMI 42.30 kg/m  , BMI  Body mass index is 42.3 kg/m. GEN: Well nourished, overweight, well developed, in no acute distress. HEENT: normal. Neck: Supple, no JVD, carotid bruits, or masses. Cardiac: RRR, no murmurs, rubs, or gallops. No clubbing, cyanosis, edema.  Radials/DP/PT 2+ and equal bilaterally.  Respiratory:  Respirations regular and unlabored, clear to auscultation bilaterally. GI: Soft, nontender, nondistended, BS + x 4. MS: No deformity or atrophy. Skin: Warm and dry, no rash.  Varicose veins noted bilateral lower extremity. Neuro:  Strength and sensation are intact. Psych: Normal affect.  Assessment & Plan   1. HFpEF - Echo 03/29/20 with EF 55-60%, gr2DD.  Euvolemic and well compensated on exam.  Since starting Lasix 20mg  daily reports no recurrent lower extremity edema.  Her DOE is stable at baseline and likely multifactorial deconditioning, COPD, asthma, HFpEF.  We will change her Lasix to 20 mg daily as needed for lower extremity edema.   2. HLD - Continue Atorvastatin 20mg  daily.   3. PAC/tachycardia-EKG  today sinus rhythm 99 bpm with occasional PAC.  She reports no symptoms of palpitations.  Would avoid beta-blocker in the setting of COPD.  Could consider cardioselective beta-blocker in the future as needed for symptomatic PACs such as bisoprolol.  Encouraged to avoid caffeine.  No noted proarrhythmic medications.  4. HTN - BP well controlled. Continue current antihypertensive regimen.   5. Morbid obesity -BMI greater than 40%.  Weight loss via diet and exercise encouraged.  Plans to start a walking regimen.  6. COPD/asthma - Follows with Dr. of pulmonology.   7. OSA -CPAP titration yesterday.  We discussed the importance of CPAP compliance and heart failure and she verbalized understanding.  Disposition: Follow up in 2 month(s) with Dr. or APP   Jayme Cloud, NP 07/12/2020, 2:49 PM

## 2020-07-17 ENCOUNTER — Other Ambulatory Visit: Payer: Self-pay

## 2020-07-17 ENCOUNTER — Telehealth: Payer: Self-pay | Admitting: Pulmonary Disease

## 2020-07-17 NOTE — Telephone Encounter (Signed)
Patient is aware that sleep study has not been read by MD as of yet.  Advised patient that she would receive a call once study has been read.  Pt voiced her understanding and had no further questions at this time.

## 2020-07-21 ENCOUNTER — Telehealth: Payer: Self-pay | Admitting: Pulmonary Disease

## 2020-07-21 DIAGNOSIS — G4733 Obstructive sleep apnea (adult) (pediatric): Secondary | ICD-10-CM

## 2020-07-21 DIAGNOSIS — J449 Chronic obstructive pulmonary disease, unspecified: Secondary | ICD-10-CM

## 2020-07-21 NOTE — Telephone Encounter (Signed)
Patient is aware of below message and voiced her understanding.  Patient is okay with waiting on results.

## 2020-07-21 NOTE — Telephone Encounter (Signed)
Called and spoke to patient, who is requesting sleep study results ASAP. Patient is aware that sleep study has not been read by MD. Advised patient that our office would contact her with results once read.    Dr. Vassie Loll, please advise on results.

## 2020-07-21 NOTE — Telephone Encounter (Signed)
I have been working at the hospital and unable to read this until next Friday

## 2020-07-27 ENCOUNTER — Encounter: Payer: Self-pay | Admitting: Pulmonary Disease

## 2020-07-27 ENCOUNTER — Ambulatory Visit: Payer: Medicare Other | Admitting: Pulmonary Disease

## 2020-07-27 ENCOUNTER — Other Ambulatory Visit: Payer: Self-pay

## 2020-07-27 VITALS — BP 128/80 | HR 86 | Temp 96.8°F | Ht 62.0 in | Wt 230.4 lb

## 2020-07-27 DIAGNOSIS — R059 Cough, unspecified: Secondary | ICD-10-CM

## 2020-07-27 NOTE — Progress Notes (Signed)
 Assessment & Plan:  1. Cough, unspecified type (Primary)   Patient Instructions  We are awaiting the results of your titration study.  We will order the equipment as necessary.  See you in follow-up in 3 months time call sooner should any new problems arise.  We discussed Covid-19 precautions.  I reviewed the vaccine effectiveness and potential side effects in detail to include differences between mRNA vaccines and traditional vaccines (attenuated virus).  Discussion also offered of long-term effectiveness and safety profile which are unclear at this time.  Discussed current CDC guidance that all patients are recommended COVID-19 vaccinations [when available]-as long as they do not have allergy to components of the vaccine.   Please note: late entry documentation due to logistical difficulties during COVID-19 pandemic. This note is filed for information purposes only, and is not intended to be used for billing, nor does it represent the full scope/nature of the visit in question. Please see any associated scanned media linked to date of encounter for additional pertinent information.  Subjective:    HPI: Rhonda Oconnell is a 69 y.o. female presenting to the pulmonology clinic on 07/27/2020 with report of: Follow-up (c/o non prod cough and sob with exertion. on 2L QHS)     Outpatient Encounter Medications as of 07/27/2020  Medication Sig   acetaminophen  (TYLENOL ) 500 MG tablet Take 1,000 mg by mouth every 6 (six) hours as needed for moderate pain (pain score 4-6).   atorvastatin  (LIPITOR) 20 MG tablet Take 20 mg by mouth at bedtime.   chlorpheniramine (CHLOR-TRIMETON) 4 MG tablet Take 4 mg by mouth at bedtime.   diphenhydramine -acetaminophen  (TYLENOL  PM) 25-500 MG TABS tablet Take 2 tablets by mouth at bedtime.   losartan  (COZAAR ) 100 MG tablet Take 100 mg by mouth at bedtime.   montelukast  (SINGULAIR ) 10 MG tablet Take 10 mg by mouth at bedtime.   Multiple Vitamin (MULTIVITAMIN)  tablet Take 1 tablet by mouth daily.   Spacer/Aero-Holding Chambers DEVI 1 each by Does not apply route daily.   [DISCONTINUED] albuterol  (VENTOLIN  HFA) 108 (90 Base) MCG/ACT inhaler Inhale 2 puffs into the lungs every 6 (six) hours as needed for wheezing or shortness of breath.   [DISCONTINUED] aspirin  EC 81 MG tablet Take 81 mg by mouth daily.   [DISCONTINUED] Calcium  Carbonate-Vitamin D (OSCAL 500/200 D-3 PO) Take 1 tablet by mouth 2 (two) times daily.   [DISCONTINUED] cetirizine  (ZYRTEC ) 10 MG tablet Take 10 mg by mouth daily.    [DISCONTINUED] fluticasone  (FLONASE ) 50 MCG/ACT nasal spray Place 2 sprays into the nose daily.    [DISCONTINUED] Fluticasone -Umeclidin-Vilant (TRELEGY ELLIPTA ) 200-62.5-25 MCG/INH AEPB Inhale 1 puff into the lungs daily.   [DISCONTINUED] furosemide  (LASIX ) 20 MG tablet Take 1 tablet (20 mg total) by mouth daily as needed for fluid or edema.   [DISCONTINUED] hydrochlorothiazide  (HYDRODIURIL ) 25 MG tablet Take 0.5 tablet (12.5 mg) by mouth once daily   [DISCONTINUED] omeprazole  (PRILOSEC) 20 MG capsule Take 20 mg by mouth 2 (two) times daily before a meal.   [DISCONTINUED] OXYGEN Inhale 2 L into the lungs at bedtime. (Patient not taking: Reported on 09/13/2020)   No facility-administered encounter medications on file as of 07/27/2020.      Objective:   Vitals:   07/27/20 1117  BP: 128/80  Pulse: 86  Temp: (!) 96.8 F (36 C)  Height: 5' 2 (1.575 m)  Weight: 230 lb 6.4 oz (104.5 kg)  SpO2: 98%  TempSrc: Temporal  BMI (Calculated): 42.13  Physical exam documentation is limited by delayed entry of information.   GENERAL: Obese woman, no acute respiratory distress, fully ambulatory HEAD: Normocephalic, atraumatic.  EYES: Pupils equal, round, reactive to light.  No scleral icterus.  Mild periorbital puffiness. MOUTH: Nose/mouth/throat not examined due to masking requirements for COVID 19. NECK: Supple. No thyromegaly. Trachea midline. No JVD.  No  adenopathy. PULMONARY: Good air entry bilaterally, coarse, no other adventitious sounds. CARDIOVASCULAR: S1 and S2. Regular rate and rhythm.  Grade 1/6 to 2/6 systolic ejection murmur left sternal border.    Change from prior.   GASTROINTESTINAL: Obese, benign. MUSCULOSKELETAL: No joint deformity, no clubbing, no edema.  NEUROLOGIC: Fully ambulatory with no gait disturbance noted.  Awake, alert, speech fluent, no overt focal deficits SKIN: Intact,warm,dry.  Mild plethora about the face. PSYCH: Normal mood and behavior.

## 2020-07-27 NOTE — Patient Instructions (Signed)
We are awaiting the results of your titration study.  We will order the equipment as necessary.  See you in follow-up in 3 months time call sooner should any new problems arise.

## 2020-07-31 DIAGNOSIS — G4733 Obstructive sleep apnea (adult) (pediatric): Secondary | ICD-10-CM | POA: Diagnosis not present

## 2020-07-31 NOTE — Telephone Encounter (Signed)
Please let patient know CPAP titration study showed CPAP 11 cm required with small full facemask with good results. Prescription can be sent for CPAP with above settings if okay to be signed by Dr. Jayme Cloud

## 2020-07-31 NOTE — Telephone Encounter (Signed)
Called and spoke with pt letting her know the info stated by RA and stated to her that we were going to check with Dr. Jayme Cloud to see if she is okay signing Rx for pt's cpap. Pt verbalized understanding.  Dr. Jayme Cloud, please advise.

## 2020-07-31 NOTE — Telephone Encounter (Signed)
Dr. Vassie Loll any update on patient's results?  Please advise

## 2020-08-01 NOTE — Telephone Encounter (Signed)
Prescription for CPAP ok.

## 2020-08-01 NOTE — Telephone Encounter (Signed)
Per Dr. Jayme Cloud verbally- place order to [facility] to d/c nighttime oxygen.  Order has been placed for cpap and to d/c oxygen.  Patient is aware and voiced her understanding.  Recall has been placed for 25mo. Nothing further is needed at this time.

## 2020-08-02 ENCOUNTER — Other Ambulatory Visit: Payer: Self-pay | Admitting: Internal Medicine

## 2020-09-06 ENCOUNTER — Other Ambulatory Visit: Payer: Self-pay | Admitting: Pulmonary Disease

## 2020-09-06 MED ORDER — TRELEGY ELLIPTA 200-62.5-25 MCG/INH IN AEPB: 1.0000 | INHALATION_SPRAY | Freq: Every day | RESPIRATORY_TRACT | 3 refills | Status: DC

## 2020-09-06 NOTE — Telephone Encounter (Signed)
Rx for trelegy 200 has been sent to preferred pharmacy.  Patient is aware and voiced her understanding. Nothing further needed.

## 2020-09-13 ENCOUNTER — Encounter: Payer: Self-pay | Admitting: Family

## 2020-09-13 ENCOUNTER — Ambulatory Visit (INDEPENDENT_AMBULATORY_CARE_PROVIDER_SITE_OTHER): Payer: Medicare Other | Admitting: Family

## 2020-09-13 ENCOUNTER — Other Ambulatory Visit: Payer: Self-pay

## 2020-09-13 VITALS — BP 124/86 | HR 68 | Ht 62.0 in | Wt 235.5 lb

## 2020-09-13 DIAGNOSIS — I5032 Chronic diastolic (congestive) heart failure: Secondary | ICD-10-CM | POA: Diagnosis not present

## 2020-09-13 DIAGNOSIS — I491 Atrial premature depolarization: Secondary | ICD-10-CM

## 2020-09-13 DIAGNOSIS — I10 Essential (primary) hypertension: Secondary | ICD-10-CM

## 2020-09-13 NOTE — Progress Notes (Signed)
Office Visit    Patient Name: Rhonda Oconnell Date of Encounter: 09/13/2020  Primary Care Provider:  Kandyce Rud, MD Primary Cardiologist:  Yvonne Kendall, MD Electrophysiologist:  None   Chief Complaint    Rhonda Oconnell is a 69 y.o. 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  Past Medical History    Past Medical History:  Diagnosis Date  . Abdominal hernia   . Allergic state   . Anemia   . Arthritis   . Asthma   . Cataract cortical, senile   . COPD (chronic obstructive pulmonary disease) (HCC)   . Dysphagia   . GERD (gastroesophageal reflux disease)   . Hemorrhoids   . Hyperlipidemia   . Hypertension   . Rectal tear   . Rectocele    Past Surgical History:  Procedure Laterality Date  . ABDOMINAL HYSTERECTOMY    . ARTHOSCOPIC ROTAOR CUFF REPAIR    . BLADDER REPAIR    . CATARACT EXTRACTION    . CHOLECYSTECTOMY    . COLONOSCOPY WITH PROPOFOL N/A 07/22/2016   Procedure: COLONOSCOPY WITH PROPOFOL;  Surgeon: Scot Jun, MD;  Location: [facility] ENDOSCOPY;  Service: Endoscopy;  Laterality: N/A;  . ESOPHAGOGASTRODUODENOSCOPY (EGD) WITH PROPOFOL N/A 07/22/2016   Procedure: ESOPHAGOGASTRODUODENOSCOPY (EGD) WITH PROPOFOL;  Surgeon: Scot Jun, MD;  Location: [facility] ENDOSCOPY;  Service: Endoscopy;  Laterality: N/A;  . HEMORRHOID SURGERY    . HERNIA REPAIR    . LAPAROSCOPIC SPLENECTOMY    . TUBAL LIGATION      Allergies  Allergies  Allergen Reactions  . Iodinated Diagnostic Agents Hives  . Iodine Hives  . Sulfa Antibiotics Hives    History of Present Illness    Rhonda Oconnell is a 69 y.o. 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  Echo 03/29/20 with LVEF 55-60%, gr2DD, RV  normal size and function, tricuspid aortic valve with mild sclerosis without stenosis.   At that time she was recommended for [facility] in the setting of diastolic dysfunction and DOE. Her HCTZ was increased to 25mg  daily for gentle diuresis and improvement in BP. However, she did not tolerate as she reported she wasn't able to urinate with increased dose and self-reduced to 12.5mg  daily.  She follows with pulmonology and is on oxygen at night which improved her DOE. When last seen 06/07/20 she noted increased LE edema. She was started on Lasix 20mg  daily. Her BMP 2 weeks after starting Lasix showed normal renal function and electrolytes.   Seen in clinic 07/12/20. She had CPAP titration the night prior. Reported complete resolution of her lower extremity edema since addition of Lasix. This was changed to PRN due to urinary frequency. Her DOE was stable at baseline.   Since last seen she missed the running board of her SUV and stumbled to the ground scraping her shin. X-ray without acute abnormalities. She was treated with Keflex for 10 days due to cellulitis. After 7 days she showed minimal improvement and was started on Doxycycline.   She saw primary care via telemedicine 08/22/20 due to cough. She was started on low dose prednisone. Her COVID and flu swabs were negative.  Presents today for follow-up.  Tells me her cough is improved but she still notices it when laying  down.  Tends to resolve after a few minutes.  She has been compliant with her CPAP and reports she is tolerating it well.  Her weight is up 4 pounds compared to clinic visit 2 months ago.  Does note some dietary indiscretion on a recent trip to the beach. Tells me most days her breathing stable at her baseline. She notices her dyspnea on exertion particularly when it is more humid.   Tells me she taking her Lasix about once per week if she notices her legs swelling.  Notes no orthopnea nor PND.  She is excited for upcoming  trip to the mountains and hopeful to go to the beach for Halloween to spend time with her grandchildren and great-grandchildren.  Her newest great-grandchild is 53 weeks old.  EKGs/Labs/Other Studies Reviewed:   The following studies were reviewed today:  Echo 03/29/2020 1. Left ventricular ejection fraction, by estimation, is 55 to 60%. The  left ventricle has normal function. The left ventricle has no regional  wall motion abnormalities. Left ventricular diastolic parameters are  consistent with Grade II diastolic  dysfunction (pseudonormalization).   2. Right ventricular systolic function is normal. The right ventricular  size is normal.   3. The mitral valve is grossly normal. No evidence of mitral valve  regurgitation.   4. Tricuspid valve regurgitation not assessed.   5. The aortic valve is tricuspid. Aortic valve regurgitation is not  visualized. Mild aortic valve sclerosis is present, with no evidence of  aortic valve stenosis.   6. Pulmonic valve regurgitation not assessed.   EKG:  EKG is ordered today.  The ekg ordered today demonstrates SR 68 bpm with no acute ST/T wave changes.  Recent Labs: 06/20/2020: BUN 15; Creatinine, Ser 0.82; Potassium 4.0; Sodium 137  Recent Lipid Panel No results found for: CHOL, TRIG, HDL, CHOLHDL, VLDL, LDLCALC, LDLDIRECT  Home Medications   Current Meds  Medication Sig  . acetaminophen (TYLENOL) 500 MG tablet Take 500 mg by mouth every 6 (six) hours as needed.  Marland Kitchen albuterol (VENTOLIN HFA) 108 (90 Base) MCG/ACT inhaler Inhale 2 puffs into the lungs every 6 (six) hours as needed for wheezing or shortness of breath.  Marland Kitchen aspirin EC 81 MG tablet Take 81 mg by mouth daily.  Marland Kitchen atorvastatin (LIPITOR) 20 MG tablet Take 20 mg by mouth daily.  . Calcium Carbonate-Vitamin D (OSCAL 500/200 D-3 PO) Take 1 tablet by mouth 2 (two) times daily.  . cetirizine (ZYRTEC) 10 MG tablet Take 10 mg by mouth daily.   . chlorpheniramine (CHLOR-TRIMETON) 4 MG tablet  Take 4 mg by mouth 2 (two) times daily as needed for allergies.  . diphenhydramine-acetaminophen (TYLENOL PM) 25-500 MG TABS tablet Take 1 tablet by mouth at bedtime as needed.  . doxycycline (MONODOX) 100 MG capsule Take 100 mg by mouth 2 (two) times daily.  . fluticasone (FLONASE) 50 MCG/ACT nasal spray Place 2 sprays into the nose daily.   . Fluticasone-Umeclidin-Vilant (TRELEGY ELLIPTA) 200-62.5-25 MCG/INH AEPB Inhale 1 puff into the lungs daily.  . furosemide (LASIX) 20 MG tablet Take 1 tablet (20 mg total) by mouth daily as needed for fluid or edema.  . hydrochlorothiazide (MICROZIDE) 12.5 MG capsule Take 12.5 mg by mouth daily.  Marland Kitchen losartan (COZAAR) 100 MG tablet Take 100 mg by mouth daily.  . montelukast (SINGULAIR) 10 MG tablet Take 10 mg by mouth at bedtime.  . Multiple Vitamin (MULTIVITAMIN) tablet Take 1 tablet by mouth daily.  . NON FORMULARY CPAP at bedtime.  Marland Kitchen  omeprazole (PRILOSEC) 20 MG capsule Take 20 mg by mouth 2 (two) times daily before a meal.  . Spacer/Aero-Holding Chambers DEVI 1 each by Does not apply route daily.      Review of Systems   Review of Systems  Constitutional: Negative for chills, fever and malaise/fatigue.  Cardiovascular: Positive for dyspnea on exertion. Negative for chest pain, leg swelling, near-syncope, orthopnea, palpitations and syncope.  Respiratory: Negative for cough, shortness of breath and wheezing.   Gastrointestinal: Negative for nausea and vomiting.  Neurological: Negative for dizziness, light-headedness and weakness.   All other systems reviewed and are otherwise negative except as noted above.  Physical Exam    VS:  BP 124/86 (BP Location: Left Arm, Patient Position: Sitting, Cuff Size: Large)   Pulse 68   Ht 5\' 2"  (1.575 m)   Wt 235 lb 8 oz (106.8 kg)   SpO2 98%   BMI 43.07 kg/m  , BMI Body mass index is 43.07 kg/m. GEN: Well nourished, overweight, well developed, in no acute distress. HEENT: normal. Neck: Supple, no JVD,  carotid bruits, or masses. Cardiac: RRR, no murmurs, rubs, or gallops. No clubbing, cyanosis, edema.  Radials/DP/PT 2+ and equal bilaterally.  Respiratory:  Respirations regular and unlabored, clear to auscultation bilaterally. GI: Soft, nontender, nondistended, BS + x 4. MS: No deformity or atrophy. Skin: Warm and dry, no rash.  Varicose veins noted bilateral lower extremity. Neuro:  Strength and sensation are intact. Psych: Normal affect.  Assessment & Plan   1. HFpEF - Echo 03/29/20 with EF 55-60%, gr2DD.  Euvolemic and well compensated on exam.  Though her weight is up 4 pounds since clinic visit 2 months ago.  She takes her Lasix approximately once per week.  Does note some dietary indiscretion recently with a trip to the beach.  Encouraged to take her Lasix tomorrow.  We discussed taking Lasix consistently once per week to avoid fluid buildup.  Discussed low-sodium, heartily diet.  2. HLD -05/10/2020 LDL 100.  LDL goal less than 100.  Continue Atorvastatin 20mg  daily.  Lifestyle changes encouraged.  3. PAC/tachycardia-no recurrence by EKG today.  Reports no palpitations.  If needed in the future cardioselective beta-blocker such as bisoprolol could be considered in the setting of her COPD.  4. HTN - BP well controlled. Continue current antihypertensive regimen.   5. Morbid obesity -weight loss via diet and exercise encouraged.  We discussed adding a walking regimen.  Discussed the implications on her heart failure and hypertension.  6. COPD/asthma - Follows with Dr. 07/10/2020 of pulmonology.   7. OSA - Continued CPAP compliance encouraged  Disposition: Follow up in 4 month(s) with Dr. or APP   Jayme Cloud, NP 09/13/2020, 8:29 PM

## 2020-09-13 NOTE — Patient Instructions (Addendum)
Medication Instructions:  No medication changes today.   *If you need a refill on your cardiac medications before your next appointment, please call your pharmacy*  Lab Work: No lab work today.   Testing/Procedures: Your EKG today shows normal sinus rhythm.   Follow-Up: At [facility], you and your health needs are our priority.  As part of our continuing mission to provide you with exceptional heart care, we have created designated Provider Care Teams.  These Care Teams include your primary Cardiologist (physician) and Advanced Practice Providers (APPs -  Physician Assistants and Nurse Practitioners) who all work together to provide you with the care you need, when you need it.  We recommend signing up for the patient portal called "MyChart".  Sign up information is provided on this After Visit Summary.  MyChart is used to connect with patients for Virtual Visits (Telemedicine).  Patients are able to view lab/test results, encounter notes, upcoming appointments, etc.  Non-urgent messages can be sent to your provider as well.   To learn more about what you can do with MyChart, go to [email or web address].    Your next appointment:   4 month(s)  The format for your next appointment:   In Person  Provider:   You may see Yvonne Kendall, MD or one of the following Advanced Practice Providers on your designated Care Team:   Nicolasa Ducking, NP  Eula Listen, PA-C  Gillian Shields, NP  Marisue Ivan, PA-C  Cadence Fransico Michael, [place]  Other Instructions  Continue low salt diet.   Continue to elevate your legs when sitting.   It may be beneficial to take your Lasix on a consistent day each week to prevent holding onto fluid.

## 2020-09-25 ENCOUNTER — Encounter: Payer: Self-pay | Admitting: Pulmonary Disease

## 2020-10-25 ENCOUNTER — Other Ambulatory Visit: Payer: Self-pay

## 2020-10-25 ENCOUNTER — Ambulatory Visit (INDEPENDENT_AMBULATORY_CARE_PROVIDER_SITE_OTHER): Payer: Medicare Other | Admitting: Pulmonary Disease

## 2020-10-25 ENCOUNTER — Encounter: Payer: Self-pay | Admitting: Pulmonary Disease

## 2020-10-25 VITALS — BP 122/82 | HR 77 | Temp 97.7°F | Ht 62.0 in | Wt 228.8 lb

## 2020-10-25 DIAGNOSIS — R490 Dysphonia: Secondary | ICD-10-CM | POA: Diagnosis not present

## 2020-10-25 DIAGNOSIS — J449 Chronic obstructive pulmonary disease, unspecified: Secondary | ICD-10-CM | POA: Diagnosis not present

## 2020-10-25 DIAGNOSIS — Z6841 Body Mass Index (BMI) 40.0 and over, adult: Secondary | ICD-10-CM

## 2020-10-25 DIAGNOSIS — R053 Chronic cough: Secondary | ICD-10-CM

## 2020-10-25 DIAGNOSIS — G4733 Obstructive sleep apnea (adult) (pediatric): Secondary | ICD-10-CM | POA: Diagnosis not present

## 2020-10-25 MED ORDER — ALBUTEROL SULFATE HFA 108 (90 BASE) MCG/ACT IN AERS: 2.0000 | INHALATION_SPRAY | Freq: Four times a day (QID) | RESPIRATORY_TRACT | 6 refills | Status: DC | PRN

## 2020-10-25 NOTE — Progress Notes (Signed)
Subjective:    Patient ID: Rhonda Oconnell, female    DOB: 19-Jun-1951, 69 y.o.   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  She has issues with chronic recurrent sinusitis and issues with cyclical cough.  She has sleep apnea and was recently titrated to at 11 cm H2O. She has been compliant with her CPAP, her compliance graph shows only a few nights where usage was 4 hours but for the most part she ranges 6 to 8 hours.  She does note times when her mask will produce a "blast" of air.  She feels that her mask from the sleep lab is actually better than what she has at home.  I have advised her to discuss this with her DME company.  Overall her compliance graph looks excellent.  She continues to have issues with dry cough.  This waxes and wanes.  She cannot find any particular triggering issue except some perfumes and cold air will sometimes trigger it.  She has gastroesophageal reflux but feels that this is well controlled with Prilosec.  She is on Trelegy for her asthma/COPD overlap and feels that this controls her dyspnea well but does not control her cough completely.  She also notes occasional hoarseness.  We discussed at least a one-time visit with voice disorders center and she is agreeable to this.  If anything to reassure her.  I suspect her cough is multifactorial and she will have multiple triggers.  She has not had any fevers, chills or sweats.  No sputum production per se.  No hemoptysis.  No chest pain, no orthopnea or paroxysmal nocturnal dyspnea.  Her previous nocturnal awakenings have resolved with the use of CPAP.  She voices no other complaints she otherwise feels well and looks well.   Review of Systems A 10 point review of systems was performed and it is as noted above otherwise negative.  Patient Active Problem List   Diagnosis Date Noted  . Dyspnea on exertion  04/29/2020  . Chronic heart failure with preserved ejection fraction (HFpEF) (HCC) 04/29/2020  . Laryngopharyngeal reflux (LPR) 01/04/2019  . Acute recurrent sinusitis 01/04/2019  . Morbid obesity (HCC) 01/02/2019  . Morbid obesity with BMI of 40.0-44.9, adult (HCC) 11/24/2018  . Asthma-COPD overlap syndrome (HCC) 09/24/2016  . Varicose veins of both lower extremities with pain 09/24/2016  . 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 Essential hypertension 04/14/2014  . Other and unspecified hyperlipidemia 04/14/2014  . Allergic rhinitis 04/14/2014  . Esophageal reflux 04/14/2014   Allergies  Allergen Reactions  . Iodinated Diagnostic Agents Hives  . Iodine Hives  . Sulfa Antibiotics Hives   Current Meds  Medication Sig  . acetaminophen (TYLENOL) 500 MG tablet Take 500 mg by mouth every 6 (six) hours as needed.  Marland Kitchen aspirin EC 81 MG tablet Take 81 mg by mouth daily.  Marland Kitchen atorvastatin (LIPITOR) 20 MG tablet Take 20 mg by mouth daily.  . Calcium Carbonate-Vitamin D (OSCAL 500/200 D-3 PO) Take 1 tablet by mouth 2 (two) times daily.  . cetirizine (ZYRTEC) 10 MG tablet Take 10 mg by mouth daily.   . chlorpheniramine (CHLOR-TRIMETON) 4 MG tablet Take 4 mg by mouth 2 (two) times daily as needed for allergies.  . diphenhydramine-acetaminophen (TYLENOL PM) 25-500 MG TABS tablet Take 1 tablet by mouth at bedtime as needed.  . fluticasone (FLONASE) 50 MCG/ACT nasal spray Place 2 sprays into the nose  daily.   . Fluticasone-Umeclidin-Vilant (TRELEGY ELLIPTA) 200-62.5-25 MCG/INH AEPB Inhale 1 puff into the lungs daily.  . hydrochlorothiazide (MICROZIDE) 12.5 MG capsule Take 12.5 mg by mouth daily.  Marland Kitchen losartan (COZAAR) 100 MG tablet Take 100 mg by mouth daily.  . montelukast (SINGULAIR) 10 MG tablet Take 10 mg by mouth at bedtime.  . Multiple Vitamin (MULTIVITAMIN) tablet Take 1 tablet by mouth daily.  . NON FORMULARY CPAP at bedtime.  Marland Kitchen omeprazole (PRILOSEC) 20 MG capsule Take 20 mg by mouth 2 (two) times daily  before a meal.  . Spacer/Aero-Holding Chambers DEVI 1 each by Does not apply route daily.  . [DISCONTINUED] albuterol (VENTOLIN HFA) 108 (90 Base) MCG/ACT inhaler Inhale 2 puffs into the lungs every 6 (six) hours as needed for wheezing or shortness of breath.  . [DISCONTINUED] doxycycline (MONODOX) 100 MG capsule Take 100 mg by mouth 2 (two) times daily.   Immunization History  Administered Date(s) Administered  . Fluad Quad(high Dose 65+) 09/15/2019  . Influenza Inj Mdck Quad Pf 10/01/2018  . Influenza Split 10/31/2015  . Influenza-Unspecified 10/20/2017, 10/02/2020  . Pneumococcal Conjugate-13 08/29/2016  . Pneumococcal Polysaccharide-23 03/22/2011, 10/20/2017  . Zoster Recombinat (Shingrix) 01/08/2019, 05/05/2019   Social History   Tobacco Use  . Smoking status: Former Smoker    Packs/day: 2.00    Years: 25.00    Pack years: 50.00    Types: Cigarettes    Quit date: 08/15/1993    Years since quitting: 27.2  . Smokeless tobacco: Never Used  Substance Use Topics  . Alcohol use: No       Objective:   Physical Exam BP 122/82 (BP Location: Left Arm, Cuff Size: Normal)   Pulse 77   Temp 97.7 F (36.5 C) (Temporal)   Ht 5\' 2"  (1.575 m)   Wt 228 lb 12.8 oz (103.8 kg)   SpO2 96%   BMI 41.85 kg/m   GENERAL: Obese woman, no acute respiratory distress, fully ambulatory.  No conversational dyspnea. HEAD: Normocephalic, atraumatic.  EYES: Pupils equal, round, reactive to light. No scleral icterus. Mild periorbital puffiness. MOUTH: Nose/mouth/throat not examined due to masking requirements for COVID 19. NECK: Supple. No thyromegaly. Trachea midline. No JVD. No adenopathy. PULMONARY: Good air entry bilaterally, coarse, no other adventitious sounds. CARDIOVASCULAR: S1 and S2. Regular rate and rhythm. Grade 1/6 to 2/6 systolic ejection murmur left sternal border.   Change from prior.   GASTROINTESTINAL: Obese, benign. MUSCULOSKELETAL: No joint deformity, no clubbing, no edema.   NEUROLOGIC: Fully ambulatory with no gait disturbance noted. Awake, alert, speech fluent, no overt focal deficits SKIN: Intact,warm,dry. On limited exam no rashes. PSYCH: Normal mood and behavior.      Assessment & Plan:     ICD-10-CM   1. Chronic cough  R05.3 Ambulatory referral to ENT   Will have the patient evaluated at a voice disorder center Will benefit from at least consultation Suspect multiple triggers  2. Asthma-COPD overlap syndrome (HCC)  J44.9    She appears well compensated on Trelegy Ellipta Continue Trelegy  3. Voice hoarseness  R49.0 Ambulatory referral to ENT   Not aggravated by Trelegy She rinses well after use Query ongoing issues with GERD  4. OSA (obstructive sleep apnea)  G47.33    On CPAP at 11 cm H2O She is compliant, corroborated with graph She feels marked improvement on CPAP  5. Morbid obesity with BMI of 40.0-44.9, adult (HCC)  E66.01    Z68.41    This issue adds complexity to her  management Weight loss recommended   Orders Placed This Encounter  Procedures  . Ambulatory referral to ENT    Referral Priority:   Routine    Referral Type:   Consultation    Referral Reason:   Specialty Services Required    Requested Specialty:   Otolaryngology    Number of Visits Requested:   1   Discussion:  Patient is compliant with CPAP for sleep apnea.  She is having some issues with the equipment and we will have her be evaluated by our sleep physician to discuss alternative management of OSA.  Reiterated weight loss.  She has had ongoing issues with cough that waxes and wanes.  She gets frustrated with this.  She has multiple potential issues aggravating this.  I think she would benefit from at least comprehensive evaluation at the voice disorder center and will be referred to Dr. Vergie Living at [facility].  Follow-up once her sleep clinic appointments and [facility] ENT appointments are completed.  She is to contact us prior to that time should any new difficulties  arise.   Gailen Shelter, MD [place]   *This note was dictated using voice recognition software/Dragon.  Despite best efforts to proofread, errors can occur which can change the meaning.  Any change was purely unintentional.

## 2020-10-25 NOTE — Telephone Encounter (Signed)
Spoke to patient via telephone. Patient stated that she forgot to mention during today's visit that insurance needs documentation that states she is wearing her cpap and benefiting from it.  Download has been printed and placed in Dr. Georgann Housekeeper folder for review.   Dr. Jayme Cloud

## 2020-10-25 NOTE — Patient Instructions (Signed)
We will refer you to the [facility], Dr. Lawrence Marseilles   We will have Dr. Coralyn Helling see you in the sleep clinic.  We will see him in follow-up once the above visits are done.

## 2020-10-26 ENCOUNTER — Encounter: Payer: Self-pay | Admitting: Pulmonary Disease

## 2020-11-22 ENCOUNTER — Other Ambulatory Visit: Payer: Self-pay | Admitting: Family

## 2020-11-22 NOTE — Telephone Encounter (Signed)
Rx request sent to pharmacy.  

## 2020-12-28 ENCOUNTER — Other Ambulatory Visit: Payer: Self-pay | Admitting: Pulmonary Disease

## 2021-01-04 ENCOUNTER — Other Ambulatory Visit: Payer: Self-pay

## 2021-01-04 ENCOUNTER — Telehealth: Payer: Self-pay

## 2021-01-04 ENCOUNTER — Encounter: Payer: Self-pay | Admitting: Pulmonary Disease

## 2021-01-04 ENCOUNTER — Ambulatory Visit (INDEPENDENT_AMBULATORY_CARE_PROVIDER_SITE_OTHER): Payer: Medicare Other | Admitting: Pulmonary Disease

## 2021-01-04 VITALS — BP 132/70 | HR 71 | Temp 97.8°F | Ht 62.0 in | Wt 232.2 lb

## 2021-01-04 DIAGNOSIS — R918 Other nonspecific abnormal finding of lung field: Secondary | ICD-10-CM

## 2021-01-04 DIAGNOSIS — G4733 Obstructive sleep apnea (adult) (pediatric): Secondary | ICD-10-CM | POA: Diagnosis not present

## 2021-01-04 DIAGNOSIS — J432 Centrilobular emphysema: Secondary | ICD-10-CM | POA: Diagnosis not present

## 2021-01-04 DIAGNOSIS — J4489 Other specified chronic obstructive pulmonary disease: Secondary | ICD-10-CM

## 2021-01-04 DIAGNOSIS — J449 Chronic obstructive pulmonary disease, unspecified: Secondary | ICD-10-CM | POA: Diagnosis not present

## 2021-01-04 NOTE — Telephone Encounter (Signed)
-----   Message from Salena Saner, MD sent at 01/04/2021 11:50 AM EST ----- She is actually due for follow-up with me, agree she needs follow-up.  The CT and did not feel that there was much change however, is a good idea to follow-up with CT without contrast.  Leeon Makar, put her for follow-up with me within 2 months and a CT scan without contrast at that time for lung nodules.  ----- Message ----- From: Coralyn Helling, MD Sent: 01/04/2021  11:05 AM EST To: Salena Saner, MD  I saw Rhonda Oconnell today for sleep apnea.  You see her for COPD with emphysema and asthma, and lung nodules.  It looks like her last CT chest was in August 2020 and mentioned some nodules were increasing in size.  I d/w Rhonda Oconnell today, and she asked if I could message you to review her CT and then determine if/when she needs follow up CT chest.  Thanks.  [place]

## 2021-01-04 NOTE — Progress Notes (Signed)
Pulmonary, Critical Care, and Sleep Medicine  Chief Complaint  Patient presents with  . Follow-up    Wearing cpap avg 7-8hr nightly-- feels pressure is too stong at times. ZSW:[id number].    Constitutional:  BP 132/70 (BP Location: Left Arm, Cuff Size: Normal)   Pulse 71   Temp 97.8 F (36.6 C) (Temporal)   Ht 5\' 2"  (1.575 m)   Wt 232 lb 3.2 oz (105.3 kg)   SpO2 96%   BMI 42.47 kg/m   Past Medical History:  Allergies, OA, GERD, HLD, HTN  Past Surgical History:  She  has a past surgical history that includes Arthroscopic rotator cuff repair; Bladder repair; Cholecystectomy; Hernia repair; Abdominal hysterectomy; Tubal ligation; Cataract extraction; Hemorrhoid surgery; Laparoscopic splenectomy; Colonoscopy with propofol (N/A, 07/22/2016); and Esophagogastroduodenoscopy (egd) with propofol (N/A, 07/22/2016).  Brief Summary:  Rhonda Oconnell is a 70 y.o. female former smoker with obstructive sleep apnea on CPAP, COPD with emphysema and asthma, and lung nodules.      Subjective:   She is followed by Dr. 73 for COPD and lung nodules.  She had sleep study in July 2021.  Found to have moderate sleep apnea.  Has been using CPAP 11 cm H2O.  Has full face mask.  Gets occasionally mask leak and then wakes up; this is happening less frequently.  No longer using supplemental oxygen at night.  Not having dry mouth, sore throat, sinus congestion, or aerophagia.  Feels rested during the day.  Not having cough, wheeze, sputum, or chest pain.  Hasn't needed to use albuterol much.  Physical Exam:   Appearance - well kempt   ENMT - no sinus tenderness, no oral exudate, no LAN, Mallampati 3 airway, no stridor, wears dentures  Respiratory - equal breath sounds bilaterally, no wheezing or rales  CV - s1s2 regular rate and rhythm, no murmurs  Ext - no clubbing, no edema  Skin - no rashes  Psych - normal mood and affect   Pulmonary testing:   Spirometry 11/24/18 >> 1.3  (57%), FEV1% 60  Chest Imaging:   CT chest 07/14/19 >> moderate to advanced emphysema, 6 mm nodule RUL, 5 mm nodule Rt lung base, 4 mm LLL nodule  Sleep Tests:   PSG 06/19/20 >> AHI 20.4, SpO2 low 81%  CPAP titration 07/18/20 >> CPAP 11 cm H2O  CPAP 12/03/20 to 01/02/20 >> used on 30 of 30 nights with average 8 hrs 37 min.  Average AHI 2.2 with CPAP 11 cm H2O  Cardiac Tests:   Echo 03/29/20 >> EF 55 to 60%, grade 2 DD  Social History:  She  reports that she quit smoking about 27 years ago. Her smoking use included cigarettes. She has a 50.00 pack-year smoking history. She has never used smokeless tobacco. She reports that she does not drink alcohol and does not use drugs.  Family History:  Her family history includes Aortic aneurysm in her brother; Atrial fibrillation in her brother and mother; Cancer in her father; Varicose Veins in her maternal grandmother and mother.     Assessment/Plan:   Obstructive sleep apnea. - she is compliant with CPAP and reports benefit from therapy - she uses [employer] for her DME - continue CPAP 11 cm H2O - reviewed alternative therapies for sleep apnea; explained she wouldn't be a candidate for Inspire device given her BMI is > 35  Obesity. - she is aware of how her weight can impact her health, particularly in relation to her sleep apnea  COPD with asthma and emphysema. - she will f/u with Dr. Danice Goltz - continue singulair, trelegy, prn ventolin  Lung nodules. - she would like to f/u with Dr. Danice Goltz to monitor these; will message Dr. Jayme Cloud to coordinate with patient to arrange for f/u CT chest if needed  Chronic diastolic CHF. - followed by Dr. Cristal Deer End with [facility]  Time Spent Involved in Patient Care on Day of Examination:  32 minutes  Follow up:  Patient Instructions  Follow up in 1 year  Medication List:   Allergies as of 01/04/2021      Reactions   Iodinated Diagnostic Agents Hives   Iodine Hives    Sulfa Antibiotics Hives      Medication List       Accurate as of January 04, 2021 10:59 AM. If you have any questions, ask your nurse or doctor.        acetaminophen 500 MG tablet Commonly known as: TYLENOL Take 500 mg by mouth every 6 (six) hours as needed.   albuterol 108 (90 Base) MCG/ACT inhaler Commonly known as: Ventolin HFA Inhale 2 puffs into the lungs every 6 (six) hours as needed for wheezing or shortness of breath.   aspirin EC 81 MG tablet Take 81 mg by mouth daily.   atorvastatin 20 MG tablet Commonly known as: LIPITOR Take 20 mg by mouth daily.   cephALEXin 500 MG capsule Commonly known as: KEFLEX Take 500 mg by mouth 4 (four) times daily. What changed: Another medication with the same name was removed. Continue taking this medication, and follow the directions you see here. Changed by: Coralyn Helling, MD   cetirizine 10 MG tablet Commonly known as: ZYRTEC Take 10 mg by mouth daily.   chlorpheniramine 4 MG tablet Commonly known as: CHLOR-TRIMETON Take 4 mg by mouth 2 (two) times daily as needed for allergies.   diphenhydramine-acetaminophen 25-500 MG Tabs tablet Commonly known as: TYLENOL PM Take 1 tablet by mouth at bedtime as needed.   doxycycline 100 MG capsule Commonly known as: VIBRAMYCIN Take 100 mg by mouth 2 (two) times daily.   fluticasone 50 MCG/ACT nasal spray Commonly known as: FLONASE Place 2 sprays into the nose daily.   furosemide 20 MG tablet Commonly known as: LASIX TAKE 1 TABLET BY MOUTH EVERY DAY   hydrochlorothiazide 12.5 MG capsule Commonly known as: MICROZIDE Take 12.5 mg by mouth daily.   losartan 100 MG tablet Commonly known as: COZAAR Take 100 mg by mouth daily.   montelukast 10 MG tablet Commonly known as: SINGULAIR Take 10 mg by mouth at bedtime.   multivitamin tablet Take 1 tablet by mouth daily.   NON FORMULARY CPAP at bedtime.   omeprazole 20 MG capsule Commonly known as: PRILOSEC Take 20 mg by  mouth 2 (two) times daily before a meal.   OSCAL 500/200 D-3 PO Take 1 tablet by mouth 2 (two) times daily.   Spacer/Aero-Holding Rudean Curt 1 each by Does not apply route daily.   Trelegy Ellipta 200-62.5-25 MCG/INH Aepb Generic drug: Fluticasone-Umeclidin-Vilant TAKE 1 PUFF BY MOUTH EVERY DAY       Signature:  Coralyn Helling, MD [facility] Pulmonary/Critical Care Pager - [phone] 01/04/2021, 10:59 AM

## 2021-01-04 NOTE — Patient Instructions (Signed)
Follow up in 1 year.

## 2021-01-04 NOTE — Telephone Encounter (Signed)
Patient is aware of below recommendations. She voiced her understanding and had no further questions.  CT has been ordered.  OV scheduled for 03/07/2021. Nothing further needed.

## 2021-01-24 ENCOUNTER — Encounter: Payer: Self-pay | Admitting: Internal Medicine

## 2021-01-24 ENCOUNTER — Ambulatory Visit (INDEPENDENT_AMBULATORY_CARE_PROVIDER_SITE_OTHER): Payer: Medicare Other | Admitting: Internal Medicine

## 2021-01-24 ENCOUNTER — Other Ambulatory Visit: Payer: Self-pay

## 2021-01-24 VITALS — BP 120/80 | HR 73 | Ht 62.0 in | Wt 231.1 lb

## 2021-01-24 DIAGNOSIS — I10 Essential (primary) hypertension: Secondary | ICD-10-CM

## 2021-01-24 DIAGNOSIS — I5032 Chronic diastolic (congestive) heart failure: Secondary | ICD-10-CM

## 2021-01-24 NOTE — Patient Instructions (Signed)

## 2021-01-24 NOTE — Progress Notes (Signed)
Follow-up Outpatient Visit Date: 01/24/2021  Primary Care Provider: Kandyce Rud, MD [phone] [address] [facility] [place] [address]  Chief Complaint: Follow-up HFpEF  HPI:  Rhonda Oconnell is a 70 y.o. female with history of chronic HFpEF, obstructive lung disease (COPD/asthma overlap syndrome complicated by recurrent sinusitis), hypertension, hyperlipidemia, GERD, anemia, and morbid obesity, who presents for follow-up of HFpEF.  She was last seen in our office in 09/2020 by Rhonda Shields, NP.  At that time, she reported improving cough and some decline in her weight.  She was using furosemide about once a week.  She has been using CPAP regularly without issues.  Today, [name] reports that she has been doing relatively well though she has "good days and bad days" in regard to her breathing.  Her dyspnea is sometimes accompanied by wheezing and seems to be worsened when she has sinus congestion.  She has mild leg edema, which she attributes to a recent car trip.  She also scraped her right calf getting out of her vehicle and has been started on a course of antibiotics for cellulitis.  She denies chest pain, palpitations, lightheadedness, and orthopnea.  She is tolerating CPAP well and feels like it has helped her.  --------------------------------------------------------------------------------------------------  Cardiovascular History & Procedures: Cardiovascular Problems:  Diastolic dysfunction and shortness of breath  Risk Factors:  Hypertension, hyperlipidemia, morbid obesity, prior tobacco use, and age greater than 59  Cath/PCI:  None  CV Surgery:  None  EP Procedures and Devices:  None  Non-Invasive Evaluation(s):  TTE (03/29/2020): Technically difficult study.  LVEF 55-60% with grade 2 diastolic dysfunction.  Normal RV size and function.  Grossly normal mitral valve.  Aortic sclerosis.   Recent CV Pertinent  Labs: Lab Results  Component Value Date   K 4.0 06/20/2020   BUN 15 06/20/2020   CREATININE 0.82 06/20/2020    Past medical and surgical history were reviewed and updated in EPIC.  Current Meds  Medication Sig  . acetaminophen (TYLENOL) 500 MG tablet Take 500 mg by mouth every 6 (six) hours as needed.  Marland Kitchen albuterol (VENTOLIN HFA) 108 (90 Base) MCG/ACT inhaler Inhale 2 puffs into the lungs every 6 (six) hours as needed for wheezing or shortness of breath.  Marland Kitchen amoxicillin-clavulanate (AUGMENTIN) 875-125 MG tablet Take 1 tablet by mouth 2 (two) times daily.  Marland Kitchen aspirin EC 81 MG tablet Take 81 mg by mouth daily.  Marland Kitchen atorvastatin (LIPITOR) 20 MG tablet Take 20 mg by mouth daily.  . Calcium Carbonate-Vitamin D (OSCAL 500/200 D-3 PO) Take 1 tablet by mouth 2 (two) times daily.  . cetirizine (ZYRTEC) 10 MG tablet Take 10 mg by mouth daily.   . chlorpheniramine (CHLOR-TRIMETON) 4 MG tablet Take 4 mg by mouth 2 (two) times daily as needed for allergies.  . diphenhydramine-acetaminophen (TYLENOL PM) 25-500 MG TABS tablet Take 1 tablet by mouth at bedtime as needed.  . fluticasone (FLONASE) 50 MCG/ACT nasal spray Place 2 sprays into the nose daily.   . furosemide (LASIX) 20 MG tablet TAKE 1 TABLET BY MOUTH EVERY DAY  . hydrochlorothiazide (MICROZIDE) 12.5 MG capsule Take 12.5 mg by mouth daily.  Marland Kitchen loratadine (CLARITIN) 10 MG tablet Take by mouth.  . losartan (COZAAR) 100 MG tablet Take 100 mg by mouth daily.  . montelukast (SINGULAIR) 10 MG tablet Take 10 mg by mouth at bedtime.  . Multiple Vitamin (MULTIVITAMIN) tablet Take 1 tablet by mouth daily.  . mupirocin ointment (  BACTROBAN) 2 % Apply topically 3 (three) times daily.  . NON FORMULARY CPAP at bedtime.  Marland Kitchen omeprazole (PRILOSEC) 20 MG capsule Take 20 mg by mouth 2 (two) times daily before a meal.  . Spacer/Aero-Holding Chambers DEVI 1 each by Does not apply route daily.  . TRELEGY ELLIPTA 200-62.5-25 MCG/INH AEPB TAKE 1 PUFF BY MOUTH EVERY DAY     Allergies: Iodinated diagnostic agents, Sulfa antibiotics, and Iodine  Social History   Tobacco Use  . Smoking status: Former Smoker    Packs/day: 2.00    Years: 25.00    Pack years: 50.00    Types: Cigarettes    Quit date: 08/15/1993    Years since quitting: 27.4  . Smokeless tobacco: Never Used  Vaping Use  . Vaping Use: Never used  Substance Use Topics  . Alcohol use: No  . Drug use: No    Family History  Problem Relation Age of Onset  . Varicose Veins Mother   . Atrial fibrillation Mother   . Cancer Father   . Varicose Veins Maternal Grandmother   . Atrial fibrillation Brother   . Aortic aneurysm Brother     Review of Systems: A 12-system review of systems was performed and was negative except as noted in the HPI.  --------------------------------------------------------------------------------------------------  Physical Exam: BP 120/80 (BP Location: Left Arm, Patient Position: Sitting, Cuff Size: Normal)   Pulse 73   Ht 5\' 2"  (1.575 m)   Wt 231 lb 2 oz (104.8 kg)   SpO2 97%   BMI 42.27 kg/m   General:  NAD. Neck: No JVD or HJR, though body habitus limits evaluation. Lungs: Clear to auscultation bilaterally without wheezes or crackles. Heart: Regular rate and rhythm without murmurs, rubs, or gallops. Abdomen: Soft, nontender, nondistended. Extremities: Trace pretibial edema.  Erythema in right medial calf without drainage.  EKG: Normal sinus rhythm without abnormality.   Lab Results  Component Value Date   NA 137 06/20/2020   K 4.0 06/20/2020   CL 98 06/20/2020   CO2 27 06/20/2020   BUN 15 06/20/2020   CREATININE 0.82 06/20/2020   GLUCOSE 100 (H) 06/20/2020    --------------------------------------------------------------------------------------------------  ASSESSMENT AND PLAN: Chronic HFpEF: Other than trace pretibial edema, Ms. Circle appears euvolemic with stable NYHA class II symptoms.  Underlying lung disease and morbid obesity  likely playing a large role in her intermittent dyspnea.  We will continue her current medications, though I have advised her to increase her torsemide if she has worsening leg edema or weight gain until she is back to baseline.  Hypertension: Blood pressure well controlled today.  No medication changes.  Morbid obesity: BMI remains greater than 40.  I have encouraged Ms. Siegmann to work on weight loss through diet and exercise, though she notes chronic knee pain makes this difficult.  Follow-up: Return to clinic in 6 months.  06/22/2020, MD 01/24/2021 11:30 AM

## 2021-02-26 ENCOUNTER — Other Ambulatory Visit: Payer: Self-pay

## 2021-02-26 ENCOUNTER — Ambulatory Visit
Admission: RE | Admit: 2021-02-26 | Discharge: 2021-02-26 | Disposition: A | Discharge status: Home / Self Care | Payer: Medicare Other | Source: Ambulatory Visit | Attending: Pulmonary Disease | Admitting: Pulmonary Disease

## 2021-02-26 DIAGNOSIS — R918 Other nonspecific abnormal finding of lung field: Secondary | ICD-10-CM | POA: Diagnosis present

## 2021-03-07 ENCOUNTER — Encounter: Payer: Self-pay | Admitting: Pulmonary Disease

## 2021-03-07 ENCOUNTER — Other Ambulatory Visit: Payer: Self-pay

## 2021-03-07 ENCOUNTER — Ambulatory Visit (INDEPENDENT_AMBULATORY_CARE_PROVIDER_SITE_OTHER): Payer: Medicare Other | Admitting: Pulmonary Disease

## 2021-03-07 VITALS — BP 140/80 | HR 77 | Temp 97.0°F | Ht 62.0 in | Wt 232.4 lb

## 2021-03-07 DIAGNOSIS — R918 Other nonspecific abnormal finding of lung field: Secondary | ICD-10-CM

## 2021-03-07 DIAGNOSIS — R053 Chronic cough: Secondary | ICD-10-CM | POA: Diagnosis not present

## 2021-03-07 DIAGNOSIS — J449 Chronic obstructive pulmonary disease, unspecified: Secondary | ICD-10-CM

## 2021-03-07 MED ORDER — FLUTICASONE PROPIONATE 50 MCG/ACT NA SUSP
2.0000 | Freq: Every day | NASAL | 3 refills | Status: DC
Start: 2021-03-07 — End: 2022-03-21

## 2021-03-07 MED ORDER — CETIRIZINE HCL 10 MG PO TABS: 10.0000 mg | ORAL_TABLET | Freq: Every day | ORAL | 3 refills | Status: AC

## 2021-03-07 MED ORDER — BREZTRI AEROSPHERE 160-9-4.8 MCG/ACT IN AERO: 160.0000 ug | INHALATION_SPRAY | Freq: Two times a day (BID) | RESPIRATORY_TRACT | 0 refills | Status: DC

## 2021-03-07 NOTE — Progress Notes (Signed)
Subjective:    Patient ID: Rhonda Oconnell, female    DOB: [birth year], 70 y.o.   MRN: [id number] Chief Complaint  Patient presents with   Follow-up    COPD and Asthma  breathing sometimes good and sometimes bad SOB when walking . Tickle in throat frequently. Occasional wheezing.    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  She has issues with chronic recurrent sinusitis and issues with cyclical cough.  She has sleep apnea and is on CPAP at 11 cm H2O. She has been compliant with her CPAP, her compliance graph looks excellent.  She continues to have issues with dry cough.  This waxes and wanes.  She cannot find any particular triggering issue except some perfumes and cold air will sometimes trigger it. She has gastroesophageal reflux but feels that this is well controlled with Prilosec.  She is on Trelegy for her asthma/COPD overlap and feels that this controls her dyspnea well but does not control her cough completely.  She also notes occasional hoarseness.  Her cough is multifactorial and she as she has multiple triggers.  She does note that she has constant "tickle in her throat".  She still notes occasional wheezing. She has not had any fevers, chills or sweats.  No sputum production per se.  No hemoptysis.  No chest pain, no orthopnea or paroxysmal nocturnal dyspnea.  Her previous nocturnal awakenings have resolved with the use of CPAP.  She was evaluated by Dr. 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  This is going to be followed expectantly.  Appears benign.  She was also offered voice training however she declined this.  Otherwise he is doing agreement with her current management.  She had a CT scan of the chest yesterday which showed stable appearance of her bilateral pulmonary nodules these are consistent with a benign process.  She has a stable right adrenal  adenoma.  Emphysema and coronary artery calcifications noted as well.  Overall no change from prior.  She is followed by cardiology with regards to her cardiac issues.   She voices no other complaints she otherwise feels well and looks well.    Review of Systems A 10 point review of systems was performed and it is as noted above otherwise negative.  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 2 packs a day at most   Substance Use Topics   Alcohol use: No    Allergies  Allergen Reactions   Iodinated Diagnostic Agents Hives   Sulfa Antibiotics Hives   Iodine Hives    Current Meds  Medication Sig   acetaminophen (TYLENOL) 500 MG tablet Take 500 mg by mouth every 6 (six) hours as needed.   albuterol (VENTOLIN HFA) 108 (90 Base) MCG/ACT inhaler Inhale 2 puffs into the lungs every 6 (six) hours as needed for wheezing or shortness of breath.   aspirin EC 81 MG tablet Take 81 mg by mouth daily.   atorvastatin (LIPITOR) 20 MG tablet Take 20 mg by mouth daily.   Calcium Carbonate-Vitamin D (OSCAL 500/200 D-3 PO) Take 1 tablet by mouth  2 (two) times daily.   cetirizine (ZYRTEC) 10 MG tablet Take 10 mg by mouth daily.    chlorpheniramine (CHLOR-TRIMETON) 4 MG tablet Take 4 mg by mouth 2 (two) times daily as needed for allergies.   diphenhydramine-acetaminophen (TYLENOL PM) 25-500 MG TABS tablet Take 1 tablet by mouth at bedtime as needed.   fluticasone (FLONASE) 50 MCG/ACT nasal spray Place 2 sprays into the nose daily.    furosemide (LASIX) 20 MG tablet TAKE 1 TABLET BY MOUTH EVERY DAY   hydrochlorothiazide (HYDRODIURIL) 12.5 MG tablet Take 1 tablet by mouth daily.   loratadine (CLARITIN) 10 MG tablet Take by mouth.   losartan (COZAAR) 100 MG tablet Take 100 mg by mouth daily.   montelukast (SINGULAIR) 10 MG tablet Take 10 mg by mouth at bedtime.   Multiple Vitamin (MULTIVITAMIN) tablet Take 1 tablet by mouth daily.   NON FORMULARY CPAP at bedtime.   omeprazole (PRILOSEC) 20 MG capsule Take 20 mg by mouth 2 (two) times daily before a meal.   Spacer/Aero-Holding Chambers DEVI 1 each by Does not apply route daily.   TRELEGY ELLIPTA 200-62.5-25 MCG/INH AEPB TAKE 1 PUFF BY MOUTH EVERY DAY   Immunization History  Administered Date(s) Administered   Fluad Quad(high Dose 65+) 09/15/2019   Influenza Inj Mdck Quad Pf 10/01/2018   Influenza Split 10/31/2015   Influenza-Unspecified 10/20/2017, 10/02/2020   Pneumococcal Conjugate-13 08/29/2016   Pneumococcal Polysaccharide-23 03/22/2011, 10/20/2017   Zoster Recombinat (Shingrix) 01/08/2019, 05/05/2019       Objective:   Physical Exam BP 140/80 (BP Location: Left Arm, Patient Position: Sitting, Cuff Size: Large)   Pulse 77   Temp (!) 97 F (36.1 C) (Temporal)   Ht 5\' 2"  (1.575 m)   Wt 232 lb 6.4 oz (105.4 kg)   SpO2 97%   BMI 42.51 kg/m  GENERAL: Obese woman, no acute respiratory distress, fully ambulatory.  No conversational dyspnea.  Almost constant throat clearing. HEAD: Normocephalic, atraumatic. EYES: Pupils equal, round, reactive to light.  No scleral icterus.   Mild periorbital puffiness. MOUTH: Nose/mouth/throat not examined due to masking requirements for COVID 19. NECK: Supple. No thyromegaly. Trachea midline. No JVD.  No adenopathy. PULMONARY: Good air entry bilaterally, coarse, no other adventitious sounds. CARDIOVASCULAR: S1 and S2. Regular rate and rhythm.  Grade 1/6 to 2/6 systolic ejection murmur left sternal border.    Change from prior.   GASTROINTESTINAL: Obese, benign. MUSCULOSKELETAL: No joint deformity, no clubbing, no edema. NEUROLOGIC: Fully ambulatory with no gait disturbance noted.  Awake, alert, speech fluent, no overt focal deficits SKIN: Intact,warm,dry.  On limited exam no rashes. PSYCH: Normal mood and behavior.     Assessment & Plan:     ICD-10-CM   1. Asthma-COPD overlap syndrome (HCC)  J44.9 DISCONTINUED: Budeson-Glycopyrrol-Formoterol (BREZTRI AEROSPHERE) 160-9-4.8 MCG/ACT AERO   We will give her a trial of  Breztri 2 puffs twice a day Hold off on Trelegy while on [place] Let us know how this works for her    2. Chronic cough  R05.3    Multifactorial Reinstructed on antireflux measures Reinstructed on nasal hygiene    3. Lung nodules  R91.8    Stable, benign process     Meds ordered this encounter  Medications   Budeson-Glycopyrrol-Formoterol (BREZTRI AEROSPHERE) 160-9-4.8 MCG/ACT AERO    Sig: Inhale 160 mcg into the lungs in the morning and at bedtime.    Dispense:  5.9 g    Refill:  0    Order Specific Question:   Lot Number?    Answer:   [id number]    Order Specific Question:   Expiration Date?    Answer:   08/07/2022    Order Specific Question:   Quantity    Answer:   2    Comments:   SAMPLES   cetirizine (ZYRTEC) 10 MG tablet    Sig: Take 1 tablet (10 mg total) by mouth daily.    Dispense:  90 tablet    Refill:  3   fluticasone (FLONASE) 50 MCG/ACT nasal spray    Sig: Place 2 sprays into both nostrils daily.    Dispense:  32 g    Refill:  3   Patient continues to have issues with cough.  We  will give her a trial of Breztri 2 puffs twice a day to see if this is less irritating to her upper airway.  She also requested prescriptions of Zyrtec and Flonase and these were given to her, it appears that she can get these covered even though they are OTC.  He is to let us know how she is doing with the [facility].  We will see her in follow-up in 2 months time she is to contact us prior to that time should any new difficulties arise.   Gailen Shelter, MD [facility]   *This note was dictated using voice recognition software/Dragon.  Despite best efforts to proofread, errors can occur which can change the meaning.  Any change was purely unintentional.

## 2021-03-07 NOTE — Patient Instructions (Signed)
We are giving you a trial of Breztri 2 puffs twice a day.  Let us know how you do with this so we can send the prescription in for you.  Make sure you rinse your mouth well after you use it.  DO NOT USE TRELEGY WHILE USING THE BREZTRI.  We will see you in follow-up in 2 months time call sooner should any new problems arise.   We have printed prescriptions for Zyrtec and Flonase as requested.

## 2021-03-20 DIAGNOSIS — J449 Chronic obstructive pulmonary disease, unspecified: Secondary | ICD-10-CM

## 2021-03-20 NOTE — Telephone Encounter (Signed)
Okay to finish her Trelegy before switching to [employer].

## 2021-03-20 NOTE — Telephone Encounter (Signed)
Dr. Gonzalez, please advise.  

## 2021-04-04 MED ORDER — BREZTRI AEROSPHERE 160-9-4.8 MCG/ACT IN AERO: 160.0000 ug | INHALATION_SPRAY | Freq: Two times a day (BID) | RESPIRATORY_TRACT | 11 refills | Status: DC

## 2021-04-09 MED ORDER — BREZTRI AEROSPHERE 160-9-4.8 MCG/ACT IN AERO: 2.0000 | INHALATION_SPRAY | Freq: Two times a day (BID) | RESPIRATORY_TRACT | 11 refills | Status: DC

## 2021-04-19 NOTE — Telephone Encounter (Signed)
We can try PAXLOVID 150 mg /100 mg convenience pack, 1 dose as described in package, twice a day.  She can also take Pepcid 20 mg twice a day and Zyrtec 10 mg daily that should help with the symptoms but there is really nothing much more.  She needs to push fluids and rest.  If she notes increasing shortness of breath she needs to be evaluated in the emergency room.

## 2021-04-19 NOTE — Telephone Encounter (Signed)
Dr. Gonzalez, please advise. Thanks 

## 2021-05-15 ENCOUNTER — Encounter: Payer: Self-pay | Admitting: Pulmonary Disease

## 2021-05-16 ENCOUNTER — Other Ambulatory Visit: Payer: Self-pay

## 2021-05-16 ENCOUNTER — Encounter: Payer: Self-pay | Admitting: Pulmonary Disease

## 2021-05-16 ENCOUNTER — Ambulatory Visit (INDEPENDENT_AMBULATORY_CARE_PROVIDER_SITE_OTHER): Payer: Medicare Other | Admitting: Pulmonary Disease

## 2021-05-16 VITALS — BP 134/80 | HR 75 | Temp 97.1°F | Ht 62.0 in | Wt 232.0 lb

## 2021-05-16 DIAGNOSIS — R918 Other nonspecific abnormal finding of lung field: Secondary | ICD-10-CM

## 2021-05-16 DIAGNOSIS — Z9989 Dependence on other enabling machines and devices: Secondary | ICD-10-CM

## 2021-05-16 DIAGNOSIS — G4733 Obstructive sleep apnea (adult) (pediatric): Secondary | ICD-10-CM | POA: Diagnosis not present

## 2021-05-16 DIAGNOSIS — Z6841 Body Mass Index (BMI) 40.0 and over, adult: Secondary | ICD-10-CM

## 2021-05-16 DIAGNOSIS — J449 Chronic obstructive pulmonary disease, unspecified: Secondary | ICD-10-CM | POA: Diagnosis not present

## 2021-05-16 DIAGNOSIS — Z8616 Personal history of COVID-19: Secondary | ICD-10-CM

## 2021-05-16 NOTE — Progress Notes (Signed)
Subjective:    Patient ID: Rhonda Oconnell, female    DOB: 1951/09/28, 70 y.o.   MRN: [id number] Chief Complaint  Patient presents with   Follow-up    Recent covid 04/2021- C/o sob with exertion, voice hoariness,prod cough with brownish sputum and wheezing.    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  This is a scheduled visit.  She was last seen on 03/07/2021 she has issues with chronic recurrent sinusitis and issues with cyclical cough.  She has sleep apnea and is on CPAP at 11 cm H2O. She has been compliant with her CPAP, and today her compliance report looks excellent.  Recall that at her last visit she was placed on Breztri 2 puffs twice a day.  She notes that this is helping her and it has decreased her cough significantly.  She does need a new spacer as her spacer is a few years old.  She had COVID in May and was treated with Paxlovid she did not require hospitalization did relatively well.  Her dyspnea is at baseline.  Cough and sputum production unchanged.  No hemoptysis.  No chest pain.  No calf tenderness.  Overall she feels well and looks well.   Review of Systems A 10 point review of systems was performed and it is as noted above otherwise negative.  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 2 packs a day at most   Substance Use Topics   Alcohol use: No   Allergies  Allergen Reactions   Iodinated Diagnostic Agents Hives   Sulfa Antibiotics Hives   Iodine Hives   Current Meds  Medication Sig   acetaminophen (TYLENOL) 500 MG tablet Take 500 mg by mouth every 6 (six) hours as needed.   albuterol (VENTOLIN HFA) 108 (90 Base) MCG/ACT inhaler Inhale 2 puffs into the lungs every 6 (six) hours as needed for wheezing or shortness of breath.   aspirin EC 81 MG tablet Take 81 mg by mouth daily.   atorvastatin (LIPITOR) 20 MG tablet Take 20 mg by mouth daily.   Budeson-Glycopyrrol-Formoterol (BREZTRI AEROSPHERE) 160-9-4.8 MCG/ACT AERO Inhale 2 puffs into the lungs in the morning and at bedtime.   Calcium Carbonate-Vitamin D (OSCAL 500/200 D-3 PO) Take 1 tablet by mouth 2 (two) times daily.   cetirizine (ZYRTEC) 10 MG tablet Take 1 tablet (10 mg total) by mouth daily.   chlorpheniramine (CHLOR-TRIMETON) 4 MG tablet Take 4 mg by mouth 2 (two) times daily as needed for allergies.  diphenhydramine-acetaminophen (TYLENOL PM) 25-500 MG TABS tablet Take 1 tablet by mouth at bedtime as needed.   fluticasone (FLONASE) 50 MCG/ACT nasal spray Place 2 sprays into both nostrils daily.   furosemide (LASIX) 20 MG tablet TAKE 1 TABLET BY MOUTH EVERY DAY (Patient taking differently: daily as needed.)   hydrochlorothiazide (HYDRODIURIL) 12.5 MG tablet Take 1 tablet by mouth daily.   losartan (COZAAR) 100 MG tablet Take 100 mg by mouth daily.   montelukast (SINGULAIR) 10 MG tablet Take 10 mg by mouth at bedtime.   Multiple Vitamin (MULTIVITAMIN) tablet Take 1 tablet by mouth daily.   NON FORMULARY CPAP at bedtime.   omeprazole  (PRILOSEC) 20 MG capsule Take 20 mg by mouth 2 (two) times daily before a meal.   Spacer/Aero-Holding Chambers DEVI 1 each by Does not apply route daily.   [DISCONTINUED] hydrochlorothiazide (MICROZIDE) 12.5 MG capsule Take 12.5 mg by mouth daily.   [DISCONTINUED] mupirocin ointment (BACTROBAN) 2 % Apply topically 3 (three) times daily.   Immunization History  Administered Date(s) Administered   Fluad Quad(high Dose 65+) 09/15/2019   Influenza Inj Mdck Quad Pf 10/01/2018   Influenza Split 10/31/2015   Influenza-Unspecified 10/20/2017, 10/02/2020   Pneumococcal Conjugate-13 08/29/2016   Pneumococcal Polysaccharide-23 03/22/2011, 10/20/2017   Zoster Recombinat (Shingrix) 01/08/2019, 05/05/2019      Objective:   Physical Exam BP 134/80 (BP Location: Left Arm, Cuff Size: Normal)   Pulse 75   Temp (!) 97.1 F (36.2 C) (Temporal)   Ht 5\' 2"  (1.575 m)   Wt 232 lb (105.2 kg)   SpO2 98%   BMI 42.43 kg/m  GENERAL: Obese woman, no acute respiratory distress, fully ambulatory.  No conversational dyspnea.  Almost constant throat clearing. HEAD: Normocephalic, atraumatic. EYES: Pupils equal, round, reactive to light.  No scleral icterus.  Mild periorbital puffiness. MOUTH: Nose/mouth/throat not examined due to masking requirements for COVID 19. NECK: Supple. No thyromegaly. Trachea midline. No JVD.  No adenopathy. PULMONARY: Good air entry bilaterally, coarse, no other adventitious sounds. CARDIOVASCULAR: S1 and S2. Regular rate and rhythm.  Grade 1/6 to 2/6 systolic ejection murmur left sternal border. No change from prior.   GASTROINTESTINAL: Obese, benign. MUSCULOSKELETAL: No joint deformity, no clubbing, no edema. NEUROLOGIC: Fully ambulatory with no gait disturbance noted.  Awake, alert, speech fluent, no overt focal deficits SKIN: Intact,warm,dry.  On limited exam no rashes. PSYCH: Normal mood and behavior.    Assessment & Plan:     ICD-10-CM   1. Asthma-COPD overlap syndrome (HCC)   J44.9    Continue Breztri 2 puffs twice a day New inhaler provided for the patient    2. Lung nodules  R91.8    Stable, benign etiology    3. OSA on CPAP  G47.33    Z99.89    Continue CPAP 11 cm water pressure.    4. Morbid obesity with BMI of 40.0-44.9, adult (HCC)  E66.01    Z68.41    His issue adds complexity to her management Recommend weight loss    5. Personal history of COVID-19  Z86.16    No overt sequela      Patient is overall doing well.  Continue same management.  Follow-up in 4 months time call sooner should any new problems arise.  09-21-1982, MD [facility]   *This note was dictated using voice recognition software/Dragon.  Despite best efforts to proofread, errors can occur which can change the meaning.  Any change was purely unintentional.

## 2021-05-16 NOTE — Patient Instructions (Signed)
We provided you with a new spacer.  You may use a spacer with Breztri and albuterol.  Continue using your CPAP, your compliance is good.  We will see you in follow-up in 4 months time call sooner should any problems arise.

## 2021-06-21 ENCOUNTER — Encounter: Payer: Self-pay | Admitting: Pulmonary Disease

## 2021-07-25 ENCOUNTER — Encounter: Payer: Self-pay | Admitting: Internal Medicine

## 2021-07-25 ENCOUNTER — Ambulatory Visit (INDEPENDENT_AMBULATORY_CARE_PROVIDER_SITE_OTHER): Payer: Medicare Other | Admitting: Internal Medicine

## 2021-07-25 ENCOUNTER — Other Ambulatory Visit: Payer: Self-pay

## 2021-07-25 VITALS — BP 144/80 | HR 77 | Ht 62.0 in | Wt 233.5 lb

## 2021-07-25 DIAGNOSIS — I10 Essential (primary) hypertension: Secondary | ICD-10-CM

## 2021-07-25 DIAGNOSIS — J449 Chronic obstructive pulmonary disease, unspecified: Secondary | ICD-10-CM | POA: Diagnosis not present

## 2021-07-25 DIAGNOSIS — I5032 Chronic diastolic (congestive) heart failure: Secondary | ICD-10-CM

## 2021-07-25 NOTE — Progress Notes (Signed)
Follow-up Outpatient Visit Date: 07/25/2021  Primary Care Provider: Kandyce Rud, MD [phone] [facility] [place] [address]  Chief Complaint: Cough  HPI:  Ms. Bitton is a 70 y.o. female with history of chronic HFpEF, obstructive lung disease (COPD/asthma overlap syndrome complicated by recurrent sinusitis), hypertension, hyperlipidemia, GERD, anemia, and morbid obesity, who presents for follow-up of HFpEF.  I last saw her in February, at which time she was doing fairly well though she reported waxing and waning shortness of breath.  We did not make any medication changes or pursue additional testing.  Today, Ms. Paull reports that her chronic exertional dyspnea is stable from prior visits.  She seems to be coughing more.  It is nonproductive.  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  3 to 44-month follow-up was recommended (she has not returned).  She has not had any chest pain or orthopnea.  She notes edema only when at the beach, which prompts her to use her as needed furosemide.  She is trying to exercise some.  She notes that her home blood pressures are usually around 130/80.  --------------------------------------------------------------------------------------------------  Cardiovascular History & Procedures: Cardiovascular Problems: Diastolic dysfunction and shortness of breath   Risk Factors: Hypertension, hyperlipidemia, morbid obesity, prior tobacco use, and age greater than 76   Cath/PCI: None   CV Surgery: None   EP Procedures and Devices: None   Non-Invasive Evaluation(s): Pharmacologic MPI (05/15/2020): Low risk study without ischemia or scar, though study was limited by GI uptake.  LVEF 55-65%. TTE (03/29/2020): Technically difficult study.  LVEF 55-60% with grade 2 diastolic dysfunction.  Normal RV size and function.  Grossly normal mitral valve.  Aortic  sclerosis.  Recent CV Pertinent Labs: Lab Results  Component Value Date   K 4.0 06/20/2020   BUN 15 06/20/2020   CREATININE 0.82 06/20/2020    Past medical and surgical history were reviewed and updated in EPIC.  Current Meds  Medication Sig   acetaminophen (TYLENOL) 500 MG tablet Take 500 mg by mouth every 6 (six) hours as needed.   albuterol (VENTOLIN HFA) 108 (90 Base) MCG/ACT inhaler Inhale 2 puffs into the lungs every 6 (six) hours as needed for wheezing or shortness of breath.   aspirin EC 81 MG tablet Take 81 mg by mouth daily.   atorvastatin (LIPITOR) 20 MG tablet Take 20 mg by mouth daily.   Budeson-Glycopyrrol-Formoterol (BREZTRI AEROSPHERE) 160-9-4.8 MCG/ACT AERO Inhale 2 puffs into the lungs in the morning and at bedtime.   Calcium Carbonate-Vitamin D (OSCAL 500/200 D-3 PO) Take 1 tablet by mouth 2 (two) times daily.   cetirizine (ZYRTEC) 10 MG tablet Take 1 tablet (10 mg total) by mouth daily.   chlorpheniramine (CHLOR-TRIMETON) 4 MG tablet Take 4 mg by mouth 2 (two) times daily as needed for allergies.   diphenhydramine-acetaminophen (TYLENOL PM) 25-500 MG TABS tablet Take 1 tablet by mouth at bedtime as needed.   fluticasone (FLONASE) 50 MCG/ACT nasal spray Place 2 sprays into both nostrils daily.   furosemide (LASIX) 20 MG tablet TAKE 1 TABLET BY MOUTH EVERY DAY (Patient taking differently: daily as needed.)   hydrochlorothiazide (HYDRODIURIL) 12.5 MG tablet Take 1 tablet by mouth daily.   losartan (COZAAR) 100 MG tablet Take 100 mg by mouth daily.   montelukast (SINGULAIR) 10 MG tablet Take 10 mg by mouth at bedtime.   Multiple Vitamin (MULTIVITAMIN) tablet Take  1 tablet by mouth daily.   NON FORMULARY CPAP at bedtime.   omeprazole (PRILOSEC) 20 MG capsule Take 20 mg by mouth 2 (two) times daily before a meal.   Spacer/Aero-Holding Chambers DEVI 1 each by Does not apply route daily.    Allergies: Iodinated diagnostic agents, Sulfa antibiotics, and Iodine  Social  History   Tobacco Use   Smoking status: Former    Packs/day: 2.00    Years: 25.00    Pack years: 50.00    Types: Cigarettes    Quit date: 08/15/1993    Years since quitting: 27.9   Smokeless tobacco: Never   Tobacco comments:    Quit smoking 25 years ago. 2 packs a day at most   Vaping Use   Vaping Use: Never used  Substance Use Topics   Alcohol use: No   Drug use: No    Family History  Problem Relation Age of Onset   Varicose Veins Mother    Atrial fibrillation Mother    Cancer Father    Varicose Veins Maternal Grandmother    Atrial fibrillation Brother    Aortic aneurysm Brother     Review of Systems: A 12-system review of systems was performed and was negative except as noted in the HPI.  --------------------------------------------------------------------------------------------------  Physical Exam: BP (!) 144/80 (BP Location: Left Arm, Patient Position: Sitting, Cuff Size: Large)   Pulse 77   Ht 5\' 2"  (1.575 m)   Wt 233 lb 8 oz (105.9 kg)   SpO2 96%   BMI 42.71 kg/m   General:  NAD. Neck: No gross JVD, though body habitus limits evaluation. Lungs: Clear to auscultation bilaterally without wheezes or crackles. Heart: Regular rate and rhythm without murmurs, rubs, or gallops. Abdomen: Soft, nontender, nondistended. Extremities: No lower extremity edema.  EKG: Normal sinus rhythm without abnormality.  No results found for: WBC, HGB, HCT, MCV, PLT  Lab Results  Component Value Date   NA 137 06/20/2020   K 4.0 06/20/2020   CL 98 06/20/2020   CO2 27 06/20/2020   BUN 15 06/20/2020   CREATININE 0.82 06/20/2020   GLUCOSE 100 (H) 06/20/2020    No results found for: CHOL, HDL, LDLCALC, LDLDIRECT, TRIG, CHOLHDL  --------------------------------------------------------------------------------------------------  ASSESSMENT AND PLAN: Chronic HFpEF, obstructive lung disease, shortness of breath, and cough: Cough seems to have worsened over the last several  months though chronic shortness of breath with activity is stable.  I suspect this is multifactorial.  We will defer medication changes today.  I have encouraged Ms. Macho to continue exercising in an attempt to lose weight and improve her stamina.  She should continue to follow-up with Dr. 06/22/2020.  I have also suggested that she reach out to [facility] ENT to schedule a follow-up as recommended at her visit in January.  Hypertension: Blood pressure mildly elevated today but typically better at home.  Defer medication changes today.  Lifestyle modifications encouraged.  Morbid obesity: BMI greater than 40.  I congratulated Ms. Bolda on exercising more and encouraged her to keep working on weight loss through diet and exercise.  Follow-up: Return to clinic in 6 months.  February, MD 07/25/2021 1:37 PM

## 2021-07-25 NOTE — Patient Instructions (Signed)
Medication Instructions:   Your physician recommends that you continue on your current medications as directed. Please refer to the Current Medication list given to you today.  *If you need a refill on your cardiac medications before your next appointment, please call your pharmacy*   Lab Work:  None ordered  Testing/Procedures:  None ordered   Follow-Up: At [facility], you and your health needs are our priority.  As part of our continuing mission to provide you with exceptional heart care, we have created designated Provider Care Teams.  These Care Teams include your primary Cardiologist (physician) and Advanced Practice Providers (APPs -  Physician Assistants and Nurse Practitioners) who all work together to provide you with the care you need, when you need it.  We recommend signing up for the patient portal called "MyChart".  Sign up information is provided on this After Visit Summary.  MyChart is used to connect with patients for Virtual Visits (Telemedicine).  Patients are able to view lab/test results, encounter notes, upcoming appointments, etc.  Non-urgent messages can be sent to your provider as well.   To learn more about what you can do with MyChart, go to [email or web address].    Your next appointment:   6 month(s)  The format for your next appointment:   In Person  Provider:   You may see Yvonne Kendall, MD or one of the following Advanced Practice Providers on your designated Care Team:   Nicolasa Ducking, NP Eula Listen, PA-C Marisue Ivan, PA-C Cadence Fransico Michael, [place]   Other Instructions  Please reach out to ENT at [facility] for further evaluation of your cough.

## 2021-07-27 ENCOUNTER — Encounter: Payer: Self-pay | Admitting: Internal Medicine

## 2021-08-20 ENCOUNTER — Telehealth: Payer: Self-pay | Admitting: Pulmonary Disease

## 2021-08-20 MED ORDER — ALBUTEROL SULFATE HFA 108 (90 BASE) MCG/ACT IN AERS: 2.0000 | INHALATION_SPRAY | Freq: Four times a day (QID) | RESPIRATORY_TRACT | 0 refills | Status: DC | PRN

## 2021-08-20 NOTE — Telephone Encounter (Signed)
Lm for patient.  Proair is not covered by insurance according to epic.

## 2021-08-20 NOTE — Telephone Encounter (Signed)
Spoke to patient via telephone.   Please see 08/20/21 phone note.

## 2021-08-20 NOTE — Telephone Encounter (Signed)
Spoke to patient, who is requesting refill on proair. She is aware that proair is not covered by insurance. She would like to use goodrx.  Rx sent to [facility] per patient request.   She also stated that she has been having coughing spells that are followed by sob x3d. C/o dry cough at times prod cough with clear sputum, chest tightness and sob.  Denied f/c/s or additional sx.  She is using albuterol Q6H and Breztri BID with some relief.  No recent covid test.    Dr. Jayme Cloud, please advise. Thanks

## 2021-08-20 NOTE — Telephone Encounter (Signed)
I recommend she do at least a in-home COVID test as we are seeing quite a few cases in the community.  This will help guide me as to what to provide for her symptoms.

## 2021-08-20 NOTE — Addendum Note (Signed)
Addended by: Lajoyce Lauber A on: 08/20/2021 03:36 PM   Modules accepted: Orders

## 2021-08-20 NOTE — Telephone Encounter (Signed)
Patient is aware of below message and voiced her understanding.  She will call back with results.  Nothing further needed at this time.

## 2021-08-20 NOTE — Telephone Encounter (Signed)
Please see duplicate phone note dated as 08/20/2021

## 2021-08-20 NOTE — Telephone Encounter (Signed)
Patient returned call(please see duplicate message).  Lm for patient.

## 2021-08-21 MED ORDER — PREDNISONE 10 MG (21) PO TBPK: ORAL_TABLET | ORAL | 0 refills | Status: DC

## 2021-08-21 NOTE — Telephone Encounter (Addendum)
Home covid test was negative.  C/o head congestion and sinus pressure and drainage along with the cough.  Dr. Jayme Cloud, please advise. Thanks

## 2021-08-21 NOTE — Telephone Encounter (Signed)
Recommend prednisone taper pack.  If her sinus issues continue to recommend evaluation by ENT.

## 2021-08-21 NOTE — Telephone Encounter (Signed)
Patient is aware of below recommendations.  Prednisone has been sent to preferred pharmacy. Nothing further needed.

## 2021-08-21 NOTE — Addendum Note (Signed)
Addended by: Lajoyce Lauber A on: 08/21/2021 08:50 AM   Modules accepted: Orders

## 2021-09-24 ENCOUNTER — Telehealth: Payer: Self-pay | Admitting: Pulmonary Disease

## 2021-09-24 DIAGNOSIS — J449 Chronic obstructive pulmonary disease, unspecified: Secondary | ICD-10-CM

## 2021-09-24 DIAGNOSIS — J4489 Other specified chronic obstructive pulmonary disease: Secondary | ICD-10-CM

## 2021-09-24 MED ORDER — BREZTRI AEROSPHERE 160-9-4.8 MCG/ACT IN AERO: 2.0000 | INHALATION_SPRAY | Freq: Two times a day (BID) | RESPIRATORY_TRACT | 11 refills | Status: DC

## 2021-09-24 NOTE — Telephone Encounter (Signed)
Received approval from [employer] and Rx request. Rx printed and placed in Dr. Georgann Housekeeper folder for signature.  Once signed, Rx will need to be faxed to [facility] and notify patient.

## 2021-09-24 NOTE — Telephone Encounter (Signed)
Lm for patient.  

## 2021-09-27 ENCOUNTER — Encounter: Payer: Self-pay | Admitting: Pulmonary Disease

## 2021-09-27 ENCOUNTER — Ambulatory Visit (INDEPENDENT_AMBULATORY_CARE_PROVIDER_SITE_OTHER): Payer: Medicare Other | Admitting: Pulmonary Disease

## 2021-09-27 ENCOUNTER — Other Ambulatory Visit: Payer: Self-pay

## 2021-09-27 VITALS — BP 110/76 | HR 76 | Temp 97.5°F | Ht 62.0 in | Wt 234.6 lb

## 2021-09-27 DIAGNOSIS — G4733 Obstructive sleep apnea (adult) (pediatric): Secondary | ICD-10-CM | POA: Diagnosis not present

## 2021-09-27 DIAGNOSIS — I5189 Other ill-defined heart diseases: Secondary | ICD-10-CM

## 2021-09-27 DIAGNOSIS — R918 Other nonspecific abnormal finding of lung field: Secondary | ICD-10-CM | POA: Diagnosis not present

## 2021-09-27 DIAGNOSIS — Z9989 Dependence on other enabling machines and devices: Secondary | ICD-10-CM

## 2021-09-27 DIAGNOSIS — K219 Gastro-esophageal reflux disease without esophagitis: Secondary | ICD-10-CM | POA: Diagnosis not present

## 2021-09-27 DIAGNOSIS — Z6841 Body Mass Index (BMI) 40.0 and over, adult: Secondary | ICD-10-CM

## 2021-09-27 DIAGNOSIS — J449 Chronic obstructive pulmonary disease, unspecified: Secondary | ICD-10-CM

## 2021-09-27 MED ORDER — BREZTRI AEROSPHERE 160-9-4.8 MCG/ACT IN AERO: 160.0000 ug | INHALATION_SPRAY | Freq: Two times a day (BID) | RESPIRATORY_TRACT | 0 refills | Status: DC

## 2021-09-27 MED ORDER — OMEPRAZOLE 40 MG PO CPDR: 40.0000 mg | DELAYED_RELEASE_CAPSULE | Freq: Every day | ORAL | 3 refills | Status: DC

## 2021-09-27 NOTE — Patient Instructions (Signed)
Your omeprazole has been increased to 40 mg.  You should take it before the last meal of the day.  I think your episodes that you are having at nighttime are related to reflux.  Your compliance with the CPAP is outstanding.  We will give you a sample of Breztri and we have filled out the prescription for AstraZeneca.  We will see him in follow-up in 3 months time call sooner should any problems arise

## 2021-09-27 NOTE — Progress Notes (Signed)
Subjective:    Patient ID: Rhonda Oconnell, female    DOB: 1951-09-08, 70 y.o.   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  This is a scheduled visit.  She was last seen on 05/16/2021 she has issues with chronic recurrent sinusitis and issues with cyclical cough.  She has sleep apnea and is on CPAP at 11 cm H2O. She has been compliant with her CPAP, and today her compliance report looks excellent.  She has had a few nights where significant leak was noted however her compliance is very good.  She usually detects the leak and corrects the mask fit.  She has been doing well on Breztri 2 puffs twice a day.  She notes that this is helping her and it has decreased her cough significantly.  She uses a spacer with her inhaler.  She does report that she has had 2 episodes where she woke up gasping for air even with the CPAP.  This was not relieved with the albuterol.  It felt like she had some burning sensation in the chest.  Previously she had been on twice a day omeprazole but has decreased this to once a day, 20 mg, due to insurance not covering twice a day omeprazole.  She does have issues with gastroesophageal reflux, did note that she did better on twice a day omeprazole.  She has not had any fevers, chills or sweats.  No lower extremity edema.  Cough is baseline but markedly improved on Breztri.  No tachypalpitations.  Follows regularly with cardiology.  Negative stress test June 2021.  She does not endorse any other symptomatology.  Overall she feels well and looks well.    Review of Systems A 10 point review of systems was performed and it is as noted above otherwise negative.  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 2 packs a day at most   Substance Use Topics   Alcohol use: No   Allergies  Allergen Reactions   Iodinated Diagnostic Agents Hives   Sulfa Antibiotics Hives   Iodine Hives   Current Meds  Medication Sig   acetaminophen (TYLENOL) 500 MG tablet Take 500 mg by mouth every 6 (six) hours as needed.   albuterol (PROAIR HFA) 108 (90 Base) MCG/ACT inhaler Inhale 2 puffs into the lungs every 6 (six) hours as needed for wheezing or shortness of breath.  aspirin EC 81 MG tablet Take 81 mg by mouth daily.   atorvastatin (LIPITOR) 20 MG tablet Take 20 mg by mouth daily.   Budeson-Glycopyrrol-Formoterol (BREZTRI AEROSPHERE) 160-9-4.8 MCG/ACT AERO Inhale 2 puffs into the lungs in the morning and at bedtime.   Calcium Carbonate-Vitamin D (OSCAL 500/200 D-3 PO) Take 1 tablet by mouth 2 (two) times daily.   cetirizine (ZYRTEC) 10 MG tablet Take 1 tablet (10 mg total) by mouth daily.   chlorpheniramine (CHLOR-TRIMETON) 4 MG tablet Take 4 mg by mouth 2 (two) times daily as needed for allergies.   diphenhydramine-acetaminophen (TYLENOL PM) 25-500 MG TABS tablet Take 1 tablet by mouth at bedtime as needed.   fluticasone (FLONASE) 50 MCG/ACT  nasal spray Place 2 sprays into both nostrils daily.   furosemide (LASIX) 20 MG tablet TAKE 1 TABLET BY MOUTH EVERY DAY (Patient taking differently: daily as needed.)   hydrochlorothiazide (HYDRODIURIL) 12.5 MG tablet Take 1 tablet by mouth daily.   losartan (COZAAR) 100 MG tablet Take 100 mg by mouth daily.   montelukast (SINGULAIR) 10 MG tablet Take 10 mg by mouth at bedtime.   Multiple Vitamin (MULTIVITAMIN) tablet Take 1 tablet by mouth daily.   NON FORMULARY CPAP at bedtime.   omeprazole (PRILOSEC) 20 MG capsule Take 20 mg by mouth 2 (two) times daily before a meal.   Spacer/Aero-Holding Chambers DEVI 1 each by Does not apply route daily.   Immunization History  Administered Date(s) Administered   Fluad Quad(high Dose 65+) 09/15/2019   Influenza Inj Mdck Quad Pf 10/01/2018   Influenza Split 10/31/2015   Influenza-Unspecified 10/20/2017, 10/02/2020   Pneumococcal Conjugate-13 08/29/2016   Pneumococcal Polysaccharide-23 03/22/2011, 10/20/2017   Zoster Recombinat (Shingrix) 01/08/2019, 05/05/2019       Objective:   Physical Exam BP 110/76 (BP Location: Left Arm, Patient Position: Sitting, Cuff Size: Normal)   Pulse 76   Temp (!) 97.5 F (36.4 C) (Oral)   Ht 5\' 2"  (1.575 m)   Wt 234 lb 9.6 oz (106.4 kg)   SpO2 99%   BMI 42.91 kg/m  GENERAL: Obese woman, no acute respiratory distress, fully ambulatory.  No conversational dyspnea.  Almost constant throat clearing. HEAD: Normocephalic, atraumatic. EYES: Pupils equal, round, reactive to light.  No scleral icterus.  Mild periorbital puffiness. MOUTH: Nose/mouth/throat not examined due to masking requirements for COVID 19. NECK: Supple. No thyromegaly. Trachea midline. No JVD.  No adenopathy. PULMONARY: Good air entry bilaterally, coarse, no other adventitious sounds. CARDIOVASCULAR: S1 and S2. Regular rate and rhythm.  Grade 1/6 to 2/6 systolic ejection murmur left sternal border. No change from prior.   GASTROINTESTINAL: Obese,  benign. MUSCULOSKELETAL: No joint deformity, no clubbing, no edema. NEUROLOGIC: Fully ambulatory with no gait disturbance noted.  Awake, alert, speech fluent, no overt focal deficits SKIN: Intact,warm,dry.  On limited exam no rashes. PSYCH: Normal mood and behavior.      Assessment & Plan:     ICD-10-CM   1. Asthma-COPD overlap syndrome (HCC)  J44.9    Continue Breztri and as needed albuterol Follow-up in 3 months time    2. OSA on CPAP  G47.33    Z99.89    Great compliance Suspect nocturnal awakenings due to reflux    3. Lung nodules  R91.8    Deemed benign     4. GERD without esophagitis  K21.9    Increase omeprazole to 40 mg daily If persistent breakthrough symptoms will need referral to GI    5. Grade II diastolic dysfunction  I51.89    This issue adds complexity to her management Followed by cardiology    6. Morbid obesity with BMI of 40.0-44.9, adult (HCC)  E66.01    Z68.41    Weight loss recommended     Meds ordered this encounter  Medications   omeprazole (PRILOSEC) 40 MG capsule    Sig: Take 1 capsule (40 mg total) by mouth daily.    Dispense:  30 capsule    Refill:  3   Budeson-Glycopyrrol-Formoterol (BREZTRI AEROSPHERE) 160-9-4.8 MCG/ACT AERO    Sig: Inhale 160 mcg into the lungs in the morning and at bedtime.    Dispense:  5.9 g    Refill:  0    Order Specific Question:   Lot Number?    Answer:   [id number]    Order Specific Question:   Expiration Date?    Answer:   03/08/2024    Order Specific Question:   NDC    Answer:   4235-3614-43 [154008]    Order Specific Question:   Quantity    Answer:   1   Overall Rhonda Oconnell is doing well.  We will see her in follow-up in 3 months time.  If her nocturnal awakenings continue she is to let us know.  This has been 2 isolated incidents.  They are highly suspicious for reflux.  Adjusted omeprazole dose.  Discussed antireflux measures.  Gailen Shelter, MD Advanced Bronchoscopy PCCM [place]  [facility]    *This note was dictated using voice recognition software/Dragon.  Despite best efforts to proofread, errors can occur which can change the meaning.  Any change was purely unintentional.

## 2021-09-27 NOTE — Telephone Encounter (Signed)
Rx has been faxed to [facility].  Lm for patient.

## 2021-09-27 NOTE — Telephone Encounter (Signed)
Patient is aware and voiced her understanding.  °Nothing further needed.  °

## 2021-10-31 ENCOUNTER — Other Ambulatory Visit: Payer: Self-pay | Admitting: Pulmonary Disease

## 2021-12-24 ENCOUNTER — Encounter: Payer: Self-pay | Admitting: Pulmonary Disease

## 2021-12-24 NOTE — Telephone Encounter (Signed)
Dr Jayme Cloud Please advise:  I have had laryngitis since Saturday. Its hard to talk. I have developed a lot of sinus congestion and cough and headache. Can you recommend something I can take or do I need to come in to the office. I have an appointment scheduled for Monday the 23rd.   Patient states she did an at home COVID test this morning and it was negative.Patient has not been vaccinated against COVID. Patient states she uses Breztri inhaler and also uses her rescue inhaler.

## 2021-12-24 NOTE — Telephone Encounter (Signed)
We can send Azithromycin Z-Pak and a prednisone taper pack.  Keep appointment as scheduled.

## 2021-12-27 NOTE — Telephone Encounter (Signed)
Called and spoke to patient who states she was seen at [facility] and was given prednisone. I offered to send in the Zpak patient states she would like to know how she feels before anything else is sent in. She will keep follow up appointment for Monday 1/23. Nothing further needed.

## 2021-12-31 ENCOUNTER — Ambulatory Visit (INDEPENDENT_AMBULATORY_CARE_PROVIDER_SITE_OTHER): Payer: Medicare Other | Admitting: Pulmonary Disease

## 2021-12-31 ENCOUNTER — Other Ambulatory Visit: Payer: Self-pay

## 2021-12-31 ENCOUNTER — Encounter: Payer: Self-pay | Admitting: Pulmonary Disease

## 2021-12-31 VITALS — BP 140/94 | HR 75 | Temp 97.1°F | Ht 62.5 in | Wt 234.6 lb

## 2021-12-31 DIAGNOSIS — I5189 Other ill-defined heart diseases: Secondary | ICD-10-CM | POA: Diagnosis not present

## 2021-12-31 DIAGNOSIS — J4489 Other specified chronic obstructive pulmonary disease: Secondary | ICD-10-CM

## 2021-12-31 DIAGNOSIS — J449 Chronic obstructive pulmonary disease, unspecified: Secondary | ICD-10-CM | POA: Diagnosis not present

## 2021-12-31 DIAGNOSIS — J209 Acute bronchitis, unspecified: Secondary | ICD-10-CM

## 2021-12-31 DIAGNOSIS — Z9989 Dependence on other enabling machines and devices: Secondary | ICD-10-CM

## 2021-12-31 DIAGNOSIS — G4733 Obstructive sleep apnea (adult) (pediatric): Secondary | ICD-10-CM

## 2021-12-31 DIAGNOSIS — K219 Gastro-esophageal reflux disease without esophagitis: Secondary | ICD-10-CM

## 2021-12-31 MED ORDER — BENZONATATE 200 MG PO CAPS: 200.0000 mg | ORAL_CAPSULE | Freq: Three times a day (TID) | ORAL | 0 refills | Status: DC | PRN

## 2021-12-31 MED ORDER — AZITHROMYCIN 250 MG PO TABS: ORAL_TABLET | ORAL | 0 refills | Status: AC

## 2021-12-31 NOTE — Progress Notes (Signed)
Subjective:    Patient ID: Rhonda Oconnell, female    DOB: [birth year], 71 y.o.   MRN: [id number] Chief Complaint  Patient presents with   Follow-up   HPI Rhonda Oconnell is a 71 year old female former smoker (50 PY) who presents for follow-up on COPD/asthma overlap.  This is a scheduled visit.  She was last seen on 27 September 2021 at that time was noted to be doing well without major issues.  She does have sleep apnea and is compliant with her CPAP at 11 cm H2O, she brings her compliance download which is on her mobile phone I have reviewed it and she has been very compliant.  Since her prior visit she has not had nocturnal awakenings after having her PPI dose adjusted.  The episodes were consistent with reflux.  Main complaint today is that [facility] and was placed on prednisone for asthma/COPD exacerbation.  She also noted laryngitis.  Onset of symptoms was around 15 January.  [facility]  He did not receive antibiotic.  At that time her sputum was only productive of clear sputum.  She states however that it has turned from yellow to green over the last few days, no fever but some generalized malaise.  She does not endorse increasing shortness of breath.  No chest pain.  No orthopnea or paroxysmal nocturnal dyspnea.  No lower extremity edema.   Review of Systems A 10 point review of systems was performed and it is as noted above otherwise negative.  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 2 packs a day at most   Substance Use Topics   Alcohol use: No   Allergies  Allergen Reactions   Iodinated Contrast Media Hives   Sulfa Antibiotics Hives   Iodine Hives    Current Meds  Medication Sig   acetaminophen (TYLENOL) 500 MG tablet Take 500 mg by mouth every 6 (six) hours as needed.   albuterol (VENTOLIN HFA) 108 (90 Base) MCG/ACT inhaler INHALE 2 PUFFS INTO THE LUNGS EVERY 6 HOURS AS NEEDED FOR WHEEZING OR SHORTNESS OF BREATH   aspirin EC 81 MG tablet Take 81 mg by mouth daily.   atorvastatin (LIPITOR) 20 MG tablet Take 20 mg by mouth daily.   Budeson-Glycopyrrol-Formoterol (BREZTRI AEROSPHERE) 160-9-4.8 MCG/ACT AERO Inhale 2 puffs into the lungs in the morning and at bedtime.   Budeson-Glycopyrrol-Formoterol (BREZTRI AEROSPHERE) 160-9-4.8 MCG/ACT AERO Inhale 160 mcg into the lungs in the morning and at bedtime.   Calcium Carbonate-Vitamin D (OSCAL 500/200 D-3 PO)  Take 1 tablet by mouth 2 (two) times daily.   cetirizine (ZYRTEC) 10 MG tablet Take 1 tablet (10 mg total) by mouth daily.   chlorpheniramine (CHLOR-TRIMETON) 4 MG tablet Take 4 mg by mouth 2 (two) times daily as needed for allergies.   diphenhydramine-acetaminophen (TYLENOL PM) 25-500 MG TABS tablet Take 1 tablet by mouth at bedtime as needed.   fluticasone (FLONASE) 50 MCG/ACT nasal spray Place 2 sprays into both nostrils daily.   furosemide (LASIX) 20 MG tablet TAKE 1 TABLET BY MOUTH EVERY DAY (Patient taking differently: daily as needed.)   hydrochlorothiazide (HYDRODIURIL) 12.5 MG tablet Take 1 tablet by mouth daily.    losartan (COZAAR) 100 MG tablet Take 100 mg by mouth daily.   montelukast (SINGULAIR) 10 MG tablet Take 10 mg by mouth at bedtime.   Multiple Vitamin (MULTIVITAMIN) tablet Take 1 tablet by mouth daily.   NON FORMULARY CPAP at bedtime.   omeprazole (PRILOSEC) 40 MG capsule Take 1 capsule (40 mg total) by mouth daily.   Spacer/Aero-Holding Chambers DEVI 1 each by Does not apply route daily.   Immunization History  Administered Date(s) Administered   Fluad Quad(high Dose 65+) 09/15/2019   Influenza Inj Mdck Quad Pf 10/01/2018   Influenza Split 10/31/2015   Influenza-Unspecified 10/20/2017, 10/02/2020   Pneumococcal Conjugate-13 08/29/2016   Pneumococcal Polysaccharide-23 03/22/2011, 10/20/2017   Zoster Recombinat (Shingrix) 01/08/2019, 05/05/2019        Objective:   Physical Exam BP (!) 140/94 (BP Location: Left Arm, Patient Position: Sitting, Cuff Size: Normal)    Pulse 75    Temp (!) 97.1 F (36.2 C) (Oral)    Ht 5' 2.5" (1.588 m)    Wt 234 lb 9.6 oz (106.4 kg)    SpO2 98%    BMI 42.23 kg/m  GENERAL: Obese woman, no acute respiratory distress, fully ambulatory.  No conversational dyspnea.  Hoarse voice. HEAD: Normocephalic, atraumatic. EYES: Pupils equal, round, reactive to light.  No scleral icterus.  Mild periorbital puffiness. MOUTH: Nose/mouth/throat not examined due to masking requirements for COVID 19. NECK: Supple. No thyromegaly. Trachea midline. No JVD.  No adenopathy. PULMONARY: Good air entry bilaterally, coarse, no other adventitious sounds. CARDIOVASCULAR: S1 and S2. Regular rate and rhythm.  Grade 1/6 to 2/6 systolic ejection murmur left sternal border. No change from prior.   GASTROINTESTINAL: Obese, benign. MUSCULOSKELETAL: No joint deformity, no clubbing, no edema. NEUROLOGIC: Fully ambulatory with no gait disturbance noted.  Awake, alert, speech fluent, no overt focal deficits SKIN: Intact,warm,dry.  On limited exam no rashes. PSYCH: Normal mood and  behavior.      Assessment & Plan:     ICD-10-CM   1. Asthma-COPD overlap syndrome (HCC)  J44.9    Continue Breztri 2 puffs twice a day Continue as needed albuterol Recent mild exacerbation Partly responded to steroids    2. Acute tracheobronchitis  J20.9    Azithromycin Z-Pak     3. GERD without esophagitis  K21.9    Continue PPI    4. Grade II diastolic dysfunction  I51.89    This issue adds complexity to her management Followed by cardiology    5. OSA on CPAP  G47.33    Z99.89    CPAP at 11 cm H2O Patient compliant with same     Meds ordered this encounter  Medications   azithromycin (ZITHROMAX) 250 MG tablet    Sig: Take 2 tablets (500 mg) on  Day 1,  followed by 1 tablet (250 mg) once daily on  Days 2 through 5.    Dispense:  6 each    Refill:  0   benzonatate (TESSALON) 200 MG capsule    Sig: Take 1 capsule (200 mg total) by mouth 3 (three) times daily as needed for cough.    Dispense:  20 capsule    Refill:  0   Patient is to continue [place] as she is doing.  Continue CPAP use, compliance has been very good.  We did prescribe azithromycin and Tessalon Per[place] for her cough due to tracheobronchitis.  We will see her in follow-up in 4 to 6 weeks time she is to call sooner should any new difficulties arise.   Gailen Shelter, MD Advanced Bronchoscopy PCCM [place] [facility]    *This note was dictated using voice recognition software/Dragon.  Despite best efforts to proofread, errors can occur which can change the meaning. Any transcriptional errors that result from this process are unintentional and may not be fully corrected at the time of dictation.

## 2021-12-31 NOTE — Patient Instructions (Signed)
Continue Breztri as you are doing.  Continue your CPAP.  Compliance looks very good.   I have sent a prescription for a Z-Pak and some Tessalon (for cough) to your pharmacy.   We will see her in follow-up in 4 to 6 weeks time call sooner should any new problems arise.

## 2022-01-16 ENCOUNTER — Other Ambulatory Visit: Payer: Self-pay | Admitting: Pulmonary Disease

## 2022-01-16 ENCOUNTER — Telehealth: Payer: Self-pay | Admitting: Pulmonary Disease

## 2022-01-16 ENCOUNTER — Other Ambulatory Visit: Payer: Self-pay

## 2022-01-16 ENCOUNTER — Telehealth (INDEPENDENT_AMBULATORY_CARE_PROVIDER_SITE_OTHER): Payer: Medicare Other | Admitting: Nurse Practitioner

## 2022-01-16 ENCOUNTER — Encounter: Payer: Self-pay | Admitting: Nurse Practitioner

## 2022-01-16 DIAGNOSIS — B9689 Other specified bacterial agents as the cause of diseases classified elsewhere: Secondary | ICD-10-CM | POA: Diagnosis not present

## 2022-01-16 DIAGNOSIS — J209 Acute bronchitis, unspecified: Secondary | ICD-10-CM | POA: Insufficient documentation

## 2022-01-16 DIAGNOSIS — J441 Chronic obstructive pulmonary disease with (acute) exacerbation: Secondary | ICD-10-CM | POA: Insufficient documentation

## 2022-01-16 DIAGNOSIS — J019 Acute sinusitis, unspecified: Secondary | ICD-10-CM | POA: Diagnosis not present

## 2022-01-16 DIAGNOSIS — J45901 Unspecified asthma with (acute) exacerbation: Secondary | ICD-10-CM

## 2022-01-16 MED ORDER — DOXYCYCLINE MONOHYDRATE 100 MG PO CAPS: 100.0000 mg | ORAL_CAPSULE | Freq: Two times a day (BID) | ORAL | 0 refills | Status: DC

## 2022-01-16 MED ORDER — PREDNISONE 10 MG PO TABS: ORAL_TABLET | ORAL | 0 refills | Status: DC

## 2022-01-16 MED ORDER — AZELASTINE HCL 0.1 % NA SOLN: 2.0000 | Freq: Two times a day (BID) | NASAL | 2 refills | Status: AC

## 2022-01-16 NOTE — Assessment & Plan Note (Signed)
Initial improvement; now worse. Prednisone taper. Supportive care. Advised voice rest and avoid throat clearing. See above.

## 2022-01-16 NOTE — Telephone Encounter (Signed)
Spoke to patient.  Tx with prednisone 12/27/2021 zpak 12/31/2021. Sx improved but did not completely subside. C/o voice hoarseness, sinus congestion, dry cough at times with yellow sputum, sob with coughing spells and wheezing. Appt scheduled 01/16/2022 at 4:00. Nothing further needed.

## 2022-01-16 NOTE — Assessment & Plan Note (Signed)
Mild flare in SOB and wheezing. Suspect cough r/t bronchitis and exacerbated by postnasal drip and upper airway irritation. Completed prednisone 5 day course in mid January. Will tx today with prednisone taper.   Patient Instructions  Continue Albuterol inhaler 2 puffs every 6 hours as needed for shortness of breath or wheezing. Notify if symptoms persist despite rescue inhaler/neb use. -Continue Zytrec 10 mg daily -Continue Breztri 2 puffs Twice daily, brush tongue and rinse mouth afterwards. Use with spacer  -Continue flonase nasal spray 1-2 sprays each nostril daily  -Continue chlorpheniramine 4 mg Twice daily as needed for allergies -Continue Singulair 10 mg At bedtime   -Astelin nasal spray 2 sprays each nostril Twice daily  -Saline nasal irrigation 1-2 times a day -Mucinex 600 mg Twice daily for congestion -Prednisone taper. 4 tabs for 2 days, then 3 tabs for 2 days, 2 tabs for 2 days, then 1 tab for 2 days, then stop. Take in AM with food -Doxycycline 100 mg Twice daily for 7 days. Notify immediately of any rash, itching, hives, or swelling, or seek emergency care.Finish your antibiotics in their entirety. Do not stop just because symptoms improve.  Avoid direct sun exposure and wear sunscreen while taking.   Avoid throat clearing. Voice rest. Sugar free hard candies   Follow up on 2/28 as previously scheduled. If symptoms do not improve or worsen, please contact office for sooner follow up or seek emergency care.

## 2022-01-16 NOTE — Telephone Encounter (Signed)
Lm for patient.   Last prescribed zpak 12/31/2021. It would be best to keep scheduled visit.

## 2022-01-16 NOTE — Progress Notes (Signed)
Patient ID: Rhonda Oconnell, female     DOB: [birth year], 71 y.o.      MRN: [id number]  Chief Complaint  Patient presents with   Follow-up    She reports that she is having some nasal congestion and laryngitis. She had some prednisone and her Zpack has run out. She reports that her symptoms are back. Mostly dry hacky cough,     Virtual Visit via Video Note  I connected with Rhonda Oconnell on 01/16/22 at  4:00 PM EST by a video enabled telemedicine application and verified that I am speaking with the correct person using two identifiers.  Location: Patient: Home Provider: Office   I discussed the limitations of evaluation and management by telemedicine and the availability of in person appointments. The patient expressed understanding and agreed to proceed.  History of Present Illness: 71 year old female, former smoker (50 pack years) followed for asthma/COPD overlap, OSA and allergic rhinitis. She is a patient of Dr. Patsey Berthold and last seen in office on 12/31/2021. Past medical history significant for HTN, HFpEF, LPR, obesity.   12/31/2021: OV with Dr. Patsey Berthold. Tx for asthma/COPD exacerbation and laryngitis at [facility] with prednisone. Still having a productive cough and increased generalized malaise. Z pack and tessalon perles rx. Continued on Breztri. Continue CPAP 11 cmH2O.   01/16/2022: Today-acute visit Patient presents today for reports of increased nasal congestion and laryngitis.  She has also been having a dry, hacking cough.  She did feel better after being treated with prednisone earlier in January and Z-Pak towards the end of January.  Now, her symptoms have returned.  She has some increased shortness of breath upon exertion and associated wheezing. Her cough remains non-productive. She is more hoarse after finishing prednisone. She continues to have nasal drainage and congestion. She denies fevers, orthopnea, PND, chest pain or lower extremity swelling. She continues on Breztri Twice  daily with spacer. She has not been using her rescue inhaler much. She continues on Zyrtec, flonase and singulair.   Allergies  Allergen Reactions   Iodinated Contrast Media Hives   Iodine Hives   Sulfa Antibiotics Hives   Immunization History  Administered Date(s) Administered   Fluad Quad(high Dose 65+) 09/15/2019   Influenza Inj Mdck Quad Pf 10/01/2018   Influenza Split 10/31/2015   Influenza-Unspecified 10/20/2017, 10/02/2020   Pneumococcal Conjugate-13 08/29/2016   Pneumococcal Polysaccharide-23 03/22/2011, 10/20/2017   Zoster Recombinat (Shingrix) 01/08/2019, 05/05/2019   Past Medical History:  Diagnosis Date   Abdominal hernia    Allergic state    Anemia    Arthritis    Asthma    Cataract cortical, senile    COPD (chronic obstructive pulmonary disease) (HCC)    Dysphagia    GERD (gastroesophageal reflux disease)    Hemorrhoids    Hyperlipidemia    Hypertension    Rectal tear    Rectocele     Tobacco History: Social History   Tobacco Use  Smoking Status Former   Packs/day: 2.00   Years: 25.00   Pack years: 50.00   Types: Cigarettes   Quit date: 08/15/1993   Years since quitting: 28.4  Smokeless Tobacco Never  Tobacco Comments   Quit smoking 25 years ago. 2 packs a day at most    Counseling given: Not Answered Tobacco comments: Quit smoking 25 years ago. 2 packs a day at most    Outpatient Medications Prior to Visit  Medication Sig Dispense Refill   acetaminophen (TYLENOL) 500 MG tablet Take 500  mg by mouth every 6 (six) hours as needed.     albuterol (VENTOLIN HFA) 108 (90 Base) MCG/ACT inhaler INHALE 2 PUFFS INTO THE LUNGS EVERY 6 HOURS AS NEEDED FOR WHEEZING OR SHORTNESS OF BREATH 54 g 0   aspirin EC 81 MG tablet Take 81 mg by mouth daily.     atorvastatin (LIPITOR) 20 MG tablet Take 20 mg by mouth daily.     Budeson-Glycopyrrol-Formoterol (BREZTRI AEROSPHERE) 160-9-4.8 MCG/ACT AERO Inhale 2 puffs into the lungs in the morning and at bedtime. 5.9 g  11   Calcium Carbonate-Vitamin D (OSCAL 500/200 D-3 PO) Take 1 tablet by mouth 2 (two) times daily.     cetirizine (ZYRTEC) 10 MG tablet Take 1 tablet (10 mg total) by mouth daily. 90 tablet 3   chlorpheniramine (CHLOR-TRIMETON) 4 MG tablet Take 4 mg by mouth 2 (two) times daily as needed for allergies.     diphenhydramine-acetaminophen (TYLENOL PM) 25-500 MG TABS tablet Take 1 tablet by mouth at bedtime as needed.     fluticasone (FLONASE) 50 MCG/ACT nasal spray Place 2 sprays into both nostrils daily. 32 g 3   furosemide (LASIX) 20 MG tablet TAKE 1 TABLET BY MOUTH EVERY DAY (Patient taking differently: daily as needed.) 90 tablet 2   hydrochlorothiazide (HYDRODIURIL) 12.5 MG tablet Take 1 tablet by mouth daily.     losartan (COZAAR) 100 MG tablet Take 100 mg by mouth daily.     montelukast (SINGULAIR) 10 MG tablet Take 10 mg by mouth at bedtime.     Multiple Vitamin (MULTIVITAMIN) tablet Take 1 tablet by mouth daily.     NON FORMULARY CPAP at bedtime.     omeprazole (PRILOSEC) 40 MG capsule TAKE 1 CAPSULE (40 MG TOTAL) BY MOUTH DAILY. 90 capsule 1   Spacer/Aero-Holding Chambers DEVI 1 each by Does not apply route daily. 1 each 0   benzonatate (TESSALON) 200 MG capsule Take 1 capsule (200 mg total) by mouth 3 (three) times daily as needed for cough. (Patient not taking: Reported on 01/16/2022) 20 capsule 0   No facility-administered medications prior to visit.     Review of Systems:   Constitutional: No weight loss or gain, night sweats, fevers, chills, fatigue, or lassitude. HEENT: No headaches, difficulty swallowing, tooth/dental problems, or sore throat. No sneezing, itching, ear ache. +nasal congestion; rhinorrhea; sinus pressure; hoarseness CV:  No chest pain, orthopnea, PND, swelling in lower extremities, anasarca, dizziness, palpitations, syncope Resp: +shortness of breath with exertion; dry, hacking cough; some wheeze. No excess mucus or change in color of mucus. No hemoptysis. No  chest wall deformity GI:  No heartburn, indigestion, abdominal pain, nausea, vomiting, diarrhea, change in bowel habits, loss of appetite, bloody stools.  GU: No dysuria, change in color of urine, urgency or frequency.  No flank pain, no hematuria  Skin: No rash, lesions, ulcerations MSK:  No joint pain or swelling.  No decreased range of motion.  No back pain. Neuro: No dizziness or lightheadedness.  Psych: No depression or anxiety. Mood stable.   Observations/Objective: Patient is well-developed, well-nourished in no acute distress. A&Ox3.  Unlabored, regular breathing. Speech is clear and coherent with logical content. Dry cough noted during exam. Voice is hoarse.    Assessment and Plan: Acute exacerbation of COPD with asthma ([place]) Mild flare in SOB and wheezing. Suspect cough r/t bronchitis and exacerbated by postnasal drip and upper airway irritation. Completed prednisone 5 day course in mid January. Will tx today with prednisone taper.   Patient  Instructions  Continue Albuterol inhaler 2 puffs every 6 hours as needed for shortness of breath or wheezing. Notify if symptoms persist despite rescue inhaler/neb use. -Continue Zytrec 10 mg daily -Continue Breztri 2 puffs Twice daily, brush tongue and rinse mouth afterwards. Use with spacer  -Continue flonase nasal spray 1-2 sprays each nostril daily  -Continue chlorpheniramine 4 mg Twice daily as needed for allergies -Continue Singulair 10 mg At bedtime   -Astelin nasal spray 2 sprays each nostril Twice daily  -Saline nasal irrigation 1-2 times a day -Mucinex 600 mg Twice daily for congestion -Prednisone taper. 4 tabs for 2 days, then 3 tabs for 2 days, 2 tabs for 2 days, then 1 tab for 2 days, then stop. Take in AM with food -Doxycycline 100 mg Twice daily for 7 days. Notify immediately of any rash, itching, hives, or swelling, or seek emergency care.Finish your antibiotics in their entirety. Do not stop just because symptoms improve.   Avoid direct sun exposure and wear sunscreen while taking.   Avoid throat clearing. Voice rest. Sugar free hard candies   Follow up on 2/28 as previously scheduled. If symptoms do not improve or worsen, please contact office for sooner follow up or seek emergency care.    Acute rhinosinusitis Persistent symptoms. Slight improvement after z pack. Given length of symptoms and worsening, will tx with doxy 7 day course. Rx astelin Twice daily. Advised saline irrigations 1-2 times a day.   Acute tracheobronchitis Initial improvement; now worse. Prednisone taper. Supportive care. Advised voice rest and avoid throat clearing. See above.    Follow Up Instructions: Follow up 2/28 as previously scheduled. If symptoms do not improve or worsen, please contact office for sooner follow up or seek emergency care.    I discussed the assessment and treatment plan with the patient. The patient was provided an opportunity to ask questions and all were answered. The patient agreed with the plan and demonstrated an understanding of the instructions.   The patient was advised to call back or seek an in-person evaluation if the symptoms worsen or if the condition fails to improve as anticipated.  I provided 23 minutes of non-face-to-face time during this encounter.   Clayton Bibles, NP

## 2022-01-16 NOTE — Assessment & Plan Note (Signed)
Persistent symptoms. Slight improvement after z pack. Given length of symptoms and worsening, will tx with doxy 7 day course. Rx astelin Twice daily. Advised saline irrigations 1-2 times a day.

## 2022-01-16 NOTE — Patient Instructions (Addendum)
Continue Albuterol inhaler 2 puffs every 6 hours as needed for shortness of breath or wheezing. Notify if symptoms persist despite rescue inhaler/neb use. -Continue Zytrec 10 mg daily -Continue Breztri 2 puffs Twice daily, brush tongue and rinse mouth afterwards. Use with spacer  -Continue flonase nasal spray 1-2 sprays each nostril daily  -Continue chlorpheniramine 4 mg Twice daily as needed for allergies -Continue Singulair 10 mg At bedtime   -Astelin nasal spray 2 sprays each nostril Twice daily  -Saline nasal irrigation 1-2 times a day -Mucinex 600 mg Twice daily for congestion -Prednisone taper. 4 tabs for 2 days, then 3 tabs for 2 days, 2 tabs for 2 days, then 1 tab for 2 days, then stop. Take in AM with food -Doxycycline 100 mg Twice daily for 7 days. Notify immediately of any rash, itching, hives, or swelling, or seek emergency care.Finish your antibiotics in their entirety. Do not stop just because symptoms improve.  Avoid direct sun exposure and wear sunscreen while taking.   Avoid throat clearing. Voice rest. Sugar free hard candies   Follow up on 2/28 as previously scheduled. If symptoms do not improve or worsen, please contact office for sooner follow up or seek emergency care.

## 2022-01-17 NOTE — Progress Notes (Signed)
Agree with the details of the noted by Micheline Maze, NP  C. Danice Goltz, MD Advanced Bronchoscopy PCCM [facility]

## 2022-01-30 ENCOUNTER — Other Ambulatory Visit: Payer: Self-pay

## 2022-01-30 ENCOUNTER — Ambulatory Visit (INDEPENDENT_AMBULATORY_CARE_PROVIDER_SITE_OTHER): Payer: Medicare Other | Admitting: Internal Medicine

## 2022-01-30 ENCOUNTER — Encounter: Payer: Self-pay | Admitting: Internal Medicine

## 2022-01-30 VITALS — BP 130/80 | HR 85 | Ht 62.0 in | Wt 241.0 lb

## 2022-01-30 DIAGNOSIS — R053 Chronic cough: Secondary | ICD-10-CM | POA: Insufficient documentation

## 2022-01-30 DIAGNOSIS — I5032 Chronic diastolic (congestive) heart failure: Secondary | ICD-10-CM

## 2022-01-30 DIAGNOSIS — I10 Essential (primary) hypertension: Secondary | ICD-10-CM | POA: Diagnosis not present

## 2022-01-30 NOTE — Progress Notes (Signed)
Follow-up Outpatient Visit Date: 01/30/2022  Primary Care Provider: Derinda Late, MD [address] [facility] [place] [address]  Chief Complaint: Follow-up HFpEF  HPI:  Ms. Conti is a 71 y.o. 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  I last saw her in 07/2021, at which time Ms. Mahany reported stable chronic exertional dyspnea.  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  She was advised to follow-up in 3 to 6 months but had not yet done that.  I encouraged her to follow-up with ENT, given worsening of her chronic cough.  No medication changes or additional testing were pursued.  Today, Ms. Galla reports that she has been feeling fairly well though she has been dealing with more cough, sinus congestion, and laryngitis over the last month or two.  She is scheduled to see pulmonary next week.  She has not returned to see ENT.  Chronic DOE is unchanged.  She has not had any chest pain, palpitations, or edema.  She only uses her as needed furosemide when she has swelling, [place] at the beach.  --------------------------------------------------------------------------------------------------  Cardiovascular History & Procedures: Cardiovascular Problems: Diastolic dysfunction and shortness of breath   Risk Factors: Hypertension, hyperlipidemia, morbid obesity, prior tobacco use, and age greater than 80   Cath/PCI: None   CV Surgery: None   EP Procedures and Devices: None   Non-Invasive Evaluation(s): Pharmacologic MPI (05/15/2020): Low risk study without ischemia or scar, though study was limited by GI uptake.  LVEF 55-65%. TTE (03/29/2020): Technically difficult study.  LVEF 55-60% with grade 2  diastolic dysfunction.  Normal RV size and function.  Grossly normal mitral valve.  Aortic sclerosis.  Recent CV Pertinent Labs: Lab Results  Component Value Date   K 4.0 06/20/2020   BUN 15 06/20/2020   CREATININE 0.82 06/20/2020    Past medical and surgical history were reviewed and updated in EPIC.  Current Meds  Medication Sig   acetaminophen (TYLENOL) 500 MG tablet Take 500 mg by mouth every 6 (six) hours as needed.   albuterol (VENTOLIN HFA) 108 (90 Base) MCG/ACT inhaler INHALE 2 PUFFS INTO THE LUNGS EVERY 6 HOURS AS NEEDED FOR WHEEZING OR SHORTNESS OF BREATH   aspirin EC 81 MG tablet Take 81 mg by mouth daily.   atorvastatin (LIPITOR) 20 MG tablet Take 20 mg by mouth daily.   azelastine (ASTELIN) 0.1 % nasal spray Place 2 sprays into both nostrils 2 (two) times daily. Use in each nostril as directed   Budeson-Glycopyrrol-Formoterol (BREZTRI AEROSPHERE) 160-9-4.8 MCG/ACT AERO Inhale 2 puffs into the lungs in the morning and at bedtime.   Calcium Carbonate-Vitamin D (OSCAL 500/200 D-3 PO) Take 1 tablet by mouth 2 (two) times daily.   cetirizine (ZYRTEC) 10 MG tablet Take 1 tablet (10 mg total) by mouth daily.   chlorpheniramine (CHLOR-TRIMETON) 4 MG tablet Take 4 mg by mouth 2 (two) times daily as needed for allergies.   diphenhydramine-acetaminophen (TYLENOL PM) 25-500 MG TABS tablet Take 1 tablet by mouth at bedtime as needed.   fluticasone (FLONASE) 50 MCG/ACT nasal spray Place 2 sprays into both nostrils daily.   furosemide (LASIX) 20 MG tablet Take 20 mg by mouth daily as needed.   hydrochlorothiazide (HYDRODIURIL) 12.5 MG tablet Take 1 tablet by mouth  daily.   losartan (COZAAR) 100 MG tablet Take 100 mg by mouth daily.   montelukast (SINGULAIR) 10 MG tablet Take 10 mg by mouth at bedtime.   Multiple Vitamin (MULTIVITAMIN) tablet Take 1 tablet by mouth daily.   NON FORMULARY CPAP at bedtime.   omeprazole (PRILOSEC) 40 MG capsule TAKE 1 CAPSULE (40 MG TOTAL) BY MOUTH DAILY.    Spacer/Aero-Holding Chambers DEVI 1 each by Does not apply route daily.    Allergies: Iodinated contrast media, Iodine, and Sulfa antibiotics  Social History   Tobacco Use   Smoking status: Former    Packs/day: 2.00    Years: 25.00    Pack years: 50.00    Types: Cigarettes    Quit date: 08/15/1993    Years since quitting: 28.4   Smokeless tobacco: Never   Tobacco comments:    Quit smoking 25 years ago. 2 packs a day at most   Vaping Use   Vaping Use: Never used  Substance Use Topics   Alcohol use: No   Drug use: No    Family History  Problem Relation Age of Onset   Varicose Veins Mother    Atrial fibrillation Mother    Cancer Father    Atrial fibrillation Brother    Aortic aneurysm Brother    Congenital heart disease Brother    Varicose Veins Maternal Grandmother     Review of Systems: A 12-system review of systems was performed and was negative except as noted in the HPI.  --------------------------------------------------------------------------------------------------  Physical Exam: BP 130/80 (BP Location: Left Arm, Patient Position: Sitting, Cuff Size: Large)    Pulse 85    Ht 5\' 2"  (1.575 m)    Wt 241 lb (109.3 kg)    SpO2 98%    BMI 44.08 kg/m   General:  NAD. Neck: No gross JVD, though body habitus limits evaluation. Lungs: Clear to auscultation bilaterally without wheezes or crackles. Heart: Regular rate and rhythm without murmurs, rubs, or gallops. Abdomen: Soft, nontender, nondistended. Extremities: No lower extremity edema.  EKG: Normal sinus rhythm without abnormality.  No results found for: WBC, HGB, HCT, MCV, PLT  Lab Results  Component Value Date   NA 137 06/20/2020   K 4.0 06/20/2020   CL 98 06/20/2020   CO2 27 06/20/2020   BUN 15 06/20/2020   CREATININE 0.82 06/20/2020   GLUCOSE 100 (H) 06/20/2020    No results found for: CHOL, HDL, LDLCALC, LDLDIRECT, TRIG,  CHOLHDL  --------------------------------------------------------------------------------------------------  ASSESSMENT AND PLAN: Chronic HFpEF: Ms. Vandervort has stable exertional dyspnea that is likely multifactorial including chronic HFpEF, COPD, and morbid obesity.  We will defer medication changes today.  I encouraged her to use as needed furosemide for any weight gain or edema.  Cough, sinus congestion, and laryngitis: Symptoms have been more pronounced over the last couple of weeks.  I encouraged Ms. Kussman to keep her follow-up appointment within the pulmonary clinic next week as well as to consider returning to see [facility], as follow-up after initial visit was recommended.  Hypertension: Blood pressure upper normal today.  No medication changes recommended at this time.  Morbid obesity: BMI remains greater than 40, with weight up about 10 pounds since last year.  Continue to work on weight loss through diet and excise.  Follow-up: Return to clinic in 1 year.  Nelva Bush, MD 01/30/2022 5:35 PM

## 2022-01-30 NOTE — Patient Instructions (Signed)
Medication Instructions:  ? ?Your physician recommends that you continue on your current medications as directed. Please refer to the Current Medication list given to you today. ? ?*If you need a refill on your cardiac medications before your next appointment, please call your pharmacy* ? ? ?Lab Work: ? ?None ordered ? ?Testing/Procedures: ? ?None ordered ? ? ?Follow-Up: ?At [facility], you and your health needs are our priority.  As part of our continuing mission to provide you with exceptional heart care, we have created designated Provider Care Teams.  These Care Teams include your primary Cardiologist (physician) and Advanced Practice Providers (APPs -  Physician Assistants and Nurse Practitioners) who all work together to provide you with the care you need, when you need it. ? ?We recommend signing up for the patient portal called "MyChart".  Sign up information is provided on this After Visit Summary.  MyChart is used to connect with patients for Virtual Visits (Telemedicine).  Patients are able to view lab/test results, encounter notes, upcoming appointments, etc.  Non-urgent messages can be sent to your provider as well.   ?To learn more about what you can do with MyChart, go to [email or web address].   ? ?Your next appointment:   ?1 year(s) ? ?The format for your next appointment:   ?In Person ? ?Provider:   ?You may see Christopher End, MD or one of the following Advanced Practice Providers on your designated Care Team:   ?Christopher Berge, NP ?Ryan Dunn, PA-C ?Cadence Furth, PA-C ?

## 2022-02-05 ENCOUNTER — Ambulatory Visit: Payer: Medicare Other | Admitting: Primary Care

## 2022-02-07 ENCOUNTER — Other Ambulatory Visit: Payer: Self-pay

## 2022-02-07 ENCOUNTER — Ambulatory Visit (INDEPENDENT_AMBULATORY_CARE_PROVIDER_SITE_OTHER): Payer: Medicare Other | Admitting: Pulmonary Disease

## 2022-02-07 ENCOUNTER — Other Ambulatory Visit
Admission: RE | Admit: 2022-02-07 | Discharge: 2022-02-07 | Disposition: A | Discharge status: Home / Self Care | Payer: Medicare Other | Source: Ambulatory Visit | Attending: Pulmonary Disease | Admitting: Pulmonary Disease

## 2022-02-07 ENCOUNTER — Encounter: Payer: Self-pay | Admitting: Pulmonary Disease

## 2022-02-07 VITALS — BP 130/80 | HR 80 | Temp 98.2°F | Ht 62.5 in | Wt 241.8 lb

## 2022-02-07 DIAGNOSIS — R0602 Shortness of breath: Secondary | ICD-10-CM

## 2022-02-07 DIAGNOSIS — I5189 Other ill-defined heart diseases: Secondary | ICD-10-CM | POA: Diagnosis not present

## 2022-02-07 DIAGNOSIS — J449 Chronic obstructive pulmonary disease, unspecified: Secondary | ICD-10-CM | POA: Diagnosis not present

## 2022-02-07 DIAGNOSIS — G4733 Obstructive sleep apnea (adult) (pediatric): Secondary | ICD-10-CM

## 2022-02-07 DIAGNOSIS — Z9989 Dependence on other enabling machines and devices: Secondary | ICD-10-CM

## 2022-02-07 DIAGNOSIS — Z6841 Body Mass Index (BMI) 40.0 and over, adult: Secondary | ICD-10-CM

## 2022-02-07 LAB — D-DIMER, QUANTITATIVE: D-Dimer, Quant: 0.4 ug/mL-FEU (ref 0.00–0.50)

## 2022-02-07 NOTE — Progress Notes (Signed)
Subjective:    Patient ID: Rhonda Oconnell, female    DOB: 04-26-1951, 71 y.o.   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  This is a scheduled visit. She was last seen by me on 31 December 2021 at that time she had a minor exacerbation and had to be treated with Azithromycin and Tessalon Perles. She does have sleep apnea and is compliant with her CPAP at 11 cm H2O, she brings her compliance download which is on her mobile phone I have reviewed it and she has been very compliant.  She continues to have good control of her reflux with PPI and has not had any further awakenings with reflux.  Her main complaint today is that her dyspnea appears to be getting worse.  She notes lack of stamina and increased dyspnea on exertion.  She has not had any chest pain no orthopnea or paroxysmal nocturnal dyspnea.  No lower extremity edema nor calf tenderness.  He has not had any fevers, chills or sweats.  No change in the character of her sputum.  No hemoptysis.     Review of Systems A 10 point review of systems was performed and it is as noted above otherwise negative.  Patient Active Problem List   Diagnosis Date Noted   Chronic cough 01/30/2022   Acute exacerbation of COPD with asthma ([place]) 01/16/2022   Acute rhinosinusitis 01/16/2022   Acute tracheobronchitis 01/16/2022   Dyspnea on exertion 04/29/2020   Chronic heart failure with preserved ejection fraction (HFpEF) ([place]) 04/29/2020   Laryngopharyngeal reflux (LPR) 01/04/2019   Acute recurrent sinusitis 01/04/2019   Morbid obesity ([place]) 01/02/2019   Morbid obesity with BMI of 40.0-44.9, adult ([place]) 11/24/2018   Obstructive lung disease ([place]) 09/24/2016   Varicose veins of both lower  extremities with pain 09/24/2016   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 2 packs a day at most   Substance Use Topics   Alcohol use: No   Allergies  Allergen Reactions   Iodinated Contrast Media Hives   Iodine Hives   Sulfa Antibiotics Hives   Current Meds  Medication Sig   acetaminophen (TYLENOL) 500 MG tablet Take 500 mg by mouth every 6 (six) hours as needed.   albuterol (VENTOLIN HFA) 108 (90 Base) MCG/ACT inhaler INHALE 2 PUFFS INTO THE LUNGS EVERY 6 HOURS AS NEEDED FOR WHEEZING OR SHORTNESS OF BREATH   aspirin EC 81 MG tablet Take 81 mg by mouth daily.   atorvastatin (LIPITOR) 20 MG tablet Take 20 mg by mouth daily.   azelastine (ASTELIN) 0.1 % nasal spray Place 2 sprays into both nostrils 2 (two) times daily. Use in each nostril as directed   Budeson-Glycopyrrol-Formoterol (BREZTRI AEROSPHERE) 160-9-4.8 MCG/ACT AERO Inhale 2 puffs into the  lungs in the morning and at bedtime.   Calcium Carbonate-Vitamin D (OSCAL 500/200 D-3 PO) Take 1 tablet by mouth 2 (two) times daily.   cetirizine (ZYRTEC) 10 MG tablet Take 1 tablet (10 mg total) by mouth daily.   chlorpheniramine (CHLOR-TRIMETON) 4 MG tablet Take 4 mg by mouth 2 (two) times daily as needed for allergies.   diphenhydramine-acetaminophen (TYLENOL PM) 25-500 MG TABS tablet Take 1 tablet by mouth at bedtime as needed.   fluticasone (FLONASE) 50 MCG/ACT nasal spray Place 2 sprays into both nostrils daily.   furosemide (LASIX) 20 MG tablet Take 20 mg by mouth daily as needed.   hydrochlorothiazide (HYDRODIURIL) 12.5 MG tablet  Take 1 tablet by mouth daily.   losartan (COZAAR) 100 MG tablet Take 100 mg by mouth daily.   montelukast (SINGULAIR) 10 MG tablet Take 10 mg by mouth at bedtime.   Multiple Vitamin (MULTIVITAMIN) tablet Take 1 tablet by mouth daily.   NON FORMULARY CPAP at bedtime.   omeprazole (PRILOSEC) 40 MG capsule TAKE 1 CAPSULE (40 MG TOTAL) BY MOUTH DAILY.   Spacer/Aero-Holding Chambers DEVI 1 each by Does not apply route daily.   Immunization History  Administered Date(s) Administered   Fluad Quad(high Dose 65+) 09/15/2019   Influenza Inj Mdck Quad Pf 10/01/2018   Influenza Split 10/31/2015   Influenza-Unspecified 10/20/2017, 10/02/2020   Pneumococcal Conjugate-13 08/29/2016   Pneumococcal Polysaccharide-23 03/22/2011, 10/20/2017   Zoster Recombinat (Shingrix) 01/08/2019, 05/05/2019       Objective:   Physical Exam BP 130/80 (BP Location: Left Arm, Patient Position: Sitting, Cuff Size: Normal)   Pulse 80   Temp 98.2 F (36.8 C) (Oral)   Ht 5' 2.5" (1.588 m)   Wt 241 lb 12.8 oz (109.7 kg)   SpO2 98%   BMI 43.52 kg/m  GENERAL: Obese woman, no acute respiratory distress, fully ambulatory.  No conversational dyspnea.  No hoarseness noted today. HEAD: Normocephalic, atraumatic. EYES: Pupils equal, round, reactive to light.  No scleral icterus.  Mild periorbital puffiness. MOUTH: Nose/mouth/throat not examined due to masking requirements for COVID 19. NECK: Supple. No thyromegaly. Trachea midline. No JVD.  No adenopathy. PULMONARY: Good air entry bilaterally, coarse, no other adventitious sounds. CARDIOVASCULAR: S1 and S2. Regular rate and rhythm.  Grade 2/6 systolic ejection murmur left sternal border. No change from prior.   GASTROINTESTINAL: Obese, benign. MUSCULOSKELETAL: No joint deformity, no clubbing, no edema. NEUROLOGIC: Fully ambulatory with no gait disturbance noted.  Awake, alert, speech fluent, no overt focal deficits. SKIN: Intact,warm,dry.  On limited exam no rashes. PSYCH:  Normal mood and behavior.      Assessment & Plan:     ICD-10-CM   1. SOB (shortness of breath)  R06.02 D-dimer, quantitative    Pulmonary Function Test [facility] Only    ECHOCARDIOGRAM COMPLETE   Worsening over baseline Check D-dimer If D-dimer positive then CT angio Echocardiogram, PFTs, overnight oximetry    2. Asthma-COPD overlap syndrome (HCC)  J44.9 Pulmonary Function Test [facility] Only    ECHOCARDIOGRAM COMPLETE   Continue Breztri 2 puffs twice a day Continue as needed albuterol    3. Grade II diastolic dysfunction  [id number]    This issue adds complexity to her management Echocardiogram to reassess May be adding to issues with dyspnea    4. OSA on CPAP  G47.33 Pulse oximetry, overnight   Z99.89    Continue CPAP Will check overnight oximetry on CPAP    5. Morbid obesity with BMI of 40.0-44.9, adult ([place])  E66.01  Z68.41      Orders Placed This Encounter  Procedures   D-dimer, quantitative    Standing Status:   Future    Number of Occurrences:   1    Standing Expiration Date:   02/08/2023   Pulmonary Function Test [facility] Only    Standing Status:   Future    Number of Occurrences:   1    Standing Expiration Date:   02/08/2023    Order Specific Question:   Full PFT: includes the following: basic spirometry, spirometry pre & post bronchodilator, diffusion capacity (DLCO), lung volumes    Answer:   Full PFT    Order Specific Question:   This test can only be performed at    Answer:   [facility]   Pulse oximetry, overnight    Standing Status:   Future    Standing Expiration Date:   02/08/2023   ECHOCARDIOGRAM COMPLETE    Standing Status:   Future    Number of Occurrences:   1    Standing Expiration Date:   08/10/2022    Order Specific Question:   Where should this test be performed    Answer:   [facility]    Order Specific Question:   Please indicate who you request to read the nuc med / echo results.    Answer:   [facility]    Order Specific Question:    Perflutren DEFINITY (image enhancing agent) should be administered unless hypersensitivity or allergy exist    Answer:   Administer Perflutren    Order Specific Question:   Reason for exam-Echo    Answer:   SBE I33.9   We will see the patient in follow-up in 4 to 6 weeks time she is to call sooner should any new problems arise.  Renold Don, MD Advanced Bronchoscopy PCCM [facility]    *This note was dictated using voice recognition software/Dragon.  Despite best efforts to proofread, errors can occur which can change the meaning. Any transcriptional errors that result from this process are unintentional and may not be fully corrected at the time of dictation.

## 2022-02-07 NOTE — Patient Instructions (Signed)
We are going to check a blood test to see if we need to evaluate for blood clots in your lung. ? ?We are going to repeat breathing tests and an echocardiogram. ? ?We will see him in follow-up in 4 to 6 weeks time call sooner should any new problems arise. ?

## 2022-02-25 ENCOUNTER — Telehealth: Payer: Self-pay | Admitting: Pulmonary Disease

## 2022-02-25 DIAGNOSIS — J449 Chronic obstructive pulmonary disease, unspecified: Secondary | ICD-10-CM

## 2022-02-25 NOTE — Telephone Encounter (Signed)
Spoke to patient.  ?She is requesting ONO results.  ?Results have not been received ONO. Spoke to [place] and requested results.  ? ? ?

## 2022-02-25 NOTE — Telephone Encounter (Signed)
ONO received and placed in Dr. Domingo Dimes folder for review.  ? ?

## 2022-02-25 NOTE — Telephone Encounter (Signed)
ONO reviewed by Dr. Jayme Cloud-- if ONO was performed on cpap then patient would need 1L bled in cpap and repeat ONO with oxygen and cpap. ?  ?Spoke to patient and confirmed that she wore cpap night of study. She is concerned that test is not accurate because she had a sinus infection the time of test.  ?She would like to repeat ONO. ? ?Dr. Jayme Cloud, please advise. Thanks ?

## 2022-02-27 NOTE — Telephone Encounter (Signed)
Patient is aware of below message and voiced her understanding.  ?Nothing further needed at this time. ? ? ?Rhonda Oconnell, please reschedule PFT. Thanks ?

## 2022-02-27 NOTE — Telephone Encounter (Signed)
Spoke to patient and relayed below message. She voiced her understanding.  ?She is concerned about doing PFT on 03/02/2022 with current sinus issues.  ?C/o headache, voice hoariness and non prod cough x3d ?Sob is baseline ?Denied f/c/s or additional sx.   ?Using albuterol HFA BID and Breztri BID.  ?No recent covid or flu test.  ?Not vaccinated against covid. No flu shot.  ? ?Dr. Patsey Berthold, please advise. Thanks ?

## 2022-02-27 NOTE — Telephone Encounter (Signed)
She can reschedule PFTs.  I do recommend she see ENT she needs to get more control of her sinus issues.  This is what triggers everything else. ?

## 2022-02-27 NOTE — Telephone Encounter (Signed)
We can repeat testing but a sinus infection should not affect her oxygen saturation. ?

## 2022-02-28 ENCOUNTER — Other Ambulatory Visit: Payer: Medicare Other

## 2022-02-28 NOTE — Telephone Encounter (Signed)
I CXL the patient's PFT appt but we are waiting for April PFT appts to get her rescheduled ?

## 2022-03-01 ENCOUNTER — Ambulatory Visit: Payer: Medicare Other

## 2022-03-01 ENCOUNTER — Ambulatory Visit (INDEPENDENT_AMBULATORY_CARE_PROVIDER_SITE_OTHER): Payer: Medicare Other

## 2022-03-01 ENCOUNTER — Other Ambulatory Visit: Payer: Self-pay

## 2022-03-01 DIAGNOSIS — R0602 Shortness of breath: Secondary | ICD-10-CM

## 2022-03-01 DIAGNOSIS — J449 Chronic obstructive pulmonary disease, unspecified: Secondary | ICD-10-CM

## 2022-03-01 LAB — ECHOCARDIOGRAM COMPLETE
AR max vel: 3.03 cm2
AV Area VTI: 3.31 cm2
AV Area mean vel: 3.35 cm2
AV Mean grad: 4 mmHg
AV Peak grad: 8.4 mmHg
Ao pk vel: 1.45 m/s
Area-P 1/2: 3.39 cm2
Calc EF: 57.7 %
S' Lateral: 3.1 cm
Single Plane A2C EF: 53.4 %
Single Plane A4C EF: 62.3 %

## 2022-03-11 NOTE — Telephone Encounter (Signed)
We finally got PFT appts for April I have left a message for Rhonda Oconnell to call and reschedule her PFT before her appt with Dr. Jayme Cloud on 03/21/22 ?

## 2022-03-11 NOTE — Telephone Encounter (Signed)
I spoke with Rhonda Oconnell and her PFT has been rescheduled on 03/19/22 @ 1:00pm  ?

## 2022-03-14 LAB — PULMONARY FUNCTION TEST

## 2022-03-19 ENCOUNTER — Ambulatory Visit: Payer: Medicare Other

## 2022-03-19 ENCOUNTER — Encounter: Payer: Self-pay | Admitting: Pulmonary Disease

## 2022-03-21 ENCOUNTER — Encounter: Payer: Self-pay | Admitting: Pulmonary Disease

## 2022-03-21 ENCOUNTER — Ambulatory Visit (INDEPENDENT_AMBULATORY_CARE_PROVIDER_SITE_OTHER): Payer: Medicare Other | Admitting: Pulmonary Disease

## 2022-03-21 VITALS — BP 120/70 | HR 68 | Temp 98.1°F | Ht 63.0 in | Wt 240.6 lb

## 2022-03-21 DIAGNOSIS — Z6841 Body Mass Index (BMI) 40.0 and over, adult: Secondary | ICD-10-CM

## 2022-03-21 DIAGNOSIS — I5189 Other ill-defined heart diseases: Secondary | ICD-10-CM

## 2022-03-21 DIAGNOSIS — G4733 Obstructive sleep apnea (adult) (pediatric): Secondary | ICD-10-CM

## 2022-03-21 DIAGNOSIS — Z9989 Dependence on other enabling machines and devices: Secondary | ICD-10-CM

## 2022-03-21 DIAGNOSIS — J4489 Other specified chronic obstructive pulmonary disease: Secondary | ICD-10-CM

## 2022-03-21 DIAGNOSIS — J449 Chronic obstructive pulmonary disease, unspecified: Secondary | ICD-10-CM

## 2022-03-21 DIAGNOSIS — R0602 Shortness of breath: Secondary | ICD-10-CM

## 2022-03-21 NOTE — Progress Notes (Signed)
Subjective:    Patient ID: Rhonda Oconnell, female    DOB: 12-Jan-1951, 71 y.o.   MRN: [id number] Patient Care Team: Kandyce Rud, MD as PCP - General (Family Medicine) End, Cristal Deer, MD as PCP - Cardiology (Cardiology) Salena Saner, MD as Consulting Physician (Pulmonary Disease)  Chief Complaint  Patient presents with   Follow-up    Still getting sob, notices it when she takes alive and ibuprofen.  She did not take any yesterday or today and has not noticed it.   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  This is a scheduled visit. She was last seen by me on 07 February 2022 at that time she was seen due to worsening dyspnea.  She now tells me that she has noted that dyspnea is worse when she takes Aleve or ibuprofen.  As noted she does have an asthmatic component to her COPD.  She states that she has held off on taking the Aleve and ibuprofen and noted that her dyspnea improved.  At her prior visit we ordered an echocardiogram which was performed 01 March 2022 this shows that she continues to have issues with diastolic dysfunction.  Her LVEF is preserved at 60 to 65%.  Unchanged from prior.  Pete PFTs are still pending.  She does have sleep apnea and is compliant with her CPAP at 11 cm H2O, she brings her compliance download which is on her mobile phone I have reviewed it and she has been very compliant.  She continues to have good control of her reflux with PPI and has not had any further awakenings with reflux.    She has not had any chest pain no orthopnea or paroxysmal nocturnal dyspnea.  No lower extremity edema nor calf tenderness.  He has not had any fevers, chills or sweats.  No change in the character of her sputum.  No hemoptysis.    Review of Systems A 10 point review of systems was performed and it is as noted above otherwise negative.  Patient Active Problem List   Diagnosis Date Noted   Chronic cough  01/30/2022   Acute exacerbation of COPD with asthma (HCC) 01/16/2022   Acute rhinosinusitis 01/16/2022   Acute tracheobronchitis 01/16/2022   Dyspnea on exertion 04/29/2020   Chronic heart failure with preserved ejection fraction (HFpEF) (HCC) 04/29/2020   Laryngopharyngeal reflux (LPR) 01/04/2019   Acute recurrent sinusitis 01/04/2019   Morbid obesity (HCC) 01/02/2019   Morbid obesity with BMI of 40.0-44.9, adult (HCC) 11/24/2018   Obstructive lung disease (HCC) 09/24/2016   Varicose veins of both lower extremities with pain 09/24/2016   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 2 packs a day at most   Substance Use Topics   Alcohol use: No   Allergies  Allergen Reactions   Iodinated Contrast Media Hives   Iodine Hives   Sulfa Antibiotics Hives   Current Meds  Medication Sig   acetaminophen (TYLENOL) 500 MG tablet  Take 500 mg by mouth every 6 (six) hours as needed.   albuterol (VENTOLIN HFA) 108 (90 Base) MCG/ACT inhaler INHALE 2 PUFFS INTO THE LUNGS EVERY 6 HOURS AS NEEDED FOR WHEEZING OR SHORTNESS OF BREATH   aspirin EC 81 MG tablet Take 81 mg by mouth daily.   atorvastatin (LIPITOR) 20 MG tablet Take 20 mg by mouth daily.   azelastine (ASTELIN) 0.1 % nasal spray Place 2 sprays into both nostrils 2 (two) times daily. Use in each nostril as directed   Budeson-Glycopyrrol-Formoterol (BREZTRI AEROSPHERE) 160-9-4.8 MCG/ACT AERO Inhale 2 puffs into the lungs in the morning and at bedtime.   Calcium Carbonate-Vitamin D (OSCAL 500/200 D-3 PO) Take 1 tablet by mouth 2 (two) times daily.    cetirizine (ZYRTEC) 10 MG tablet Take 1 tablet (10 mg total) by mouth daily.   chlorpheniramine (CHLOR-TRIMETON) 4 MG tablet Take 4 mg by mouth 2 (two) times daily as needed for allergies.   diphenhydramine-acetaminophen (TYLENOL PM) 25-500 MG TABS tablet Take 1 tablet by mouth at bedtime as needed.   furosemide (LASIX) 20 MG tablet Take 20 mg by mouth daily as needed.   hydrochlorothiazide (HYDRODIURIL) 12.5 MG tablet Take 1 tablet by mouth daily.   losartan (COZAAR) 100 MG tablet Take 100 mg by mouth daily.   montelukast (SINGULAIR) 10 MG tablet Take 10 mg by mouth at bedtime.   Multiple Vitamin (MULTIVITAMIN) tablet Take 1 tablet by mouth daily.   NON FORMULARY CPAP at bedtime.   omeprazole (PRILOSEC) 40 MG capsule TAKE 1 CAPSULE (40 MG TOTAL) BY MOUTH DAILY.   Spacer/Aero-Holding Chambers DEVI 1 each by Does not apply route daily.   Immunization History  Administered Date(s) Administered   Fluad Quad(high Dose 65+) 09/15/2019, 03/07/2022   Influenza Inj Mdck Quad Pf 10/01/2018   Influenza Split 10/31/2015   Influenza-Unspecified 10/20/2017, 10/02/2020   Pneumococcal Conjugate-13 08/29/2016   Pneumococcal Polysaccharide-23 03/22/2011, 10/20/2017   Zoster Recombinat (Shingrix) 01/08/2019, 05/05/2019       Objective:   Physical Exam BP 120/70 (BP Location: Right Arm, Patient Position: Sitting, Cuff Size: Large)   Pulse 68   Temp 98.1 F (36.7 C) (Oral)   Ht  (1.6 m)   Wt 240 lb 9.6 oz (109.1 kg)   SpO2 98%   BMI 42.62 kg/m  GENERAL: Obese woman, no acute respiratory distress, fully ambulatory.  No conversational dyspnea.  No hoarseness noted today. HEAD: Normocephalic, atraumatic. EYES: Pupils equal, round, reactive to light.  No scleral icterus.  Mild periorbital puffiness. MOUTH: Nose/mouth/throat not examined due to masking requirements for COVID 19. NECK: Supple. No thyromegaly. Trachea midline. No JVD.  No adenopathy. PULMONARY: Good air entry bilaterally,  coarse, no other adventitious sounds. CARDIOVASCULAR: S1 and S2. Regular rate and rhythm.  Grade 2/6 systolic ejection murmur left sternal border. No change from prior.   GASTROINTESTINAL: Obese, benign. MUSCULOSKELETAL: No joint deformity, no clubbing, no edema. NEUROLOGIC: Fully ambulatory with no gait disturbance noted.  Awake, alert, speech fluent, no overt focal deficits. SKIN: Intact,warm,dry.  On limited exam no rashes. PSYCH: Normal mood and behavior.      Assessment & Plan:     ICD-10-CM   1. Asthma-COPD overlap syndrome (HCC)  J44.9    Continue Breztri 2 puffs twice a day Continue as needed albuterol    2. SOB (shortness of breath)  R06.02    Notes relationship to taking nonsteroidals Avoid nonsteroidals Discontinue aspirin PFTs pending    3. Grade II diastolic dysfunction  I51.89  Weight loss and good BP maintenance recommended Stay active    4. OSA on CPAP  G47.33    Z99.89    Compliant with CPAP at 11 cm H2O Continue CPAP    5. Morbid obesity with BMI of 40.0-44.9, adult (HCC)  E66.01    Z68.41    Weight loss recommended     Patient was instructed to avoid all nonsteroidals.  Was have a history of asthma and she may have sensitivities to these medications.  It appears that her shortness of breath is worse on these medications.  We will see her in follow-up in 3 months time she is to call sooner should any new problems arise.  Gailen Shelter, MD Advanced Bronchoscopy PCCM [facility]    *This note was dictated using voice recognition software/Dragon.  Despite best efforts to proofread, errors can occur which can change the meaning. Any transcriptional errors that result from this process are unintentional and may not be fully corrected at the time of dictation.

## 2022-03-21 NOTE — Patient Instructions (Signed)
I will call you with the results of the overnight oximetry once I have those available. ? ?Hold off on using any ibuprofen, Aleve or aspirin or similar products.  For pain you may use Tylenol only. ? ?Continue using Breztri 2 puffs twice a day. ? ?Your breathing test will be rescheduled once the part that is necessary for the machine is replaced. ? ?Your echocardiogram was pretty much unchanged from the one prior.  Weight loss and good blood pressure control as well as staying active are good things for your heart. ? ?We will see you in follow-up in 3 months time call sooner should any new problems arise. ?

## 2022-03-27 ENCOUNTER — Telehealth: Payer: Self-pay

## 2022-03-27 NOTE — Telephone Encounter (Signed)
Spoke to patient and relayed below message/recommendations.  ?She stated that she does not wish to proceed with oxygen at this time, as she feels she does okay most of the time. ? ?Routing to Dr. Jayme Cloud to make aware.  ? ?

## 2022-03-27 NOTE — Telephone Encounter (Signed)
ONO reviewed by Dr. Fredrich Romans 1L bled in to cpap.  ? ?Lm for patient.  ?

## 2022-04-08 ENCOUNTER — Encounter: Payer: Self-pay | Admitting: Pulmonary Disease

## 2022-04-08 NOTE — Telephone Encounter (Signed)
Dr. Gonzalez, please advise. Thanks 

## 2022-04-08 NOTE — Telephone Encounter (Signed)
The low oxygen levels have to do with her COPD, her sleep apnea and obesity decreased lung volumes.  Her COPD medications seem adequate however there is a portion of COPD that has to do with the small sacs that are in the lung called alveoli.  This continues to deteriorate slowly over time and not affected by inhalers.  It is just a combination of several factors.  Weight loss would really help her with increasing the need for supplemental oxygen. ?

## 2022-06-06 ENCOUNTER — Ambulatory Visit: Payer: Medicare Other | Attending: Pulmonary Disease

## 2022-06-06 DIAGNOSIS — Z7951 Long term (current) use of inhaled steroids: Secondary | ICD-10-CM | POA: Diagnosis not present

## 2022-06-06 DIAGNOSIS — J449 Chronic obstructive pulmonary disease, unspecified: Secondary | ICD-10-CM | POA: Diagnosis not present

## 2022-06-06 DIAGNOSIS — Z87891 Personal history of nicotine dependence: Secondary | ICD-10-CM | POA: Diagnosis not present

## 2022-06-06 DIAGNOSIS — R0602 Shortness of breath: Secondary | ICD-10-CM | POA: Diagnosis present

## 2022-06-06 LAB — PULMONARY FUNCTION TEST ARMC ONLY
DL/VA % pred: 83 %
DL/VA: 3.53 ml/min/mmHg/L
DLCO unc % pred: 82 %
DLCO unc: 15.12 ml/min/mmHg
FEF 25-75 Post: 0.88 L/sec
FEF 25-75 Pre: 0.74 L/sec
FEF2575-%Change-Post: 18 %
FEF2575-%Pred-Post: 47 %
FEF2575-%Pred-Pre: 40 %
FEV1-%Change-Post: 6 %
FEV1-%Pred-Post: 69 %
FEV1-%Pred-Pre: 65 %
FEV1-Post: 1.47 L
FEV1-Pre: 1.39 L
FEV1FVC-%Change-Post: 2 %
FEV1FVC-%Pred-Pre: 80 %
FEV6-%Change-Post: 4 %
FEV6-%Pred-Post: 86 %
FEV6-%Pred-Pre: 83 %
FEV6-Post: 2.32 L
FEV6-Pre: 2.22 L
FEV6FVC-%Change-Post: 0 %
FEV6FVC-%Pred-Post: 103 %
FEV6FVC-%Pred-Pre: 103 %
FVC-%Change-Post: 3 %
FVC-%Pred-Post: 83 %
FVC-%Pred-Pre: 80 %
FVC-Post: 2.34 L
FVC-Pre: 2.25 L
Post FEV1/FVC ratio: 63 %
Post FEV6/FVC ratio: 99 %
Pre FEV1/FVC ratio: 62 %
Pre FEV6/FVC Ratio: 99 %
RV % pred: 101 %
RV: 2.09 L
TLC % pred: 97 %
TLC: 4.64 L

## 2022-06-20 ENCOUNTER — Ambulatory Visit (INDEPENDENT_AMBULATORY_CARE_PROVIDER_SITE_OTHER): Payer: Medicare Other | Admitting: Pulmonary Disease

## 2022-06-20 ENCOUNTER — Encounter: Payer: Self-pay | Admitting: Pulmonary Disease

## 2022-06-20 VITALS — BP 126/74 | HR 93 | Temp 97.9°F | Ht 62.0 in | Wt 237.4 lb

## 2022-06-20 DIAGNOSIS — K219 Gastro-esophageal reflux disease without esophagitis: Secondary | ICD-10-CM

## 2022-06-20 DIAGNOSIS — R053 Chronic cough: Secondary | ICD-10-CM | POA: Diagnosis not present

## 2022-06-20 DIAGNOSIS — Z6841 Body Mass Index (BMI) 40.0 and over, adult: Secondary | ICD-10-CM

## 2022-06-20 DIAGNOSIS — G4733 Obstructive sleep apnea (adult) (pediatric): Secondary | ICD-10-CM

## 2022-06-20 DIAGNOSIS — Z9989 Dependence on other enabling machines and devices: Secondary | ICD-10-CM

## 2022-06-20 DIAGNOSIS — J449 Chronic obstructive pulmonary disease, unspecified: Secondary | ICD-10-CM

## 2022-06-20 DIAGNOSIS — I5189 Other ill-defined heart diseases: Secondary | ICD-10-CM

## 2022-06-20 NOTE — Patient Instructions (Signed)
Continue taking your Breztri 2 puffs twice a day.  Make sure you rinse your mouth well after you use it.  You may gargle with water with a small amount of baking soda in it after you use it, to see if that helps your hoarseness.  For your reflux switch to taking your omeprazole (Prilosec) in the morning and then take Pepcid over-the-counter, 1 tablet at nighttime.  Losartan can still have cough side effects.  All of the same medication class is medication called Benicar (olmesartan) which may work well for you and is the only 1 in his class that does not have the side effect of cough.   We will consider repeating your overnight oximetry on the CPAP in the future.  We will see you in follow-up in 3 to 4 months time call sooner should any new problems arise.

## 2022-06-20 NOTE — Progress Notes (Signed)
Subjective:    Patient ID: Rhonda Oconnell, female    DOB: [birth year], 71 y.o.   MRN: [id number] Patient Care Team: Kandyce Rud, Rhonda Oconnell as PCP - General (Family Medicine) End, Cristal Deer, Rhonda Oconnell as PCP - Cardiology (Cardiology)  Chief Complaint  Patient presents with   Follow-up    Wearing cpap nightly-pressure and mask is okay. C/o non prod cough and occ wheezing.    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  This is a scheduled visit.  She was last seen on 21 March 2022 at that time was noted to have persistent issues with increasing dyspnea over her baseline and recurrence of her chronic cough.  Previously she had resolved her issues with cough but these have returned.  She had repeat echocardiogram, PFTs, overnight oximetry and recommendations that losartan be switched to a different antihypertensive medication.  She does have sleep apnea and is compliant with her CPAP at 11 cm H2O, compliance report was checked today and she is 100% compliant.  Her apneic events are completely controlled.  Overnight oximetry on room air did show that she did desaturate to 88% even with the CPAP however the patient did not want to pursue additional oxygen bled into the mask.  Previously she had issues with nocturnal awakenings but these corrected after after having her PPI dose adjusted.  The episodes were consistent with reflux.  She had a recent ENT visit that did show that she has significant vocal cord irritation.  I suspect that she is still having issues with reflux.   It appears that her blood pressure medication was switched from losartan to spironolactone but however her pressure was not controlled so she resumed the losartan because she was afraid of having complications from elevated blood pressure.  She did note however that while she was off of the losartan for approximately a week her cough was actually getting better.   Cough is now back at baseline now that she has resumed the medication.  She does not endorse any fevers, chills or sweats.  She has not had significant sputum production.  No hemoptysis.  No other symptomatology endorsed today.   Review of Systems A 10 point review of systems was performed and it is as noted above otherwise negative.  Patient Active Problem List   Diagnosis Date Noted   Chronic cough 01/30/2022   Acute exacerbation of COPD with asthma (HCC) 01/16/2022   Acute rhinosinusitis 01/16/2022   Acute tracheobronchitis 01/16/2022   Dyspnea on exertion 04/29/2020   Chronic heart failure with preserved ejection fraction (HFpEF) (HCC) 04/29/2020   Laryngopharyngeal reflux (LPR) 01/04/2019   Acute recurrent sinusitis 01/04/2019   Morbid obesity (HCC) 01/02/2019   Morbid obesity with BMI of 40.0-44.9, adult (HCC) 11/24/2018   Obstructive lung disease (HCC) 09/24/2016   Varicose veins of both lower extremities with pain 09/24/2016   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 2 packs a day  at most   Substance Use Topics   Alcohol use: No   Allergies  Allergen Reactions   Iodinated Contrast Media Hives   Iodine Hives   Sulfa Antibiotics Hives   Current Meds  Medication Sig   acetaminophen (TYLENOL) 500 MG tablet Take 500 mg by mouth every 6 (six) hours as needed.   albuterol (VENTOLIN HFA) 108 (90 Base) MCG/ACT inhaler INHALE 2 PUFFS INTO THE LUNGS EVERY 6 HOURS AS NEEDED FOR WHEEZING OR SHORTNESS OF BREATH   aspirin EC 81 MG tablet Take 81 mg by mouth daily.    atorvastatin (LIPITOR) 20 MG tablet Take 20 mg by mouth daily.   azelastine (ASTELIN) 0.1 % nasal spray Place 2 sprays into both nostrils 2 (two) times daily. Use in each nostril as directed   Budeson-Glycopyrrol-Formoterol (BREZTRI AEROSPHERE) 160-9-4.8 MCG/ACT AERO Inhale 2 puffs into the lungs in the morning and at bedtime.   Calcium Carbonate-Vitamin D (OSCAL 500/200 D-3 PO) Take 1 tablet by mouth 2 (two) times daily.   cetirizine (ZYRTEC) 10 MG tablet Take 1 tablet (10 mg total) by mouth daily.   chlorpheniramine (CHLOR-TRIMETON) 4 MG tablet Take 4 mg by mouth 2 (two) times daily as needed for allergies.   diphenhydramine-acetaminophen (TYLENOL PM) 25-500 MG TABS tablet Take 1 tablet by mouth at bedtime as needed.   furosemide (LASIX) 20 MG tablet Take 20 mg by mouth daily as needed.   hydrochlorothiazide (HYDRODIURIL) 12.5 MG tablet Take 1 tablet by mouth daily.   losartan (COZAAR) 100 MG tablet Take 100 mg by mouth daily.   montelukast (SINGULAIR) 10 MG tablet Take 10 mg by mouth at bedtime.   Multiple Vitamin (MULTIVITAMIN) tablet Take 1 tablet by mouth daily.   NON FORMULARY CPAP at bedtime.   omeprazole (PRILOSEC) 40 MG capsule TAKE 1 CAPSULE (40 MG TOTAL) BY MOUTH DAILY.   Spacer/Aero-Holding Chambers DEVI 1 each by Does not apply route daily.   Immunization History  Administered Date(s) Administered   Fluad Quad(high Dose 65+) 09/15/2019, 03/07/2022   Influenza Inj Mdck Quad Pf 10/01/2018   Influenza Split 10/31/2015   Influenza-Unspecified 10/20/2017, 10/02/2020   Pneumococcal Conjugate-13 08/29/2016   Pneumococcal Polysaccharide-23 03/22/2011, 10/20/2017   Zoster Recombinat (Shingrix) 01/08/2019, 05/05/2019   Immunization History  Administered Date(s) Administered   Fluad Quad(high Dose 65+) 09/15/2019, 03/07/2022   Influenza Inj Mdck Quad Pf 10/01/2018   Influenza Split 10/31/2015   Influenza-Unspecified 10/20/2017, 10/02/2020   Pneumococcal Conjugate-13 08/29/2016    Pneumococcal Polysaccharide-23 03/22/2011, 10/20/2017   Zoster Recombinat (Shingrix) 01/08/2019, 05/05/2019      Objective:   Physical Exam BP 126/74 (BP Location: Left Arm, Cuff Size: Large)   Pulse 93   Temp 97.9 F (36.6 C) (Temporal)   Ht 5\' 2"  (1.575 m)   Wt 237 lb 6.4 oz (107.7 kg)   SpO2 95%   BMI 43.42 kg/m  GENERAL: Obese woman, no acute respiratory distress, fully ambulatory.  No conversational dyspnea.  No hoarseness noted today. HEAD: Normocephalic, atraumatic. EYES: Pupils equal, round, reactive to light.  No scleral icterus.  Mild periorbital puffiness. MOUTH: Nose/mouth/throat not examined due to masking requirements for COVID 19. NECK: Supple. No thyromegaly. Trachea midline. No JVD.  No adenopathy. PULMONARY: Good air entry bilaterally, coarse, no other adventitious sounds. CARDIOVASCULAR: S1 and S2. Regular rate and rhythm.  Grade 2/6 systolic ejection murmur left sternal border. No change from prior.   GASTROINTESTINAL: Obese, benign. MUSCULOSKELETAL: No joint deformity, no clubbing, no edema. NEUROLOGIC: Fully ambulatory with no  gait disturbance noted.  Awake, alert, speech fluent, no overt focal deficits. SKIN: Intact,warm,dry.  On limited exam no rashes. PSYCH: Normal mood and behavior.     Assessment & Plan:     ICD-10-CM   1. Chronic cough  R05.3    Multifactorial Postnasal drip/GERD/medication effect Consider switching losartan to olmesartan Olmesartan has no cough side effect in class    2. Asthma-COPD overlap syndrome (HCC)  J44.9    Continue Breztri 2 puffs twice a day Rinse well after use     3. Laryngopharyngeal reflux (LPR)  K21.9    Take omeprazole in the morning Pepcid 20 mg at bedtime Antireflux measures    4. Grade II diastolic dysfunction  I51.89    This issue adds complexity to her management Followed by cardiology    5. OSA on CPAP  G47.33    Z99.89    Continue CPAP at 11 cm of water pressure Reconsider overnight oximetry on  CPAP    6. Morbid obesity with BMI of 40.0-44.9, adult (HCC)  E66.01    Z68.41    Weight loss recommended     We will see the patient in follow-up in 3 to 4 months time she is to contact us prior to that time should any new difficulties arise.  Rhonda Shelter, Rhonda Oconnell Advanced Bronchoscopy PCCM [facility]    *This note was dictated using voice recognition software/Dragon.  Despite best efforts to proofread, errors can occur which can change the meaning. Any transcriptional errors that result from this process are unintentional and may not be fully corrected at the time of dictation.

## 2022-06-26 ENCOUNTER — Encounter: Payer: Self-pay | Admitting: Pulmonary Disease

## 2022-06-26 NOTE — Telephone Encounter (Signed)
Dr. Kasa, please advise. Dr. Gonzalez is unavailable.  ° °

## 2022-07-12 ENCOUNTER — Other Ambulatory Visit: Payer: Self-pay | Admitting: Pulmonary Disease

## 2022-09-26 ENCOUNTER — Ambulatory Visit: Payer: Medicare Other | Admitting: Pulmonary Disease

## 2022-10-08 ENCOUNTER — Encounter: Payer: Self-pay | Admitting: Pulmonary Disease

## 2022-10-22 ENCOUNTER — Encounter: Payer: Self-pay | Admitting: Pulmonary Disease

## 2022-10-22 ENCOUNTER — Ambulatory Visit (INDEPENDENT_AMBULATORY_CARE_PROVIDER_SITE_OTHER): Payer: Medicare Other | Admitting: Pulmonary Disease

## 2022-10-22 VITALS — BP 124/78 | HR 69 | Temp 97.7°F | Ht 62.0 in | Wt 234.4 lb

## 2022-10-22 DIAGNOSIS — J4489 Other specified chronic obstructive pulmonary disease: Secondary | ICD-10-CM | POA: Diagnosis not present

## 2022-10-22 DIAGNOSIS — G4733 Obstructive sleep apnea (adult) (pediatric): Secondary | ICD-10-CM | POA: Diagnosis not present

## 2022-10-22 DIAGNOSIS — Z6841 Body Mass Index (BMI) 40.0 and over, adult: Secondary | ICD-10-CM

## 2022-10-22 DIAGNOSIS — K219 Gastro-esophageal reflux disease without esophagitis: Secondary | ICD-10-CM

## 2022-10-22 DIAGNOSIS — Z23 Encounter for immunization: Secondary | ICD-10-CM | POA: Diagnosis not present

## 2022-10-22 DIAGNOSIS — R053 Chronic cough: Secondary | ICD-10-CM | POA: Diagnosis not present

## 2022-10-22 NOTE — Progress Notes (Signed)
Subjective:    Patient ID: Rhonda Oconnell, female    DOB: [birth year], 71 y.o.   MRN: [id number] Patient Care Team: Kandyce Rud, MD as PCP - General (Family Medicine) End, Cristal Deer, MD as PCP - Cardiology (Cardiology)  Chief Complaint  Patient presents with   Follow-up    Asthma-COPD. Chronic dry cough. SOB and wheezing with exertion.   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  This is a scheduled visit.  She was last seen on 20 June 2022 at that time was noted to have persistent issues with her chronic cough.  She was switched to Benicar from losartan (approximately 10% of patients have cough with ARB's if they have experience cough with ACEs previously). Of the ARB's Benicar is the one without side effect of cough.  Able to tolerate the Benicar however and went back to losartan.  The cough is mostly in the mornings and when she is in public places such as church.  There is no sputum production or hemoptysis.  She does have issues with gastroesophageal reflux states she feels these are controlled.  She does have sleep apnea and is compliant with her CPAP at 11 cm H2O, compliance report was checked today and she is 100% compliant.  Her apneic events are completely controlled.  Overnight oximetry on room air did show that she did desaturate to 88% even with the CPAP however the patient did not want to pursue additional oxygen bled into the mask.  Previously she had issues with nocturnal awakenings but these corrected after after having her PPI dose adjusted.  The episodes were consistent with reflux.  She had a recent ENT visit that did show that she has significant vocal cord irritation.  I suspect that she is still having issues with reflux.   It appears that her blood pressure medication was switched from losartan to spironolactone but however her pressure was not controlled so she resumed the losartan because  she was afraid of having complications from elevated blood pressure.  She did note however that while she was off of the losartan for approximately a week her cough was actually getting better.  Cough is now back at baseline now that she has resumed the medication.  She does not endorse any fevers, chills or sweats.  She has not had significant sputum production.  No hemoptysis.  No other symptomatology endorsed today.  She requests flu vaccine today.   Review of Systems A 10 point review of systems was performed and it is as noted above otherwise negative.  Patient Active Problem List   Diagnosis Date Noted   Chronic cough 01/30/2022   Acute exacerbation of COPD with asthma (HCC) 01/16/2022   Acute rhinosinusitis 01/16/2022   Acute tracheobronchitis 01/16/2022   Dyspnea on exertion 04/29/2020   Chronic heart failure with preserved ejection fraction (HFpEF) (HCC) 04/29/2020   Laryngopharyngeal reflux (LPR) 01/04/2019   Acute recurrent sinusitis 01/04/2019   Morbid obesity (HCC) 01/02/2019   Morbid obesity with BMI of 40.0-44.9, adult (HCC) 11/24/2018   Obstructive lung disease (HCC) 09/24/2016   Varicose veins of both lower extremities with pain 09/24/2016   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 2 packs a day at most   Substance Use Topics   Alcohol use: No   Allergies  Allergen Reactions   Iodinated Contrast Media Hives   Iodine Hives   Sulfa Antibiotics Hives   Current Meds  Medication Sig   acetaminophen (TYLENOL) 500 MG tablet Take 500 mg by mouth every 6 (six)  hours as needed.   albuterol (VENTOLIN HFA) 108 (90 Base) MCG/ACT inhaler INHALE 2 PUFFS INTO THE LUNGS EVERY 6 HOURS AS NEEDED FOR WHEEZING OR SHORTNESS OF BREATH   aspirin EC 81 MG tablet Take 81 mg by mouth daily.   atorvastatin (LIPITOR) 20 MG tablet Take 20 mg by mouth daily.   azelastine (ASTELIN) 0.1 % nasal spray Place 2 sprays into both nostrils 2 (two) times daily. Use in each nostril as directed   Budeson-Glycopyrrol-Formoterol (BREZTRI AEROSPHERE) 160-9-4.8 MCG/ACT AERO Inhale 2 puffs into the lungs in the morning and at bedtime.   Calcium Carbonate-Vitamin D (OSCAL 500/200 D-3 PO) Take 1 tablet by mouth 2 (two) times daily.   cetirizine (ZYRTEC) 10 MG tablet Take 1 tablet (10 mg total) by mouth daily.   chlorpheniramine (CHLOR-TRIMETON) 4 MG tablet Take 4 mg by mouth 2 (two) times daily as needed for allergies.   diphenhydramine-acetaminophen (TYLENOL PM) 25-500 MG TABS tablet Take 1 tablet by mouth at bedtime as needed.   furosemide (LASIX) 20 MG tablet Take 20 mg by mouth daily as needed.   hydrochlorothiazide (HYDRODIURIL) 12.5 MG tablet Take 1 tablet by mouth daily.   losartan (COZAAR) 100 MG tablet Take 100 mg by mouth daily.   meloxicam (MOBIC) 15 MG tablet Take 7.5 mg by mouth at bedtime.   montelukast (SINGULAIR) 10 MG tablet Take 10 mg by mouth at bedtime.   Multiple Vitamin (MULTIVITAMIN) tablet Take 1 tablet by mouth daily.   NON FORMULARY CPAP at bedtime.   omeprazole (PRILOSEC) 40 MG capsule TAKE 1 CAPSULE (40 MG TOTAL) BY MOUTH DAILY.   Spacer/Aero-Holding Chambers DEVI 1 each by Does not apply route daily.   Immunization History  Administered Date(s) Administered   Fluad Quad(high Dose 65+) 09/15/2019, 03/07/2022   Influenza Inj Mdck Quad Pf 10/01/2018   Influenza Split 10/31/2015   Influenza-Unspecified 10/20/2017, 10/02/2020   Pneumococcal Conjugate-13 08/29/2016   Pneumococcal Polysaccharide-23 03/22/2011, 10/20/2017   Zoster Recombinat (Shingrix)  01/08/2019, 05/05/2019       Objective:   Physical Exam BP 124/78 (BP Location: Left Wrist, Cuff Size: Normal)   Pulse 69   Temp 97.7 F (36.5 C)   Ht 5\' 2"  (1.575 m)   Wt 234 lb 6.4 oz (106.3 kg)   SpO2 98%   BMI 42.87 kg/m  GENERAL: Obese woman, no acute respiratory distress, fully ambulatory.  No conversational dyspnea.  No hoarseness noted today. HEAD: Normocephalic, atraumatic. EYES: Pupils equal, round, reactive to light.  No scleral icterus.  Mild periorbital puffiness. MOUTH: Wears dentures.  Oral mucosa moist.  No thrush. NECK: Supple. No thyromegaly. Trachea midline. No JVD.  No adenopathy. PULMONARY: Good air entry bilaterally, coarse, no other adventitious sounds. CARDIOVASCULAR: S1 and S2. Regular rate and rhythm.  Grade 2/6 systolic ejection murmur left sternal border. No change from prior.   GASTROINTESTINAL: Obese, benign. MUSCULOSKELETAL: No joint deformity, no  clubbing, no edema. NEUROLOGIC: Fully ambulatory with no gait disturbance noted.  Awake, alert, speech fluent, no overt focal deficits. SKIN: Intact,warm,dry.  On limited exam no rashes. PSYCH: Normal mood and behavior.     Assessment & Plan:     ICD-10-CM   1. Asthma-COPD overlap syndrome  J44.89    Continue Breztri 2 puffs twice a day Continue as needed albuterol    2. Chronic cough  R05.3    Patient is discussed with cardiology/primary switch off losartan Continue nasal hygiene Continue management of reflux    3. Laryngopharyngeal reflux (LPR)  K21.9    Continue antireflux measures Continue PPI    4. OSA on CPAP  G47.33    Patient 100% compliant with CPAP Continue CPAP use    5. Morbid obesity with BMI of 40.0-44.9, adult (HCC)  E66.01    Z68.41    This issue has complexity to her management Would benefit from weight loss    6. Need for immunization against influenza  Z23 Flu Vaccine QUAD High Dose(Fluad)   Patient received flu vaccine today     Orders Placed This Encounter   Procedures   Flu Vaccine QUAD High Dose(Fluad)   Patient will be seen in follow-up in 4 months time she is to contact us prior to that time should any new difficulties arise.  Gailen Shelter, MD Advanced Bronchoscopy PCCM [facility]    *This note was dictated using voice recognition software/Dragon.  Despite best efforts to proofread, errors can occur which can change the meaning. Any transcriptional errors that result from this process are unintentional and may not be fully corrected at the time of dictation.

## 2022-10-22 NOTE — Patient Instructions (Addendum)
Your compliance with the CPAP was very good.  Do discussed with Dr. Okey Dupre to see if there is any alternatives to the losartan for blood pressure medication.  We provided you with a flu vaccine today.  We will see him in follow-up in 4 months time call sooner should any new problems arise.

## 2022-10-23 HISTORY — PX: BASAL CELL CARCINOMA EXCISION: SHX1214

## 2022-11-13 ENCOUNTER — Ambulatory Visit: Payer: Medicare Other | Admitting: Nurse Practitioner

## 2022-11-13 ENCOUNTER — Encounter: Payer: Self-pay | Admitting: Nurse Practitioner

## 2022-11-13 NOTE — Progress Notes (Deleted)
Office Visit    Patient Name: Rhonda Oconnell Date of Encounter: 11/13/2022  Primary Care Provider:  Kandyce Rud, MD Primary Cardiologist:  Yvonne Kendall, MD  Chief Complaint    71 year old female with a history of chronic HFpEF, hypertension, hyperlipidemia, obstructive lung disease (COPD/asthma overlap syndrome complicated by recurrent sinusitis), anemia, GERD, and obesity, who presents for follow-up related to HFpEF.  Past Medical History    Past Medical History:  Diagnosis Date   Abdominal hernia    Allergic state    Anemia    Arthritis    Asthma    Cataract cortical, senile    Chest pain    a. 04/2013 MV: Nl EF, no isch/infarct ; b. 05/2020 MV: EF 55-65%, no isch/infarct.   Chronic heart failure with preserved ejection fraction (HFpEF) (HCC)    a. 03/2020 Echo: EF 55-60%, no rwma, GrII DD, mild Ao sclerosis; b. 02/2022 Echo: EF 60-65%, no rwma, GrI DD, nl RV fxn, Ao sclerosis w/o stenosis.   COPD (chronic obstructive pulmonary disease) (HCC)    Dysphagia    GERD (gastroesophageal reflux disease)    Hemorrhoids    Hyperlipidemia    Hypertension    Rectal tear    Rectocele    Past Surgical History:  Procedure Laterality Date   ABDOMINAL HYSTERECTOMY     ARTHOSCOPIC ROTAOR CUFF REPAIR     BLADDER REPAIR     CATARACT EXTRACTION     CHOLECYSTECTOMY     COLONOSCOPY WITH PROPOFOL N/A 07/22/2016   Procedure: COLONOSCOPY WITH PROPOFOL;  Surgeon: Scot Jun, MD;  Location: [facility] ENDOSCOPY;  Service: Endoscopy;  Laterality: N/A;   ESOPHAGOGASTRODUODENOSCOPY (EGD) WITH PROPOFOL N/A 07/22/2016   Procedure: ESOPHAGOGASTRODUODENOSCOPY (EGD) WITH PROPOFOL;  Surgeon: Scot Jun, MD;  Location: [facility] ENDOSCOPY;  Service: Endoscopy;  Laterality: N/A;   HEMORRHOID SURGERY     HERNIA REPAIR     LAPAROSCOPIC SPLENECTOMY     TUBAL LIGATION      Allergies  Allergies  Allergen Reactions   Iodinated Contrast Media Hives   Iodine Hives   Sulfa Antibiotics Hives     History of Present Illness    71 year old female with the above past medical history including chronic HFpEF, hypertension, hyperlipidemia, 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  She was previously evaluated at [facility] clinic with stress testing in May 2014, which showed normal LV function and no evidence of ischemia or infarct.  She subsequently establish care with Dr. Okey Dupre in early 2021 in the setting of dyspnea on exertion.  An echocardiogram showed an EF of 55 to 60% with grade 2 diastolic dysfunction and mild aortic sclerosis without stenosis.  Stress testing was undertaken in June 2021 and showed no evidence of ischemia or infarct, with normal LV function.  She was last seen in cardiology clinic in February 2023, at which time she was being worked up by pulmonology and [facility] related to a vocal cord abnormality and ongoing cough, sinus congestion, and laryngitis.  She follow-up with pulmonology and she was switched from losartan to Benicar in the setting of chronic cough but subsequently went back to losartan.  Repeat echo in March 2023 again showed normal LV function with grade 1 diastolic dysfunction and aortic sclerosis.  Following that visit, at some point she was switched from losartan to spironolactone.  She was worried about experiencing elevated blood pressures and went back on losartan as well, but did note some improvement  in cough while off of losartan.  Home Medications    Current Outpatient Medications  Medication Sig Dispense Refill   acetaminophen (TYLENOL) 500 MG tablet Take 500 mg by mouth every 6 (six) hours as needed.     albuterol (VENTOLIN HFA) 108 (90 Base) MCG/ACT inhaler INHALE 2 PUFFS INTO THE LUNGS EVERY 6 HOURS AS NEEDED FOR WHEEZING OR SHORTNESS OF BREATH 54 g 0   aspirin EC 81 MG tablet Take 81 mg by mouth daily.     atorvastatin (LIPITOR) 20 MG tablet Take 20 mg by mouth daily.      azelastine (ASTELIN) 0.1 % nasal spray Place 2 sprays into both nostrils 2 (two) times daily. Use in each nostril as directed 30 mL 2   Budeson-Glycopyrrol-Formoterol (BREZTRI AEROSPHERE) 160-9-4.8 MCG/ACT AERO Inhale 2 puffs into the lungs in the morning and at bedtime. 5.9 g 11   Calcium Carbonate-Vitamin D (OSCAL 500/200 D-3 PO) Take 1 tablet by mouth 2 (two) times daily.     cetirizine (ZYRTEC) 10 MG tablet Take 1 tablet (10 mg total) by mouth daily. 90 tablet 3   chlorpheniramine (CHLOR-TRIMETON) 4 MG tablet Take 4 mg by mouth 2 (two) times daily as needed for allergies.     diphenhydramine-acetaminophen (TYLENOL PM) 25-500 MG TABS tablet Take 1 tablet by mouth at bedtime as needed.     furosemide (LASIX) 20 MG tablet Take 20 mg by mouth daily as needed.     hydrochlorothiazide (HYDRODIURIL) 12.5 MG tablet Take 1 tablet by mouth daily.     losartan (COZAAR) 100 MG tablet Take 100 mg by mouth daily.     meloxicam (MOBIC) 15 MG tablet Take 7.5 mg by mouth at bedtime.     montelukast (SINGULAIR) 10 MG tablet Take 10 mg by mouth at bedtime.     Multiple Vitamin (MULTIVITAMIN) tablet Take 1 tablet by mouth daily.     NON FORMULARY CPAP at bedtime.     omeprazole (PRILOSEC) 40 MG capsule TAKE 1 CAPSULE (40 MG TOTAL) BY MOUTH DAILY. 90 capsule 1   Spacer/Aero-Holding Chambers DEVI 1 each by Does not apply route daily. 1 each 0   No current facility-administered medications for this visit.     Review of Systems    ***.  All other systems reviewed and are otherwise negative except as noted above.    Physical Exam    VS:  There were no vitals taken for this visit. , BMI There is no height or weight on file to calculate BMI.     GEN: Well nourished, well developed, in no acute distress. HEENT: normal. Neck: Supple, no JVD, carotid bruits, or masses. Cardiac: RRR, no murmurs, rubs, or gallops. No clubbing, cyanosis, edema.  Radials 2+/PT 2+ and equal bilaterally.  Respiratory:   Respirations regular and unlabored, clear to auscultation bilaterally. GI: Soft, nontender, nondistended, BS + x 4. MS: no deformity or atrophy. Skin: warm and dry, no rash. Neuro:  Strength and sensation are intact. Psych: Normal affect.  Accessory Clinical Findings    ECG personally reviewed by me today - *** - no acute changes.  No results found for: "WBC", "HGB", "HCT", "MCV", "PLT" Lab Results  Component Value Date   CREATININE 0.82 06/20/2020   BUN 15 06/20/2020   NA 137 06/20/2020   K 4.0 06/20/2020   CL 98 06/20/2020   CO2 27 06/20/2020   No results found for: "ALT", "AST", "GGT", "ALKPHOS", "BILITOT" No results found for: "CHOL", "HDL", "LDLCALC", "LDLDIRECT", "TRIG", "  CHOLHDL"  No results found for: "HGBA1C"  Assessment & Plan    1.  ***   Nicolasa Ducking, NP 11/13/2022, 12:53 PM

## 2022-11-15 ENCOUNTER — Ambulatory Visit: Payer: Medicare Other | Attending: Nurse Practitioner | Admitting: Nurse Practitioner

## 2022-11-15 ENCOUNTER — Encounter: Payer: Self-pay | Admitting: Nurse Practitioner

## 2022-11-15 VITALS — BP 144/80 | HR 77 | Ht 62.5 in | Wt 234.0 lb

## 2022-11-15 DIAGNOSIS — G4733 Obstructive sleep apnea (adult) (pediatric): Secondary | ICD-10-CM | POA: Insufficient documentation

## 2022-11-15 DIAGNOSIS — E782 Mixed hyperlipidemia: Secondary | ICD-10-CM | POA: Insufficient documentation

## 2022-11-15 DIAGNOSIS — J449 Chronic obstructive pulmonary disease, unspecified: Secondary | ICD-10-CM | POA: Diagnosis not present

## 2022-11-15 DIAGNOSIS — I10 Essential (primary) hypertension: Secondary | ICD-10-CM | POA: Insufficient documentation

## 2022-11-15 DIAGNOSIS — I5032 Chronic diastolic (congestive) heart failure: Secondary | ICD-10-CM | POA: Insufficient documentation

## 2022-11-15 MED ORDER — AMLODIPINE BESYLATE 5 MG PO TABS: 5.0000 mg | ORAL_TABLET | Freq: Every day | ORAL | 6 refills | Status: DC

## 2022-11-15 NOTE — Progress Notes (Signed)
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  Past Medical History    Past Medical History:  Diagnosis Date   Abdominal hernia    Allergic state    Anemia    Arthritis    Asthma    Cataract cortical, senile    Chest pain    a. 04/2013 MV: Nl EF, no isch/infarct ; b. 05/2020 MV: EF 55-65%, no isch/infarct.   Chronic heart failure with preserved ejection fraction (HFpEF) (HCC)    a. 03/2020 Echo: EF 55-60%, no rwma, GrII DD, mild Ao sclerosis; b. 02/2022 Echo: EF 60-65%, no rwma, GrI DD, nl RV fxn, Ao sclerosis w/o stenosis.   COPD (chronic obstructive pulmonary disease) (HCC)    Dysphagia    GERD (gastroesophageal reflux disease)    Hemorrhoids    Hyperlipidemia    Hypertension    Rectal tear    Rectocele    Past Surgical History:  Procedure Laterality Date   ABDOMINAL HYSTERECTOMY     ARTHOSCOPIC ROTAOR CUFF REPAIR     BLADDER REPAIR     CATARACT EXTRACTION     CHOLECYSTECTOMY     COLONOSCOPY WITH PROPOFOL N/A 07/22/2016   Procedure: COLONOSCOPY WITH PROPOFOL;  Surgeon: Scot Jun, MD;  Location: [facility] ENDOSCOPY;  Service: Endoscopy;  Laterality: N/A;   ESOPHAGOGASTRODUODENOSCOPY (EGD) WITH PROPOFOL N/A 07/22/2016   Procedure: ESOPHAGOGASTRODUODENOSCOPY (EGD) WITH PROPOFOL;  Surgeon: Scot Jun, MD;  Location: [facility] ENDOSCOPY;  Service: Endoscopy;  Laterality: N/A;   HEMORRHOID SURGERY     HERNIA REPAIR     LAPAROSCOPIC SPLENECTOMY     SKIN CANCER EXCISION     basal cell carcinoma removed from forehead   TUBAL LIGATION      Allergies  Allergies  Allergen Reactions    Iodinated Contrast Media Hives   Iodine Hives   Sulfa Antibiotics Hives    History of Present Illness    71 year old female with the above past medical history including chronic HFpEF, hypertension, hyperlipidemia, 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  She was previously evaluated at [facility] clinic with stress testing in May 2014, which showed normal LV function and no evidence of ischemia or infarct.  She subsequently established care with Dr. Okey Dupre in early 2021 in the setting of dyspnea on exertion.  An echocardiogram showed an EF of 55 to 60% with grade 2 diastolic dysfunction and mild aortic sclerosis without stenosis.  Stress testing was undertaken in June 2021 and showed no evidence of ischemia or infarct, with normal LV function.   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  Repeat echo in March 2023 again showed normal LV function with grade 1 diastolic dysfunction and aortic sclerosis.  She followed-up with pulmonology and she was switched from losartan to Benicar in the setting of chronic cough in August, but subsequently went back to losartan.  In an effort to get off of losartan, she was prescribed spironolactone but she felt like this was  not strong enough to keep her blood pressure under control, and she went back onto losartan.  She overall been feeling well.  She has chronic, stable dyspnea on exertion and notes that right now she is in a good spot.  She does not experience chest pain.  She rarely has to take Lasix, noting that she really only swells up when she goes to the beach, though says she does not really change her diet while she is there.  She has not taken Lasix since the summer.  She does have very mild edema at the end of the day just  above her sock line at baseline.  She uses HCTZ 12.5 mg daily.  She would be interested in trying amlodipine.  She denies palpitations, PND, orthopnea, dizziness, syncope, or early satiety.  Home Medications    Current Outpatient Medications  Medication Sig Dispense Refill   acetaminophen (TYLENOL) 500 MG tablet Take 500 mg by mouth every 6 (six) hours as needed.     albuterol (VENTOLIN HFA) 108 (90 Base) MCG/ACT inhaler INHALE 2 PUFFS INTO THE LUNGS EVERY 6 HOURS AS NEEDED FOR WHEEZING OR SHORTNESS OF BREATH 54 g 0   amLODipine (NORVASC) 5 MG tablet Take 1 tablet (5 mg total) by mouth daily. 30 tablet 6   atorvastatin (LIPITOR) 20 MG tablet Take 20 mg by mouth daily.     azelastine (ASTELIN) 0.1 % nasal spray Place 2 sprays into both nostrils 2 (two) times daily. Use in each nostril as directed 30 mL 2   Budeson-Glycopyrrol-Formoterol (BREZTRI AEROSPHERE) 160-9-4.8 MCG/ACT AERO Inhale 2 puffs into the lungs in the morning and at bedtime. 5.9 g 11   Calcium Carbonate-Vitamin D (OSCAL 500/200 D-3 PO) Take 1 tablet by mouth 2 (two) times daily.     cetirizine (ZYRTEC) 10 MG tablet Take 1 tablet (10 mg total) by mouth daily. 90 tablet 3   chlorpheniramine (CHLOR-TRIMETON) 4 MG tablet Take 4 mg by mouth 2 (two) times daily as needed for allergies.     diphenhydramine-acetaminophen (TYLENOL PM) 25-500 MG TABS tablet Take 1 tablet by mouth at bedtime as needed.     furosemide (LASIX) 20 MG tablet Take 20 mg by mouth daily as needed.     hydrochlorothiazide (HYDRODIURIL) 12.5 MG tablet Take 1 tablet by mouth daily.     losartan (COZAAR) 100 MG tablet Take 50 mg by mouth daily.     montelukast (SINGULAIR) 10 MG tablet Take 10 mg by mouth at bedtime.     Multiple Vitamin (MULTIVITAMIN) tablet Take 1 tablet by mouth daily.     NON FORMULARY CPAP at bedtime.     omeprazole (PRILOSEC) 40 MG capsule TAKE 1 CAPSULE (40 MG TOTAL) BY MOUTH DAILY. 90 capsule 1   Spacer/Aero-Holding Chambers DEVI 1 each by Does  not apply route daily. 1 each 0   No current facility-administered medications for this visit.     Review of Systems    Chronic, stable dyspnea exertion.  Mild swelling noted at the end of the day just above her sock line.  She denies chest pain, palpitations, PND, orthopnea, dizziness, syncope, or early satiety.  All other systems reviewed and are otherwise negative except as noted above.    Physical Exam    VS:  BP (!) 144/80   Pulse 77   Ht 5' 2.5" (1.588 m)   Wt 234 lb (106.1 kg)   SpO2 96%   BMI 42.12 kg/m  , BMI Body mass index is 42.12  kg/m.     GEN: Well nourished, well developed, in no acute distress. HEENT: normal. Neck: Supple, no JVD, carotid bruits, or masses. Cardiac: RRR, no murmurs, rubs, or gallops. No clubbing, cyanosis, trace left ankle edema.  Radials 2+/PT 2+ and equal bilaterally.  Respiratory:  Respirations regular and unlabored, clear to auscultation bilaterally. GI: Soft, nontender, nondistended, BS + x 4. MS: no deformity or atrophy. Skin: warm and dry, no rash. Neuro:  Strength and sensation are intact. Psych: Normal affect.  Accessory Clinical Findings    ECG personally reviewed by me today -regular sinus rhythm, 77, abnormal R wave progression- no acute changes.  Labs dated July 09, 2022 from [place] clinic:  Hemoglobin 12.5, medic at 39.0, WBC 14.8, platelets 393 Sodium 139, potassium 4.2, chloride 101, CO2 30.2, BUN 10, creatinine 0.7, glucose 95 Total protein 6.5, albumin 4.0, calcium 10.0 Total bilirubin 0.4, alkaline phosphatase 87, AST 17, ALT 17 Hemoglobin A1c 6.3 Total cholesterol 206, triglycerides 184, HDL 49.6, LDL 120  Assessment & Plan    1.  Essential hypertension: Patient has been on losartan for some time but recently had a trial off of it and noted an improvement in cough in the context of chronic obstructive lung disease.  She did not tolerate Benicar due to fatigue and lower blood pressures.  Blood pressure is elevated  today at 150/82.  We discussed options for management.  Given her lung disease, beta-blockers are clearly not an option.  It appears that both ACE inhibitor's and ARB's contribute to her cough.  She did not feel that spironolactone effectively lowered her blood pressure and thus has not been taking.  We agreed to add amlodipine 5 mg daily and reduce her losartan to 50 mg daily.  She will continue to follow her blood pressures at home and provided that she does not have any increase in swelling, we can likely look to titrate to 10 mg of amlodipine and discontinue losartan altogether.  I did advise that if she develops worsening lower extremity edema on amlodipine, we would have a low threshold to discontinue, to potentially increase her HCTZ, and consider hydralazine or clonidine patch.  Plan to see her back in 3 to 4 weeks.  2.  Chronic HFpEF: Chronic, mild/trace left ankle swelling as well as some venous insufficiency with mild swelling above her sock line at the end of the day.  We will have to keep a close eye on this in the setting of amlodipine therapy.  Volume is otherwise able today and she rarely uses Lasix.  3.  Hyperlipidemia: LDL of 120.  She is currently on atorvastatin 20 mg and has been followed by primary care.  4.  Obstructive sleep apnea: Compliant with CPAP.  5.  Obstructive lung disease: Currently feels well.  Followed closely by pulmonology.  6.  Disposition: Follow-up in clinic in 1 month or sooner if necessary.   Nicolasa Ducking, NP 11/15/2022, 5:49 PM

## 2022-11-15 NOTE — Patient Instructions (Addendum)
Medication Instructions:  Start amlodipine 5 mg daily.  Reduce losartan to 100 mg half a tablet (50 mg) daily *If you need a refill on your cardiac medications before your next appointment, please call your pharmacy*   Lab Work: None If you have labs (blood work) drawn today and your tests are completely normal, you will receive your results only by: MyChart Message (if you have MyChart) OR A paper copy in the mail If you have any lab test that is abnormal or we need to change your treatment, we will call you to review the results.   Testing/Procedures: None   Follow-Up: At [facility], you and your health needs are our priority.  As part of our continuing mission to provide you with exceptional heart care, we have created designated Provider Care Teams.  These Care Teams include your primary Cardiologist (physician) and Advanced Practice Providers (APPs -  Physician Assistants and Nurse Practitioners) who all work together to provide you with the care you need, when you need it.  We recommend signing up for the patient portal called "MyChart".  Sign up information is provided on this After Visit Summary.  MyChart is used to connect with patients for Virtual Visits (Telemedicine).  Patients are able to view lab/test results, encounter notes, upcoming appointments, etc.  Non-urgent messages can be sent to your provider as well.   To learn more about what you can do with MyChart, go to [email or web address].    Your next appointment:   1 month(s)  The format for your next appointment:   In Person  Provider:       Other Instructions N/A  Important Information About Sugar

## 2022-11-21 ENCOUNTER — Encounter: Payer: Self-pay | Admitting: Pulmonary Disease

## 2022-11-21 MED ORDER — METHYLPREDNISOLONE 4 MG PO TBPK: ORAL_TABLET | ORAL | 0 refills | Status: DC

## 2022-11-21 MED ORDER — AMOXICILLIN-POT CLAVULANATE 875-125 MG PO TABS: 1.0000 | ORAL_TABLET | Freq: Two times a day (BID) | ORAL | 0 refills | Status: DC

## 2022-11-21 NOTE — Telephone Encounter (Signed)
Send in Augmentin 875 1 tablet twice a day for 10 days.  Make sure she takes the medication with food.  Can also send a Medrol Dosepak #21 tablets no refills, as directed in the package.  I also recommend that she follow-up with her ENT as it appears that her sinus disease is getting worse.

## 2022-12-20 ENCOUNTER — Encounter: Payer: Self-pay | Admitting: Nurse Practitioner

## 2022-12-20 ENCOUNTER — Ambulatory Visit: Payer: Medicare Other | Attending: Nurse Practitioner | Admitting: Nurse Practitioner

## 2022-12-20 VITALS — BP 148/90 | HR 77 | Ht 63.0 in | Wt 233.1 lb

## 2022-12-20 DIAGNOSIS — E782 Mixed hyperlipidemia: Secondary | ICD-10-CM | POA: Diagnosis not present

## 2022-12-20 DIAGNOSIS — G4733 Obstructive sleep apnea (adult) (pediatric): Secondary | ICD-10-CM | POA: Diagnosis present

## 2022-12-20 DIAGNOSIS — R002 Palpitations: Secondary | ICD-10-CM | POA: Insufficient documentation

## 2022-12-20 DIAGNOSIS — I5032 Chronic diastolic (congestive) heart failure: Secondary | ICD-10-CM | POA: Diagnosis not present

## 2022-12-20 DIAGNOSIS — I10 Essential (primary) hypertension: Secondary | ICD-10-CM | POA: Insufficient documentation

## 2022-12-20 DIAGNOSIS — Z79899 Other long term (current) drug therapy: Secondary | ICD-10-CM | POA: Insufficient documentation

## 2022-12-20 DIAGNOSIS — J449 Chronic obstructive pulmonary disease, unspecified: Secondary | ICD-10-CM | POA: Insufficient documentation

## 2022-12-20 MED ORDER — HYDROCHLOROTHIAZIDE 25 MG PO TABS: ORAL_TABLET | ORAL | 3 refills | Status: AC

## 2022-12-20 NOTE — Patient Instructions (Signed)
Medication Instructions:  - Your physician has recommended you make the following change in your medication:   1) STOP Amlodipine  2) INCREASE Losartan to 100 mg: - take 1 tablet by mouth once daily   3) INCREASE Hydrochlorothiazide (HCTZ) to 25 mg: - take 1 tablet by mouth once daily   *If you need a refill on your cardiac medications before your next appointment, please call your pharmacy*   Lab Work: - Your physician recommends that you return for lab work in: 1 week (around 12/27/22)  [place] at [facility] 1st desk on the right to check in (REGISTRATION)  Lab hours: Monday- Friday (7:30 am- 5:30 pm)   If you have labs (blood work) drawn today and your tests are completely normal, you will receive your results only by: MyChart Message (if you have MyChart) OR A paper copy in the mail If you have any lab test that is abnormal or we need to change your treatment, we will call you to review the results.   Testing/Procedures: - none ordered   Follow-Up: At [facility], you and your health needs are our priority.  As part of our continuing mission to provide you with exceptional heart care, we have created designated Provider Care Teams.  These Care Teams include your primary Cardiologist (physician) and Advanced Practice Providers (APPs -  Physician Assistants and Nurse Practitioners) who all work together to provide you with the care you need, when you need it.  We recommend signing up for the patient portal called "MyChart".  Sign up information is provided on this After Visit Summary.  MyChart is used to connect with patients for Virtual Visits (Telemedicine).  Patients are able to view lab/test results, encounter notes, upcoming appointments, etc.  Non-urgent messages can be sent to your provider as well.   To learn more about what you can do with MyChart, go to [email or web address].    Your next appointment:   4 week(s)  Provider:   You may see  Nelva Bush, MD or one of the following Advanced Practice Providers on your designated Care Team:   Murray Hodgkins, NP   Other Instructions N/a

## 2022-12-20 NOTE — Progress Notes (Addendum)
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  Past Medical History    Past Medical History:  Diagnosis Date   Abdominal hernia    Allergic state    Anemia    Arthritis    Asthma    Cataract cortical, senile    Chest pain    a. 04/2013 MV: Nl EF, no isch/infarct ; b. 05/2020 MV: EF 55-65%, no isch/infarct.   Chronic heart failure with preserved ejection fraction (HFpEF) ([place])    a. 03/2020 Echo: EF 55-60%, no rwma, GrII DD, mild Ao sclerosis; b. 02/2022 Echo: EF 60-65%, no rwma, GrI DD, nl RV fxn, Ao sclerosis w/o stenosis.   COPD (chronic obstructive pulmonary disease) (HCC)    Dysphagia    GERD (gastroesophageal reflux disease)    Hemorrhoids    Hyperlipidemia    Hypertension    Rectal tear    Rectocele    Past Surgical History:  Procedure Laterality Date   ABDOMINAL HYSTERECTOMY     ARTHOSCOPIC ROTAOR CUFF REPAIR     BLADDER REPAIR     CATARACT EXTRACTION     CHOLECYSTECTOMY     COLONOSCOPY WITH PROPOFOL N/A 07/22/2016   Procedure: COLONOSCOPY WITH PROPOFOL;  Surgeon: Manya Silvas, MD;  Location: [place];  Service: Endoscopy;  Laterality: N/A;   ESOPHAGOGASTRODUODENOSCOPY (EGD) WITH PROPOFOL N/A 07/22/2016   Procedure: ESOPHAGOGASTRODUODENOSCOPY (EGD) WITH PROPOFOL;  Surgeon: Manya Silvas, MD;  Location: [facility] ENDOSCOPY;  Service: Endoscopy;  Laterality: N/A;   HEMORRHOID SURGERY     HERNIA REPAIR     LAPAROSCOPIC SPLENECTOMY     SKIN CANCER EXCISION     basal cell carcinoma removed from forehead   TUBAL LIGATION      Allergies  Allergies  Allergen Reactions   Iodinated  Contrast Media Hives   Iodine Hives   Sulfa Antibiotics Hives    History of Present Illness    72 year old female with above past medical history including chronic HFpEF, hypertension, hyperlipidemia, 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  She was previously evaluated at [facility] clinic with stress testing in May 2014, which showed normal LV function and no evidence of ischemia or infarct.  She subsequently established care with Dr. Saunders Revel in early 2021 in the setting of dyspnea on exertion.  An echocardiogram showed an EF of 55 to 60% with grade 2 diastolic dysfunction and mild aortic sclerosis without stenosis.  Stress testing was undertaken in June 2021 and showed no evidence of ischemia or infarct, with normal LV function.  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  Pulmonology switched her losartan to Benicar in the setting of chronic cough however, Benicar resulted in fatigue and lower blood pressures.  She subsequently went back on losartan.  She was then prescribed spironolactone and losartan discontinued again however, blood pressures elevated and she took herself off of the spironolactone and went back on losartan 50 mg daily.  She also takes HCTZ 25 mg daily.  At office follow-up on December 8, pressure remained elevated and we agreed to add amlodipine 5 mg daily and reduce her losartan back to 50 mg daily.  Since her last visit, blood pressures have been mostly trending in the 150 range.  Unfortunately, she has also noticed mild ankle swelling since starting amlodipine.  This is resulted in some mild lower extremity tenderness as well.  She does not experience chest pain.  She has  some degree of chronic dyspnea on exertion in the setting of obstructive lung disease.  Her chronic cough has been stable.  Since starting amlodipine, she has also noticed occasional tachypalpitations described as hard and fast heartbeats occurring about twice a week lasting just a couple seconds, resolving spontaneously.  She has not had presyncope or syncope and denies PND, orthopnea, or early satiety.  Home Medications    Current Outpatient Medications  Medication Sig Dispense Refill   acetaminophen (TYLENOL) 500 MG tablet Take 500 mg by mouth every 6 (six) hours as needed.     albuterol (VENTOLIN HFA) 108 (90 Base) MCG/ACT inhaler INHALE 2 PUFFS INTO THE LUNGS EVERY 6 HOURS AS NEEDED FOR WHEEZING OR SHORTNESS OF BREATH 54 g 0   amLODipine (NORVASC) 5 MG tablet Take 1 tablet (5 mg total) by mouth daily. 30 tablet 6   atorvastatin (LIPITOR) 20 MG tablet Take 20 mg by mouth daily.     azelastine (ASTELIN) 0.1 % nasal spray Place 2 sprays into both nostrils 2 (two) times daily. Use in each nostril as directed 30 mL 2   Budeson-Glycopyrrol-Formoterol (BREZTRI AEROSPHERE) 160-9-4.8 MCG/ACT AERO Inhale 2 puffs into the lungs in the morning and at bedtime. 5.9 g 11   Calcium Carbonate-Vitamin D (OSCAL 500/200 D-3 PO) Take 1 tablet by mouth 2 (two) times daily.     cetirizine (ZYRTEC) 10 MG tablet Take 1 tablet (10 mg total) by mouth daily. 90 tablet 3   chlorpheniramine (CHLOR-TRIMETON) 4 MG tablet Take 4 mg by mouth 2 (two) times daily as needed for allergies.     diphenhydramine-acetaminophen (TYLENOL PM) 25-500 MG TABS tablet Take 1 tablet by mouth at bedtime as needed.     furosemide (LASIX) 20 MG tablet Take 20 mg by mouth daily as needed.     hydrochlorothiazide (HYDRODIURIL) 12.5 MG tablet Take 1 tablet by mouth daily.     losartan (COZAAR) 100 MG tablet Take 50 mg by mouth daily.     montelukast (SINGULAIR) 10 MG tablet Take 10 mg by mouth at bedtime.     Multiple Vitamin (MULTIVITAMIN)  tablet Take 1 tablet by mouth daily.     NON FORMULARY CPAP at bedtime.     omeprazole (PRILOSEC) 40 MG capsule TAKE 1 CAPSULE (40 MG TOTAL) BY MOUTH DAILY. 90 capsule 1   Spacer/Aero-Holding Chambers DEVI 1 each by Does not apply route daily. 1 each 0   amoxicillin-clavulanate (AUGMENTIN) 875-125 MG tablet Take 1 tablet by mouth 2 (two) times daily. (Patient not taking: Reported on 12/20/2022) 10 tablet 0   methylPREDNISolone (MEDROL DOSEPAK) 4 MG TBPK tablet Use as directed (Patient not taking: Reported on 12/20/2022) 21 each 0   No current facility-administered medications for this visit.     Review of Systems    Chronic, stable dyspnea on exertion.  Mild lower extremity edema since starting amlodipine.  Episodic palpitations about twice a week as outlined above.  She denies chest pain, PND, orthopnea, dizziness, syncope, or early satiety.  All other systems reviewed and are otherwise negative except as noted above.    Physical Exam    VS:  BP (!) 150/88 (BP Location: Left Arm, Patient Position: Sitting, Cuff Size: Large)   Pulse 77   Ht 5\' 3"  (1.6 m)   Wt 233 lb 2 oz (105.7 kg)   SpO2 98%   BMI 41.30 kg/m  , BMI Body mass index is 41.3 kg/m.     GEN: Well nourished, well developed, in no acute distress. HEENT: normal. Neck: Supple, no JVD, carotid bruits, or masses. Cardiac: RRR, no murmurs, rubs, or gallops. No clubbing, cyanosis, trace right lower extremity and 1+ left lower extremity edema.  Radials 2+/PT 2+ and equal bilaterally.  Respiratory:  Respirations regular and unlabored, clear to auscultation bilaterally. GI: Soft, nontender, nondistended, BS + x 4. MS: no deformity or atrophy. Skin: warm and dry, no rash. Neuro:  Strength and sensation are intact. Psych: Normal affect.  Accessory Clinical Findings    Labs dated July 09, 2022 from [place] [facility]:   Hemoglobin 12.5, medic at 39.0, WBC 14.8, platelets 393 Sodium 139, potassium 4.2, chloride 101, CO2 30.2, BUN  10, creatinine 0.7, glucose 95 Total protein 6.5, albumin 4.0, calcium 10.0 Total bilirubin 0.4, alkaline phosphatase 87, AST 17, ALT 17 Hemoglobin A1c 6.3 Total cholesterol 206, triglycerides 184, HDL 49.6, LDL 120  Assessment & Plan    1.  Essential hypertension: Somewhat difficult to manage and complicated by obstructive lung disease limiting antihypertensive options to some degree.  She is currently on losartan 50 mg daily, amlodipine 5 mg daily, and HCTZ 12.5 mg daily.  Amlodipine was added at her last visit however unfortunately, this has not made a significant change in blood pressure and in fact has caused some lower extremity swelling.  We will discontinue amlodipine and I have asked her to increase her losartan back to 100 mg daily will also increase HCTZ to 25 mg daily.  Follow-up basic metabolic panel in 1 week.  I encouraged her to contact [place] within the next 2 weeks if systolic pressures remain greater than 140 mmHg, at which time I would like to add either hydralazine or clonidine.  We did discuss possibly going back to Benicar trying valsartan however, she did not tolerate Benicar due to fatigue and lower blood pressures.  2.  Chronic HFpEF: Mild ankle swelling the setting of amlodipine therapy, which we will discontinue.  Her weight has overall been stable.  She has not required any as needed Lasix.  Adjusting blood pressure medications as outlined above.  3.  Hyperlipidemia: On atorvastatin therapy and followed by primary care.  4.  Obstructive sleep apnea: Compliant with CPAP.  5.  Obstructive lung disease: Currently feels well.  No active wheezing and good air movement.  Followed by pulmonology.  6.  Palpitations: Following initiation of amlodipine, she has had occasional elevations in heart rates with a pounding sensation.  As we are stopping amlodipine, we agreed to hold off on ordering monitoring at this time but if symptoms persist despite discontinuation, will look to add a  Zio.    7.  Disposition: Follow-up basic metabolic panel in 1 week.  Follow-up in clinic in 1 month or sooner if necessary.  [place], NP 12/20/2022, 1:48 PM

## 2022-12-27 ENCOUNTER — Other Ambulatory Visit
Admission: RE | Admit: 2022-12-27 | Discharge: 2022-12-27 | Disposition: A | Discharge status: Home / Self Care | Payer: Medicare Other | Source: Ambulatory Visit | Attending: Nurse Practitioner | Admitting: Nurse Practitioner

## 2022-12-27 DIAGNOSIS — I10 Essential (primary) hypertension: Secondary | ICD-10-CM | POA: Diagnosis present

## 2022-12-27 DIAGNOSIS — Z79899 Other long term (current) drug therapy: Secondary | ICD-10-CM | POA: Diagnosis present

## 2022-12-27 DIAGNOSIS — I5032 Chronic diastolic (congestive) heart failure: Secondary | ICD-10-CM | POA: Insufficient documentation

## 2022-12-27 LAB — BASIC METABOLIC PANEL
Anion gap: 8 (ref 5–15)
BUN: 14 mg/dL (ref 8–23)
CO2: 27 mmol/L (ref 22–32)
Calcium: 9.6 mg/dL (ref 8.9–10.3)
Chloride: 101 mmol/L (ref 98–111)
Creatinine, Ser: 0.85 mg/dL (ref 0.44–1.00)
GFR, Estimated: >60 mL/min (ref >60)
Glucose, Bld: 120 mg/dL — ABNORMAL HIGH (ref 70–99)
Potassium: 3.6 mmol/L (ref 3.5–5.1)
Sodium: 136 mmol/L (ref 135–145)

## 2023-01-08 ENCOUNTER — Other Ambulatory Visit: Payer: Self-pay | Admitting: Pulmonary Disease

## 2023-01-24 ENCOUNTER — Ambulatory Visit: Payer: Medicare Other | Attending: Nurse Practitioner | Admitting: Nurse Practitioner

## 2023-01-24 ENCOUNTER — Encounter: Payer: Self-pay | Admitting: Nurse Practitioner

## 2023-01-24 VITALS — BP 122/78 | HR 74 | Ht 63.0 in | Wt 233.6 lb

## 2023-01-24 DIAGNOSIS — E782 Mixed hyperlipidemia: Secondary | ICD-10-CM | POA: Diagnosis present

## 2023-01-24 DIAGNOSIS — G4733 Obstructive sleep apnea (adult) (pediatric): Secondary | ICD-10-CM | POA: Diagnosis not present

## 2023-01-24 DIAGNOSIS — I5032 Chronic diastolic (congestive) heart failure: Secondary | ICD-10-CM

## 2023-01-24 DIAGNOSIS — I10 Essential (primary) hypertension: Secondary | ICD-10-CM | POA: Diagnosis not present

## 2023-01-24 DIAGNOSIS — J449 Chronic obstructive pulmonary disease, unspecified: Secondary | ICD-10-CM | POA: Insufficient documentation

## 2023-01-24 NOTE — Progress Notes (Signed)
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  Past Medical History    Past Medical History:  Diagnosis Date   Abdominal hernia    Allergic state    Anemia    Arthritis    Asthma    Cataract cortical, senile    Chest pain    a. 04/2013 MV: Nl EF, no isch/infarct ; b. 05/2020 MV: EF 55-65%, no isch/infarct.   Chronic heart failure with preserved ejection fraction (HFpEF) ([place])    a. 03/2020 Echo: EF 55-60%, no rwma, GrII DD, mild Ao sclerosis; b. 02/2022 Echo: EF 60-65%, no rwma, GrI DD, nl RV fxn, Ao sclerosis w/o stenosis.   COPD (chronic obstructive pulmonary disease) (HCC)    Dysphagia    GERD (gastroesophageal reflux disease)    Hemorrhoids    Hyperlipidemia    Hypertension    Rectal tear    Rectocele    Past Surgical History:  Procedure Laterality Date   ABDOMINAL HYSTERECTOMY     ARTHOSCOPIC ROTAOR CUFF REPAIR     BASAL CELL CARCINOMA EXCISION  10/23/2022   BLADDER REPAIR     CATARACT EXTRACTION     CHOLECYSTECTOMY     COLONOSCOPY WITH PROPOFOL N/A 07/22/2016   Procedure: COLONOSCOPY WITH PROPOFOL;  Surgeon: Manya Silvas, MD;  Location: [facility];  Service: Endoscopy;  Laterality: N/A;   ESOPHAGOGASTRODUODENOSCOPY (EGD) WITH PROPOFOL N/A 07/22/2016   Procedure: ESOPHAGOGASTRODUODENOSCOPY (EGD) WITH PROPOFOL;  Surgeon: Manya Silvas, MD;  Location: [facility] ENDOSCOPY;  Service: Endoscopy;  Laterality: N/A;   HEMORRHOID SURGERY     HERNIA REPAIR     LAPAROSCOPIC SPLENECTOMY     SKIN CANCER EXCISION     basal cell carcinoma removed from forehead   TUBAL LIGATION       Allergies  Allergies  Allergen Reactions   Iodinated Contrast Media Hives   Iodine Hives   Sulfa Antibiotics Hives   Amlodipine Swelling    History of Present Illness    72 year old female with above complex past medical history including chronic HFpEF, hypertension, hyperlipidemia, 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  She was previously evaluated at [facility] clinic with stress testing in May 2014, which showed normal LV function and no evidence of ischemia or infarct.  She establish care with Dr. Saunders Revel in early 2021 in the setting of dyspnea on exertion.  Echo showed an EF of 55 to 60% with grade 2 diastolic dysfunction and mild aortic sclerosis without stenosis.  Stress testing in July 2021 showed no evidence of ischemia or infarct and normal LV function.  Repeat echo in March 2023 in the setting of ongoing cough, sinus congestion, and laryngitis showed normal LV function with grade 1 diastolic dysfunction and aortic sclerosis.    In 2023, she was switched from losartan to Benicar in the setting of chronic cough however, Benicar resulted in fatigue and low blood pressures and she subsequently went back to losartan.  In the setting of ongoing hypertension, amlodipine was added however, this resulted in lower extremity swelling.  We subsequently increase losartan to 100 mg daily and increase HCTZ to 25 mg daily in  January 2024.  With this change, she has had significant improvement in blood pressures and overall, has been feeling well.  She still coughs intermittently throughout the day but is more bothered by this when settling down in bed at night.  This has been a chronic issue for and does seem to improve with as needed albuterol.  She has never tried taking albuterol prophylactically prior to lying down for bed.  She has some degree of chronic dyspnea on exertion.  She denies chest pain, palpitations, PND,  orthopnea, dizziness, syncope, or early satiety.  She sometimes notes mild swelling in her ankles at the end of the day.  Home Medications    Current Outpatient Medications  Medication Sig Dispense Refill   acetaminophen (TYLENOL) 500 MG tablet Take 500 mg by mouth every 6 (six) hours as needed.     albuterol (VENTOLIN HFA) 108 (90 Base) MCG/ACT inhaler INHALE 2 PUFFS INTO THE LUNGS EVERY 6 HOURS AS NEEDED FOR WHEEZING OR SHORTNESS OF BREATH 54 g 0   atorvastatin (LIPITOR) 20 MG tablet Take 20 mg by mouth daily.     azelastine (ASTELIN) 0.1 % nasal spray Place 2 sprays into both nostrils 2 (two) times daily. Use in each nostril as directed 30 mL 2   Budeson-Glycopyrrol-Formoterol (BREZTRI AEROSPHERE) 160-9-4.8 MCG/ACT AERO Inhale 2 puffs into the lungs in the morning and at bedtime. 5.9 g 11   Calcium Carbonate-Vitamin D (OSCAL 500/200 D-3 PO) Take 1 tablet by mouth 2 (two) times daily.     cetirizine (ZYRTEC) 10 MG tablet Take 1 tablet (10 mg total) by mouth daily. 90 tablet 3   chlorpheniramine (CHLOR-TRIMETON) 4 MG tablet Take 4 mg by mouth 2 (two) times daily as needed for allergies.     diphenhydramine-acetaminophen (TYLENOL PM) 25-500 MG TABS tablet Take 1 tablet by mouth at bedtime as needed.     furosemide (LASIX) 20 MG tablet Take 20 mg by mouth daily as needed.     hydrochlorothiazide (HYDRODIURIL) 25 MG tablet Take 1 tablet (25 mg) by mouth once daily 90 tablet 3   losartan (COZAAR) 100 MG tablet Take 1 tablet (100 mg) by mouth once daily     montelukast (SINGULAIR) 10 MG tablet Take 10 mg by mouth at bedtime.     Multiple Vitamin (MULTIVITAMIN) tablet Take 1 tablet by mouth daily.     NON FORMULARY CPAP at bedtime.     omeprazole (PRILOSEC) 40 MG capsule TAKE 1 CAPSULE (40 MG TOTAL) BY MOUTH DAILY. 90 capsule 1   Spacer/Aero-Holding Chambers DEVI 1 each by Does not apply route daily. 1 each 0   amoxicillin-clavulanate (AUGMENTIN) 875-125 MG tablet Take 1 tablet by mouth 2 (two)  times daily. (Patient not taking: Reported on 12/20/2022) 10 tablet 0   famotidine (PEPCID) 40 MG tablet Take 1 tablet by mouth at bedtime. (Patient not taking: Reported on 01/24/2023)     methylPREDNISolone (MEDROL DOSEPAK) 4 MG TBPK tablet Use as directed (Patient not taking: Reported on 12/20/2022) 21 each 0   No current facility-administered medications for this visit.     Review of Systems    Chronic dyspnea on exertion with chronic cough, especially worse when lying down in bed.  Mild ankle edema at the end of the day though overall improved since increasing HCTZ.  She denies chest pain, palpitations, PND, orthopnea, dizziness, syncope, or early satiety.  All other systems reviewed and are otherwise negative except as noted above.    Physical Exam  VS:  BP 122/78 (BP Location: Left Arm, Patient Position: Sitting, Cuff Size: Large)   Pulse 74   Ht 5' 3"$  (1.6 m)   Wt 233 lb 9.6 oz (106 kg)   SpO2 96%   BMI 41.38 kg/m  , BMI Body mass index is 41.38 kg/m.     GEN: Well nourished, well developed, in no acute distress. HEENT: normal. Neck: Supple, no JVD, carotid bruits, or masses. Cardiac: RRR, no murmurs, rubs, or gallops. No clubbing, cyanosis, edema.  Radials 2+/PT 2+ and equal bilaterally.  Respiratory:  Respirations regular and unlabored, diminished breath sounds bilaterally. GI: Soft, nontender, nondistended, BS + x 4. MS: no deformity or atrophy. Skin: warm and dry, no rash. Neuro:  Strength and sensation are intact. Psych: Normal affect.  Accessory Clinical Findings    Lab Results  Component Value Date   CREATININE 0.85 12/27/2022   BUN 14 12/27/2022   NA 136 12/27/2022   K 3.6 12/27/2022   CL 101 12/27/2022   CO2 27 12/27/2022   Labs dated January 16, 2023 from [facility] Everywhere:  Hemoglobin 13.6, hematocrit 42.4, WBC 10.3, platelets 426 Sodium 141, potassium 4.6, chloride 101, CO2 30.6, BUN 13, creatinine 0.8, glucose 89 Calcium 10.5, total protein 7.2,  albumin 4.5 Total bilirubin 0.5, alkaline phosphatase 105, AST 21, ALT 19 Hemoglobin A1c 5.9 Total cholesterol 207, triglycerides 154, HDL 57.5, LDL 119  Assessment & Plan    1.  Essential hypertension: Blood pressure much improved on losartan 100 mg daily and hydrochlorothiazide 25 mg daily.  She continues to have a cough does pronounced upon lying down for bed at night but this does seem to improve with albuterol.  We agreed to continue current medication regimen given improvement in blood pressures and she will try taking prophylactic albuterol prior to lying down for bed.  2.  Chronic HFpEF: Euvolemic on examination.  Heart rate and blood pressure stable.  Continue current doses of losartan/HCTZ.  She has Lasix to be used as needed but has not needed this recently.  3.  Hyperlipidemia: LDL 119 on February 8.  I doubt that this was lower back in 2021, at 87.  She is currently on Lipitor 20 mg daily.  Would really like to see her improve her lifestyle and lose weight prior to titrating statin therapy further.  4.  Obstructive sleep apnea: Compliant with CPAP.  5.  Obstructive lung disease: Notes that she was wheezing earlier in the week but no wheezing today.  Diminished on exam.  Inhaler therapy and Singulair per pulmonology.  6.  Palpitations: This occurred following amlodipine initiation, which was subsequently discontinued.  No recurrence.  7.  Disposition: Follow-up in 6 months or sooner if necessary.   Murray Hodgkins, NP 01/24/2023, 2:05 PM

## 2023-01-24 NOTE — Patient Instructions (Signed)
Medication Instructions:  Your physician recommends that you continue on your current medications as directed. Please refer to the Current Medication list given to you today.  *If you need a refill on your cardiac medications before your next appointment, please call your pharmacy*   Lab Work: No labs ordered  If you have labs (blood work) drawn today and your tests are completely normal, you will receive your results only by: [place] (if you have MyChart) OR A paper copy in the mail If you have any lab test that is abnormal or we need to change your treatment, we will call you to review the results.   Testing/Procedures: No testing ordered   Follow-Up: At [facility], you and your health needs are our priority.  As part of our continuing mission to provide you with exceptional heart care, we have created designated Provider Care Teams.  These Care Teams include your primary Cardiologist (physician) and Advanced Practice Providers (APPs -  Physician Assistants and Nurse Practitioners) who all work together to provide you with the care you need, when you need it.  We recommend signing up for the patient portal called "MyChart".  Sign up information is provided on this After Visit Summary.  MyChart is used to connect with patients for Virtual Visits (Telemedicine).  Patients are able to view lab/test results, encounter notes, upcoming appointments, etc.  Non-urgent messages can be sent to your provider as well.   To learn more about what you can do with MyChart, go to [email or web address].    Your next appointment:   6 month(s)  Provider:   You may see Nelva Bush, MD or one of the following Advanced Practice Providers on your designated Care Team:   Murray Hodgkins, NP

## 2023-02-05 ENCOUNTER — Encounter: Payer: Self-pay | Admitting: Pulmonary Disease

## 2023-02-25 ENCOUNTER — Ambulatory Visit (INDEPENDENT_AMBULATORY_CARE_PROVIDER_SITE_OTHER): Payer: Medicare Other | Admitting: Pulmonary Disease

## 2023-02-25 VITALS — BP 128/86 | HR 66 | Temp 98.0°F | Ht 63.0 in | Wt 232.4 lb

## 2023-02-25 DIAGNOSIS — R053 Chronic cough: Secondary | ICD-10-CM | POA: Diagnosis not present

## 2023-02-25 DIAGNOSIS — Z6841 Body Mass Index (BMI) 40.0 and over, adult: Secondary | ICD-10-CM

## 2023-02-25 DIAGNOSIS — J4489 Other specified chronic obstructive pulmonary disease: Secondary | ICD-10-CM | POA: Diagnosis not present

## 2023-02-25 DIAGNOSIS — K219 Gastro-esophageal reflux disease without esophagitis: Secondary | ICD-10-CM

## 2023-02-25 DIAGNOSIS — J329 Chronic sinusitis, unspecified: Secondary | ICD-10-CM | POA: Diagnosis not present

## 2023-02-25 DIAGNOSIS — I5189 Other ill-defined heart diseases: Secondary | ICD-10-CM

## 2023-02-25 NOTE — Patient Instructions (Signed)
We are getting a CT of your sinuses given the many recurrences you have of the sinus infection.  They will follow-up in 4 to 6 weeks time at that time I would want to revisit your allergy profile.  It cannot be done now due to the fact that you are on prednisone.  Call sooner should you have any new issues.

## 2023-02-25 NOTE — Progress Notes (Signed)
Subjective:    Patient ID: Rhonda Oconnell, female    DOB: 07/21/1951, 72 y.o.   MRN: [id number] Patient Care Team: Derinda Late, MD as PCP - General (Family Medicine) End, Peeples Gave, MD as PCP - Cardiology (Cardiology) Tyler Pita, MD as Consulting Physician (Pulmonary Disease)  Chief Complaint  Patient presents with   Follow-up    Sinus infection- currently on Prednisone and Doxycyline. SOB with exertion. Wheezing. Cough is better since taking the Doxycyline.   HPI Rhonda Oconnell is a 72 year old former smoker (39 PY) who presents for follow-up on COPD with asthma overlap and chronic cough.  This is a scheduled visit.  She was last seen on 22 October 2022.  At her prior visit she had had some recurrent issues with cough but overall was well compensated.  She does note that Breztri 2 puffs twice a day controls her symptoms of COPD with asthma overlap well.  She states that a few days ago she started to develop a sinus infection again and is currently on prednisone and doxycycline.  This is a recurrent issue for her.  She has obstructive sleep apnea and states that she keeps good hygiene on her CPAP masks and changes them frequently.  Since her sinus infection she has noted some increase in shortness of breath and some wheezing.  Cough has improved with the doxycycline.  She is noted to have a very nasal quality to her speech today.  She has gastroesophageal reflux symptoms however these are well-controlled with Prilosec and Pepcid.  As noted, Aneeka has obstructive sleep apnea.  She shows excellent compliance on compliance report today.  Her compliance is to 97%.  She is set to 11 cmH2O. Residual AHI is 1.1.   Review of Systems A 10 point review of systems was performed and it is as noted above otherwise negative.  Patient Active Problem List   Diagnosis Date Noted   Chronic cough 01/30/2022   Acute exacerbation of COPD with asthma ([place]) 01/16/2022   Acute rhinosinusitis 01/16/2022    Acute tracheobronchitis 01/16/2022   Dyspnea on exertion 04/29/2020   Chronic heart failure with preserved ejection fraction (HFpEF) ([place]) 04/29/2020   Laryngopharyngeal reflux (LPR) 01/04/2019   Acute recurrent sinusitis 01/04/2019   Morbid obesity ([place]) 01/02/2019   Morbid obesity with BMI of 40.0-44.9, adult ([place]) 11/24/2018   Obstructive lung disease ([place]) 09/24/2016   Varicose veins of both lower extremities with pain 09/24/2016   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 2 packs a day at most   Substance Use Topics   Alcohol use: No   Allergies  Allergen Reactions   Iodinated Contrast Media Hives   Iodine Hives   Sulfa Antibiotics Hives   Amlodipine Swelling   Current Meds  Medication Sig   acetaminophen (TYLENOL) 500 MG tablet Take 500 mg by mouth every 6 (six) hours as needed.   albuterol (VENTOLIN HFA) 108 (90 Base) MCG/ACT inhaler INHALE 2 PUFFS INTO THE LUNGS  EVERY 6 HOURS AS NEEDED FOR WHEEZING OR SHORTNESS OF BREATH   atorvastatin (LIPITOR) 20 MG tablet Take 20 mg by mouth daily.   azelastine (ASTELIN) 0.1 % nasal spray Place 2 sprays into both nostrils 2 (two) times daily. Use in each nostril as directed   Budeson-Glycopyrrol-Formoterol (BREZTRI AEROSPHERE) 160-9-4.8 MCG/ACT AERO Inhale 2 puffs into the lungs in the morning and at bedtime.   Calcium Carbonate-Vitamin D (OSCAL 500/200 D-3 PO) Take 1 tablet by mouth 2 (two) times daily.   cetirizine (ZYRTEC) 10 MG tablet Take 1 tablet (10 mg total) by mouth daily.   chlorpheniramine  (CHLOR-TRIMETON) 4 MG tablet Take 4 mg by mouth 2 (two) times daily as needed for allergies.   diphenhydramine-acetaminophen (TYLENOL PM) 25-500 MG TABS tablet Take 1 tablet by mouth at bedtime as needed.   doxycycline (VIBRA-TABS) 100 MG tablet Take 100 mg by mouth 2 (two) times daily.   famotidine (PEPCID) 40 MG tablet Take 1 tablet by mouth at bedtime.   furosemide (LASIX) 20 MG tablet Take 20 mg by mouth daily as needed.   hydrochlorothiazide (HYDRODIURIL) 25 MG tablet Take 1 tablet (25 mg) by mouth once daily   HYDROcodone bit-homatropine (HYCODAN) 5-1.5 MG/5ML syrup Take by mouth. Take 5 mLs by mouth every 6 (six) hours as needed for up to 10 days   losartan (COZAAR) 100 MG tablet Take 1 tablet (100 mg) by mouth once daily   montelukast (SINGULAIR) 10 MG tablet Take 10 mg by mouth at bedtime.   Multiple Vitamin (MULTIVITAMIN) tablet Take 1 tablet by mouth daily.   NON FORMULARY CPAP at bedtime.   omeprazole (PRILOSEC) 40 MG capsule TAKE 1 CAPSULE (40 MG TOTAL) BY MOUTH DAILY.   predniSONE (DELTASONE) 20 MG tablet Take 20 mg by mouth daily.   Spacer/Aero-Holding Chambers DEVI 1 each by Does not apply route daily.   Immunization History  Administered Date(s) Administered   Fluad Quad(high Dose 65+) 09/15/2019, 03/07/2022, 10/22/2022   Influenza Inj Mdck Quad Pf 10/01/2018   Influenza Split 10/31/2015   Influenza-Unspecified 10/20/2017, 10/02/2020   Pneumococcal Conjugate-13 08/29/2016   Pneumococcal Polysaccharide-23 03/22/2011, 10/20/2017   Zoster Recombinat (Shingrix) 01/08/2019, 05/05/2019      Objective:   Physical Exam BP 128/86 (BP Location: Left Arm, Cuff Size: Large)   Pulse 66   Temp 98 F (36.7 C)   Ht 5\' 3"  (1.6 m)   Wt 232 lb 6.4 oz (105.4 kg)   SpO2 98%   BMI 41.17 kg/m   SpO2: 98 % O2 Device: None (Room air)  GENERAL: Morbidly obese woman, no acute respiratory distress, fully ambulatory.  No conversational dyspnea.  Mild hoarseness noted today.  Very nasal  quality to speech. HEAD: Normocephalic, atraumatic. EYES: Pupils equal, round, reactive to light.  No scleral icterus.  Mild periorbital puffiness. MOUTH: Wears dentures.  Oral mucosa moist.  No thrush. NECK: Supple. No thyromegaly. Trachea midline. No JVD.  No adenopathy. PULMONARY: Good air entry bilaterally, few rhonchi noted otherwise, no other adventitious sounds. CARDIOVASCULAR: S1 and S2. Regular rate and rhythm.  Grade 2/6 systolic ejection murmur left sternal border. No change from prior.   GASTROINTESTINAL: Obese, benign. MUSCULOSKELETAL: No joint deformity, no clubbing, no edema. NEUROLOGIC: Fully ambulatory with no gait disturbance noted.  Awake, alert, speech fluent, no overt focal deficits. SKIN: Intact,warm,dry.  On limited exam no rashes. PSYCH: Normal mood and behavior.     Assessment & Plan:     ICD-10-CM   1. Asthma-COPD overlap syndrome  J44.89    Continue Breztri 2 puffs twice a day Continue as needed albuterol    2. Chronic sinusitis, unspecified location  J32.9 CT MAXILLOFACIAL LTD WO CM   Currently on prednisone and doxycycline    3. Chronic cough  R05.3    Multifactorial: Recently improved with antibiotic    4. Laryngopharyngeal reflux (LPR)  K21.9    This issue adds complexity to her management Continue antireflux measures, Pepcid and Prilosec    5. Morbid obesity with BMI of 40.0-44.9, adult ([place])  E66.01    Z68.41    This issue adds complexity to her management She would benefit from weight loss     Orders Placed This Encounter  Procedures   CT MAXILLOFACIAL LTD WO CM    Patient with frequent/recurrent sinus infections leading to chronic cough, hoarseness and recurrent lower respiratory tract infections.    Standing Status:   Future    Standing Expiration Date:   02/25/2024    Order Specific Question:   Preferred imaging location?    Answer:   [facility]   Will evaluate the patient's sinuses given her recurrent issues with the same.  We  have ordered a CT maxillofacial.  Will see the patient in follow-up in 4 to 6 weeks time she is to contact us prior to that time should any new problems arise.  Renold Don, MD Advanced Bronchoscopy PCCM  [facility]    *This note was dictated using voice recognition software/Dragon.  Despite best efforts to proofread, errors can occur which can change the meaning. Any transcriptional errors that result from this process are unintentional and may not be fully corrected at the time of dictation.

## 2023-02-25 NOTE — Progress Notes (Deleted)
   Subjective:    Patient ID: Rhonda Oconnell, female    DOB: 01/28/1951, 72 y.o.   MRN: [id number]  HPI    Review of Systems     Objective:   Physical Exam        Assessment & Plan:

## 2023-02-28 ENCOUNTER — Encounter: Payer: Self-pay | Admitting: Pulmonary Disease

## 2023-03-04 ENCOUNTER — Ambulatory Visit
Admission: RE | Admit: 2023-03-04 | Discharge: 2023-03-04 | Disposition: A | Discharge status: Home / Self Care | Payer: Medicare Other | Source: Ambulatory Visit | Attending: Pulmonary Disease | Admitting: Pulmonary Disease

## 2023-03-04 DIAGNOSIS — J329 Chronic sinusitis, unspecified: Secondary | ICD-10-CM | POA: Diagnosis not present

## 2023-03-06 ENCOUNTER — Telehealth: Payer: Self-pay

## 2023-03-06 NOTE — Telephone Encounter (Signed)
If this is lingering for her she should touch base with her ENT.  They may need to do a culture of her sinuses.

## 2023-03-06 NOTE — Telephone Encounter (Signed)
Patient is aware of recommendations and voiced her understanding.  She will contact ENT. Nothing further needed.

## 2023-03-06 NOTE — Telephone Encounter (Signed)
Spoke to patient.  She feels that sinus infection is lingering.  C/o nasal drainage, runny nose, headache and sinus pressure x3w She completed abx on Wednesday and prednisone Saturday.  She would like a call back on [phone].  Dr. Patsey Berthold, please advise. Thanks

## 2023-04-14 ENCOUNTER — Ambulatory Visit (INDEPENDENT_AMBULATORY_CARE_PROVIDER_SITE_OTHER): Payer: Medicare Other | Admitting: Pulmonary Disease

## 2023-04-14 ENCOUNTER — Encounter: Payer: Self-pay | Admitting: Pulmonary Disease

## 2023-04-14 VITALS — BP 122/80 | HR 73 | Temp 97.1°F | Ht 63.0 in | Wt 232.2 lb

## 2023-04-14 DIAGNOSIS — J329 Chronic sinusitis, unspecified: Secondary | ICD-10-CM

## 2023-04-14 DIAGNOSIS — G4733 Obstructive sleep apnea (adult) (pediatric): Secondary | ICD-10-CM

## 2023-04-14 DIAGNOSIS — J4489 Other specified chronic obstructive pulmonary disease: Secondary | ICD-10-CM | POA: Diagnosis not present

## 2023-04-14 DIAGNOSIS — Z6841 Body Mass Index (BMI) 40.0 and over, adult: Secondary | ICD-10-CM

## 2023-04-14 MED ORDER — BREZTRI AEROSPHERE 160-9-4.8 MCG/ACT IN AERO: 2.0000 | INHALATION_SPRAY | Freq: Two times a day (BID) | RESPIRATORY_TRACT | 0 refills | Status: DC

## 2023-04-14 NOTE — Progress Notes (Signed)
Subjective:    Patient ID: Rhonda Oconnell, female    DOB: 10-Nov-1951, 72 y.o.   MRN: [id number]  Patient Care Team: Kandyce Rud, MD as PCP - General (Family Medicine) End, Cristal Deer, MD as PCP - Cardiology (Cardiology) Salena Saner, MD as Consulting Physician (Pulmonary Disease)  Chief Complaint  Patient presents with   Follow-up    SOB with exertion. Cough with clear sputum. No wheezing.    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  This is a scheduled visit.  She was last seen on 25 February 2023.  At her prior visit she had had some recurrent issues with sinusitis and was taking prednisone and doxycycline prescribed by ENT.  We ordered a CT scan of the sinuses that showed trace fluid in the left maxillary and bilateral sphenoid sinuses and mild mucosal thickening of the right central sinus.  The sinus drainage pathways were patent.  She has resolved that issue since she completed the therapy.  She does note that Breztri 2 puffs twice a day controls her symptoms of COPD with asthma overlap well.  She has obstructive sleep apnea and states that she keeps good hygiene on her CPAP masks and changes them frequently.  Since she cleared her sinus infection she has not had any wheezing.  No cough.She has gastroesophageal reflux symptoms however, these are well-controlled with Prilosec and Pepcid.   As noted, Rhonda Oconnell has obstructive sleep apnea.  She shows excellent compliance on compliance report today.  Her compliance is to 97%.  She is set to 11 cmH2O. Residual AHI is 1.1.  She has had some issues with her CPAP mask leaking and she is to reach out to [facility] to have a new one provided.    Review of Systems A 10 point review of systems was performed and it is as noted above otherwise negative.  Patient Active Problem List   Diagnosis Date Noted   Chronic cough 01/30/2022   Acute exacerbation of COPD with  asthma (HCC) 01/16/2022   Acute rhinosinusitis 01/16/2022   Acute tracheobronchitis 01/16/2022   Dyspnea on exertion 04/29/2020   Chronic heart failure with preserved ejection fraction (HFpEF) (HCC) 04/29/2020   Laryngopharyngeal reflux (LPR) 01/04/2019   Acute recurrent sinusitis 01/04/2019   Morbid obesity (HCC) 01/02/2019   Morbid obesity with BMI of 40.0-44.9, adult (HCC) 11/24/2018   Obstructive lung disease (HCC) 09/24/2016   Varicose veins of both lower extremities with pain 09/24/2016   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   albuterol (VENTOLIN HFA) 108 (90 Base) MCG/ACT inhaler INHALE 2 PUFFS INTO THE LUNGS EVERY 6 HOURS AS NEEDED FOR WHEEZING OR SHORTNESS OF BREATH   atorvastatin (LIPITOR) 20 MG tablet Take 20 mg by mouth daily.   azelastine (ASTELIN) 0.1 % nasal spray Place 2 sprays into both nostrils 2 (two) times daily. Use in each nostril as directed   Budeson-Glycopyrrol-Formoterol (BREZTRI AEROSPHERE) 160-9-4.8 MCG/ACT AERO Inhale 2 puffs into the lungs in the morning and at bedtime.   Calcium  Carbonate-Vitamin D (OSCAL 500/200 D-3 PO) Take 1 tablet by mouth 2 (two) times daily.   cetirizine (ZYRTEC) 10 MG tablet Take 1 tablet (10 mg total) by mouth daily.   chlorpheniramine (CHLOR-TRIMETON) 4 MG tablet Take 4 mg by mouth 2 (two) times daily as needed for allergies.   diphenhydramine-acetaminophen (TYLENOL PM) 25-500 MG TABS tablet Take 1 tablet by mouth at bedtime as needed.   famotidine (PEPCID) 40 MG tablet Take 1 tablet by mouth at bedtime.   furosemide (LASIX) 20 MG tablet Take  20 mg by mouth daily as needed.   hydrochlorothiazide (HYDRODIURIL) 25 MG tablet Take 1 tablet (25 mg) by mouth once daily   losartan (COZAAR) 100 MG tablet Take 1 tablet (100 mg) by mouth once daily   montelukast (SINGULAIR) 10 MG tablet Take 10 mg by mouth at bedtime.   Multiple Vitamin (MULTIVITAMIN) tablet Take 1 tablet by mouth daily.   NON FORMULARY CPAP at bedtime.   omeprazole (PRILOSEC) 40 MG capsule TAKE 1 CAPSULE (40 MG TOTAL) BY MOUTH DAILY.   Spacer/Aero-Holding Chambers DEVI 1 each by Does not apply route daily.   Immunization History  Administered Date(s) Administered   Fluad Quad(high Dose 65+) 09/15/2019, 03/07/2022, 10/22/2022   Influenza Inj Mdck Quad Pf 10/01/2018   Influenza Split 10/31/2015   Influenza-Unspecified 10/20/2017, 10/02/2020   Pneumococcal Conjugate-13 08/29/2016   Pneumococcal Polysaccharide-23 03/22/2011, 10/20/2017   Zoster Recombinat (Shingrix) 01/08/2019, 05/05/2019       Objective:   Physical Exam BP 122/80 (BP Location: Left Arm, Cuff Size: Large)   Pulse 73   Temp (!) 97.1 F (36.2 C)   Ht 5\' 3"  (1.6 m)   Wt 232 lb 3.2 oz (105.3 kg)   SpO2 97%   BMI 41.13 kg/m   SpO2: 97 % O2 Device: None (Room air)  GENERAL: Morbidly obese woman, no acute respiratory distress, fully ambulatory.  No conversational dyspnea.  HEAD: Normocephalic, atraumatic. EYES: Pupils equal, round, reactive to light.  No scleral icterus. MOUTH: Wears dentures.  Oral mucosa moist.  No thrush. NECK: Supple. No thyromegaly. Trachea midline. No JVD.  No adenopathy. PULMONARY: Good air entry bilaterally, few rhonchi noted otherwise, no other adventitious sounds. CARDIOVASCULAR: S1 and S2. Regular rate and rhythm.  Grade 2/6 systolic ejection murmur left sternal border. No change from prior.   GASTROINTESTINAL: Obese, benign. MUSCULOSKELETAL: No joint deformity, no clubbing, no edema. NEUROLOGIC: Fully ambulatory with no gait disturbance noted.  Awake, alert, speech  fluent, no overt focal deficits. SKIN: Intact,warm,dry.  On limited exam no rashes. PSYCH: Normal mood and behavior.      Assessment & Plan:     ICD-10-CM   1. 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 OSA on CPAP  G47.33    Compliant with therapy On CPAP 11cmH2O, continue same    4. Morbid obesity with BMI of 40.0-44.9, adult (HCC)  E66.01    Z68.41    Would benefit from weight loss     Meds ordered this encounter  Medications   Budeson-Glycopyrrol-Formoterol (BREZTRI AEROSPHERE) 160-9-4.8 MCG/ACT AERO    Sig: Inhale 2 puffs into the lungs in the morning and at bedtime.    Dispense:  11.8 g    Refill:  0    Order Specific Question:   Lot Number?  Answer:   [id number]    Order Specific Question:   Expiration Date?    Answer:   07/09/2025    Order Specific Question:   Manufacturer?    Answer:   [employer] [[id number]]    Order Specific Question:   Quantity    Answer:   2   Patient appears to be well compensated.  Will see the patient in follow-up in 3 to 4 months time call sooner should any new issues arise.  Gailen Shelter, MD Advanced Bronchoscopy PCCM [facility]    *This note was dictated using voice recognition software/Dragon.  Despite best efforts to proofread, errors can occur which can change the meaning. Any transcriptional errors that result from this process are unintentional and may not be fully corrected at the time of dictation.

## 2023-04-14 NOTE — Patient Instructions (Signed)
Your lungs sounded really clear today.  Your compliance with the CPAP is very good.  We have provided you with some Breztri samples.  We will see you in follow-up in 3 to 4 months time call sooner should any new difficulties arise.

## 2023-05-06 ENCOUNTER — Encounter: Payer: Self-pay | Admitting: Pulmonary Disease

## 2023-07-01 ENCOUNTER — Telehealth: Payer: Self-pay | Admitting: Pulmonary Disease

## 2023-07-01 MED ORDER — OMEPRAZOLE 40 MG PO CPDR: 40.0000 mg | DELAYED_RELEASE_CAPSULE | Freq: Every day | ORAL | 1 refills | Status: DC

## 2023-07-01 NOTE — Telephone Encounter (Signed)
Prilosec sent to preferred pharmacy.  Patient is aware and voiced her understanding.  Nothing further needed.

## 2023-07-01 NOTE — Telephone Encounter (Signed)
Omeprazole needs refill  [facility] [address]  States Pharm sent in req and we have not filled it yet. I asked her to be patient.

## 2023-09-15 ENCOUNTER — Other Ambulatory Visit: Payer: Self-pay

## 2023-09-15 DIAGNOSIS — J4489 Other specified chronic obstructive pulmonary disease: Secondary | ICD-10-CM

## 2023-09-15 MED ORDER — BREZTRI AEROSPHERE 160-9-4.8 MCG/ACT IN AERO: 2.0000 | INHALATION_SPRAY | Freq: Two times a day (BID) | RESPIRATORY_TRACT | 3 refills | Status: DC

## 2023-09-23 HISTORY — PX: SKIN CANCER EXCISION: SHX779

## 2023-09-25 ENCOUNTER — Ambulatory Visit: Payer: Medicare Other | Admitting: Pulmonary Disease

## 2023-09-25 ENCOUNTER — Encounter: Payer: Self-pay | Admitting: Pulmonary Disease

## 2023-09-25 VITALS — BP 120/86 | HR 107 | Temp 98.2°F | Ht 63.0 in | Wt 232.6 lb

## 2023-09-25 DIAGNOSIS — R0602 Shortness of breath: Secondary | ICD-10-CM

## 2023-09-25 DIAGNOSIS — R053 Chronic cough: Secondary | ICD-10-CM

## 2023-09-25 DIAGNOSIS — G4733 Obstructive sleep apnea (adult) (pediatric): Secondary | ICD-10-CM | POA: Diagnosis not present

## 2023-09-25 DIAGNOSIS — J4489 Other specified chronic obstructive pulmonary disease: Secondary | ICD-10-CM

## 2023-09-25 DIAGNOSIS — Z6841 Body Mass Index (BMI) 40.0 and over, adult: Secondary | ICD-10-CM

## 2023-09-25 LAB — NITRIC OXIDE: Nitric Oxide: 10

## 2023-09-25 NOTE — Progress Notes (Signed)
Subjective:    Patient ID: Rhonda Oconnell, female    DOB: 07/03/1951, 72 y.o.   MRN: [id number]  Patient Care Team: Kandyce Rud, MD as PCP - General (Family Medicine) End, Cristal Deer, MD as PCP - Cardiology (Cardiology) Salena Saner, MD as Consulting Physician (Pulmonary Disease)  Chief Complaint  Patient presents with   Follow-up    SOB. Some wheezing. Cough with clear to yellow sputum. Symptoms for 1.5 weeks.   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  This is a scheduled visit.  She was last seen on 14 Apr 2023.   HPI Discussed the use of AI scribe software for clinical note transcription with the patient, who gave verbal consent to proceed.  History of Present Illness   The patient, with a history of asthmatic bronchitis, chronic rhinosinusitis, morbid obesity, and obstructive sleep apnea, presents with a worsening cough over the past week and a half. She describes the cough as persistent and accompanied by wheezing. She also reports an adverse reaction to Aleve, which she had been taking for knee pain. The Aleve seemed to exacerbate her cough, so she discontinued its use. She has also tried ibuprofen with similar results.  The patient's current medication, Breztri, appears to be effective, but the cough persists, particularly at night. She denies any symptoms of heartburn. She is currently on Prilosec 40 and Pepcid at night for reflux. She also takes Cozaar for blood pressure control, but it is suspected that this may be contributing to her cough.  The patient uses a CPAP machine for sleep apnea and generally tolerates it well. However, she has had a couple of instances where she started coughing after lying down with the CPAP on, leading to a feeling of suffocation and panic. She had to remove the CPAP during these episodes.  The patient also reports difficulty with mobility due  to knee pain, which is exacerbated by walking. She expresses frustration at her inability to take anything to alleviate the pain due to her adverse reactions to NSAIDs. She is aware that her weight is a factor in her knee pain and potential for surgery, but struggles with weight loss due to her limited mobility.  The patient denies any leg swelling unless she goes to the beach. She also reports shortness of breath after walking up a hill and exposure to strong deodorizer smells. She has not participated in pulmonary rehab. She feels she would not be able to participate in rehab due to her limited mobility. She is contemplating getting a flu shot at her pharmacy. She would like to "think about it".   Compliance with CPAP is stellar at 100%.  She is on CPAP at 11 cm H2O.  Residual AHI 1.8 with therapy.  Very well compensated.    Review of Systems A 10 point review of systems was performed and it is as noted above otherwise negative.   Patient Active Problem List   Diagnosis Date Noted   Chronic cough 01/30/2022   Acute exacerbation of COPD with asthma (HCC) 01/16/2022   Acute rhinosinusitis 01/16/2022   Acute tracheobronchitis 01/16/2022   Dyspnea on exertion 04/29/2020   Chronic heart failure with preserved ejection fraction (HFpEF) (HCC) 04/29/2020   Laryngopharyngeal reflux (LPR) 01/04/2019   Acute recurrent sinusitis 01/04/2019   Morbid obesity (HCC) 01/02/2019   Morbid obesity with BMI of 40.0-44.9, adult (HCC) 11/24/2018   Obstructive lung disease (HCC) 09/24/2016  Varicose veins of both lower extremities with pain 09/24/2016   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 2 packs a day at most   Substance Use Topics   Alcohol use: No   Allergies  Allergen Reactions   Iodinated Contrast Media Hives   Iodine Hives   Sulfa Antibiotics Hives   Amlodipine Swelling   Current Meds  Medication Sig   acetaminophen (TYLENOL) 500 MG tablet Take 500 mg by mouth every 6 (six) hours as needed.   albuterol (VENTOLIN HFA) 108 (90 Base) MCG/ACT inhaler INHALE 2 PUFFS INTO THE LUNGS EVERY 6 HOURS AS NEEDED FOR WHEEZING OR SHORTNESS OF BREATH   atorvastatin (LIPITOR) 20 MG tablet Take 20 mg by mouth daily.   azelastine (ASTELIN) 0.1 % nasal spray Place 2 sprays into both nostrils 2 (two) times daily. Use in each nostril as directed   Budeson-Glycopyrrol-Formoterol (BREZTRI AEROSPHERE) 160-9-4.8 MCG/ACT AERO Inhale 2 puffs into the lungs in the morning and at bedtime.   Calcium Carbonate-Vitamin D (OSCAL 500/200 D-3 PO) Take 1 tablet by mouth 2 (two) times daily.   cetirizine (ZYRTEC) 10 MG tablet Take 1 tablet (10 mg total) by mouth daily.   chlorpheniramine (CHLOR-TRIMETON) 4 MG tablet Take 4 mg by mouth 2 (two) times daily as needed for allergies.   diphenhydramine-acetaminophen (TYLENOL PM) 25-500 MG TABS tablet Take 1 tablet by mouth at bedtime as needed.   famotidine (PEPCID) 40 MG tablet Take 1 tablet by mouth at bedtime.   furosemide (LASIX) 20 MG tablet Take 20 mg by mouth daily as needed.   hydrochlorothiazide (HYDRODIURIL) 25 MG tablet Take 1 tablet (25 mg) by mouth once daily   losartan (COZAAR) 100 MG tablet Take 1 tablet (100 mg) by mouth once daily   meloxicam (MOBIC) 15 MG tablet Take 1 tablet by mouth daily as needed.   montelukast (SINGULAIR) 10 MG tablet Take 10 mg by mouth at bedtime.   Multiple Vitamin (MULTIVITAMIN) tablet Take 1 tablet by mouth daily.   NON FORMULARY CPAP at bedtime.   omeprazole  (PRILOSEC) 40 MG capsule Take 1 capsule (40 mg total) by mouth daily.   Spacer/Aero-Holding Chambers DEVI 1 each by Does not apply route daily.   Immunization History  Administered Date(s) Administered   Fluad Quad(high Dose 65+) 09/15/2019, 03/07/2022, 10/22/2022   Influenza Inj Mdck Quad Pf 10/01/2018   Influenza Split 10/31/2015   Influenza-Unspecified 10/20/2017, 10/02/2020   Pneumococcal Conjugate-13 08/29/2016   Pneumococcal Polysaccharide-23 03/22/2011, 10/20/2017   Zoster Recombinant(Shingrix) 01/08/2019, 05/05/2019       Objective:   BP 120/86 (BP Location: Right Arm, Cuff Size: Large)   Pulse (!) 107   Temp 98.2 F (36.8 C)   Ht 5\' 3"  (1.6 m)   Wt 232 lb 9.6 oz (105.5 kg)   SpO2 97%   BMI 41.20 kg/m   SpO2: 97 % O2 Device: None (Room air)  GENERAL: Morbidly obese woman, no acute respiratory distress, fully  ambulatory.  No conversational dyspnea.  HEAD: Normocephalic, atraumatic. EYES: Pupils equal, round, reactive to light.  No scleral icterus. MOUTH: Wears dentures.  Oral mucosa moist.  No thrush. NECK: Supple. No thyromegaly. Trachea midline. No JVD.  No adenopathy. PULMONARY: Good air entry bilaterally, few rhonchi noted otherwise, no other adventitious sounds. CARDIOVASCULAR: S1 and S2. Regular rate and rhythm.  Grade 2/6 systolic ejection murmur left sternal border. No change from prior.   GASTROINTESTINAL: Obese, benign. MUSCULOSKELETAL: No joint deformity, no clubbing, no edema. NEUROLOGIC: Fully ambulatory with no gait disturbance noted.  Awake, alert, speech fluent, no overt focal deficits. SKIN: Intact,warm,dry.  On limited exam no rashes. PSYCH: Normal mood and behavior.     Assessment & Plan:     ICD-10-CM   1. Asthma-COPD overlap syndrome (HCC)  J44.89 Nitric oxide    Pulmonary Function Test [facility] Only    2. Chronic cough  R05.3     3. SOB (shortness of breath)  R06.02 Pulmonary Function Test [facility] Only    4. OSA on CPAP  G47.33     5.  Morbid obesity with BMI of 40.0-44.9, adult (HCC)  E66.01    Z68.41      Orders Placed This Encounter  Procedures   Nitric oxide   Pulmonary Function Test [facility] Only    Standing Status:   Future    Standing Expiration Date:   09/24/2024    Order Specific Question:   Full PFT: includes the following: basic spirometry, spirometry pre & post bronchodilator, diffusion capacity (DLCO), lung volumes    Answer:   Full PFT    Order Specific Question:   This test can only be performed at    Answer:   [facility]   Assessment and Plan    Asthmatic Bronchitis Increased coughing and wheezing. Noted worsening symptoms with Aleve use due to NSAID sensitivity. Breztri still effective. -Avoid NSAIDs due to asthma exacerbation risk. -Continue Breztri.  Gastroesophageal Reflux Disease (GERD) Chronic cough potentially exacerbated by GERD despite Prilosec 40mg  and Pepcid. Query ARB? -ARBs still carry a potential 10% overlap with ACE inhibitors and may also cause cough.  However the least offender in this class is Benicar.  Would recommend consideration switching to Benicar. -Consider referral to gastroenterology for further evaluation.  Hypertension Currently on Losartan (Cozaar), which may contribute to cough. -Recommend switching to Benicar. Will communicate with primary care provider regarding this change.  Osteoarthritis Chronic knee pain managed with NSAIDs, which have been discontinued due to asthma exacerbation. -Recommend Arthritis Strength Tylenol for pain management.  Obstructive Sleep Apnea (OSA) Well managed with CPAP. Occasional episodes of coughing and feeling of suffocation. -Continue CPAP use.  Morbid Obesity Weight loss needed for potential knee surgery. -Encourage continued weight loss efforts.  General Health Maintenance -Order pulmonary function tests to assess for COPD progression. -Consider pulmonary rehabilitation. -Encourage annual flu vaccination. -Follow-up  in 3 months or sooner if new difficulties arise.       Gailen Shelter, MD Advanced Bronchoscopy PCCM [facility]    *This note was dictated using voice recognition software/Dragon.  Despite best efforts to proofread, errors can occur which can change the meaning. Any transcriptional errors that result from this process are unintentional and may not be fully corrected at the time of dictation.

## 2023-09-25 NOTE — Patient Instructions (Signed)
VISIT SUMMARY:  During your visit, we discussed your ongoing issues with asthmatic bronchitis, gastroesophageal reflux disease (GERD), hypertension, osteoarthritis, obstructive sleep apnea (OSA), and morbid obesity. We also discussed your adverse reactions to certain medications and your difficulty with mobility due to knee pain.  YOUR PLAN:  -ASTHMATIC BRONCHITIS: Your cough and wheezing have worsened, possibly due to your sensitivity to NSAIDs. You should continue using Breztri, which seems to be effective, and avoid NSAIDs to reduce the risk of worsening your asthma.  -GASTROESOPHAGEAL REFLUX DISEASE (GERD): Your chronic cough may be worsened by GERD, even though you're taking Prilosec and Pepcid. We're considering referring you to a gastroenterologist for further evaluation.  -HYPERTENSION: Your current blood pressure medication, Cozaar, may be contributing to your cough. We recommend switching to Benicar and will communicate with your primary care provider about this change.  -OSTEOARTHRITIS: Your chronic knee pain has been managed with NSAIDs, which you've had to discontinue due to asthma exacerbation. We recommend Arthritis Strength Tylenol for pain management.  -OBSTRUCTIVE SLEEP APNEA (OSA): Your sleep apnea is generally well managed with your CPAP machine. Continue using it, even though you've had a few episodes of coughing.  -MORBID OBESITY: You need to lose weight for potential knee surgery. We encourage you to continue your efforts to lose weight.  INSTRUCTIONS:  For your general health, we're ordering pulmonary function tests to check for any progression of COPD. We're also considering pulmonary rehabilitation for you. We encourage you to get your annual flu vaccination. Please follow up in 3 months or sooner if you experience new difficulties.

## 2023-10-20 ENCOUNTER — Telehealth: Payer: Self-pay | Admitting: *Deleted

## 2023-10-20 ENCOUNTER — Telehealth: Payer: Self-pay | Admitting: Internal Medicine

## 2023-10-20 NOTE — Telephone Encounter (Signed)
   Pre-operative Risk Assessment    Patient Name: Rhonda Oconnell  DOB: [birth year] MRN: [id number]      Request for Surgical Clearance    Procedure:   total left knee arthroplasty  Date of Surgery:  Clearance TBD                                 Surgeon:  Francisco Capuchin Jr.,MD Surgeon's Group or Practice Name:  [facility] Phone number:  [phone] Fax number:  [phone]   Type of Clearance Requested:   - Medical    Type of Anesthesia:  Not Indicated   Additional requests/questions:    [place]   10/20/2023, 8:27 AM

## 2023-10-20 NOTE — Telephone Encounter (Signed)
   Name: Rhonda Oconnell  DOB: 08-09-1951  MRN: [id number]  Primary Cardiologist: Yvonne Kendall, MD   Preoperative team, please contact this patient and set up a phone call appointment for further preoperative risk assessment. Please obtain consent and complete medication review. Thank you for your help.  I confirm that guidance regarding antiplatelet and oral anticoagulation therapy has been completed and, if necessary, noted below.  None requested.   I also confirmed the patient resides in the state of [place]. As per [facility] Medical Board telemedicine laws, the patient must reside in the state in which the provider is licensed.   Carlos Levering, NP 10/20/2023, 8:35 AM [facility]

## 2023-10-20 NOTE — Telephone Encounter (Signed)
Pt has been scheduled tele pre op appt 12/01/23. Pt states surgery being planned for early Jan 2025. Med rec and consent are done.

## 2023-10-20 NOTE — Telephone Encounter (Signed)
Pt has been scheduled tele pre op appt 12/01/23. Pt states surgery being planned for early Jan 2025. Med rec and consent are done.     Patient Consent for Virtual Visit        Rhonda Oconnell has provided verbal consent on 10/20/2023 for a virtual visit (video or telephone).   CONSENT FOR VIRTUAL VISIT FOR:  Rhonda Oconnell  By participating in this virtual visit I agree to the following:  I hereby voluntarily request, consent and authorize [facility] and its employed or contracted physicians, physician assistants, nurse practitioners or other licensed health care professionals (the Practitioner), to provide me with telemedicine health care services (the "Services") as deemed necessary by the treating Practitioner. I acknowledge and consent to receive the Services by the Practitioner via telemedicine. I understand that the telemedicine visit will involve communicating with the Practitioner through live audiovisual communication technology and the disclosure of certain medical information by electronic transmission. I acknowledge that I have been given the opportunity to request an in-person assessment or other available alternative prior to the telemedicine visit and am voluntarily participating in the telemedicine visit.  I understand that I have the right to withhold or withdraw my consent to the use of telemedicine in the course of my care at any time, without affecting my right to future care or treatment, and that the Practitioner or I may terminate the telemedicine visit at any time. I understand that I have the right to inspect all information obtained and/or recorded in the course of the telemedicine visit and may receive copies of available information for a reasonable fee.  I understand that some of the potential risks of receiving the Services via telemedicine include:  Delay or interruption in medical evaluation due to technological equipment failure or disruption; Information  transmitted may not be sufficient (e.g. poor resolution of images) to allow for appropriate medical decision making by the Practitioner; and/or  In rare instances, security protocols could fail, causing a breach of personal health information.  Furthermore, I acknowledge that it is my responsibility to provide information about my medical history, conditions and care that is complete and accurate to the best of my ability. I acknowledge that Practitioner's advice, recommendations, and/or decision may be based on factors not within their control, such as incomplete or inaccurate data provided by me or distortions of diagnostic images or specimens that may result from electronic transmissions. I understand that the practice of medicine is not an exact science and that Practitioner makes no warranties or guarantees regarding treatment outcomes. I acknowledge that a copy of this consent can be made available to me via my patient portal [facility] MyChart), or I can request a printed copy by calling the office of [facility].    I understand that my insurance will be billed for this visit.   I have read or had this consent read to me. I understand the contents of this consent, which adequately explains the benefits and risks of the Services being provided via telemedicine.  I have been provided ample opportunity to ask questions regarding this consent and the Services and have had my questions answered to my satisfaction. I give my informed consent for the services to be provided through the use of telemedicine in my medical care

## 2023-10-22 ENCOUNTER — Encounter: Payer: Self-pay | Admitting: Pulmonary Disease

## 2023-10-27 ENCOUNTER — Telehealth: Payer: Self-pay

## 2023-10-27 NOTE — Telephone Encounter (Signed)
Received a fax for surgical clearance on the patient. Patient scheduled to have a Left total knee arthroplasty on 12/30/2022.  Per Dr. Jayme Cloud, patient will need an office visit in order to give the patient clearance.  Synetta Fail- order for PFT is in the patient's chart. Can you schedule her in the next available appt and let me know so I can get her in right after that for a surgical clearance? Thank you!

## 2023-11-04 ENCOUNTER — Telehealth: Payer: Self-pay | Admitting: Pulmonary Disease

## 2023-11-04 NOTE — Telephone Encounter (Signed)
I have no appts to schedule PFT yet. We are still waiting for them to give [place] Decembers PFT appts. Normally they give them to [place] 2 weeks before the new month starts.

## 2023-11-04 NOTE — Telephone Encounter (Signed)
She wasn't suppose to return for 3 months but I will try and get her PFT scheduled in December when I get Decembers PFT appts

## 2023-11-04 NOTE — Telephone Encounter (Signed)
Needs Pft schedule for [facility]

## 2023-11-12 NOTE — Telephone Encounter (Signed)
See telephone encounter from 11/04/23.  Closing this encounter.

## 2023-11-20 NOTE — Telephone Encounter (Signed)
I have spoke with Rhonda Oconnell and her PFT has been scheduled on 12/11/2023 @ 2:00pm at [facility] Entrance

## 2023-12-01 ENCOUNTER — Ambulatory Visit: Payer: Medicare Other | Attending: Cardiology | Admitting: General Practice

## 2023-12-01 DIAGNOSIS — Z01810 Encounter for preprocedural cardiovascular examination: Secondary | ICD-10-CM | POA: Diagnosis not present

## 2023-12-01 NOTE — Progress Notes (Signed)
Virtual Visit via Telephone Note   Because of Rhonda Oconnell's co-morbid illnesses, she is at least at moderate risk for complications without adequate follow up.  This format is felt to be most appropriate for this patient at this time.  The patient did not have access to video technology/had technical difficulties with video requiring transitioning to audio format only (telephone).  All issues noted in this document were discussed and addressed.  No physical exam could be performed with this format.  Please refer to the patient's chart for her consent to telehealth for [facility].  Evaluation Performed:  Preoperative cardiovascular risk assessment _____________   Date:  12/01/2023   Patient ID:  Rhonda Oconnell, DOB 12/29/1950, MRN [id number] Patient Location:  Home Provider location:   Office  Primary Care Provider:  Kandyce Rud, MD Primary Cardiologist:  Yvonne Kendall, MD  Chief Complaint / Patient Profile   72 y.o. y/o female with a h/o chronic HEpEF, HTN, HLD, COPD who is pending total left knee arthyroplasty and presents today for telephonic preoperative cardiovascular risk assessment.  History of Present Illness    Rhonda Oconnell is a 72 y.o. female who presents via audio/video conferencing for a telehealth visit today.  Pt was last seen in cardiology clinic on 01/24/23 by  Ward Givens, NP.  At that time Rhonda Oconnell was doing well.  The patient is now pending procedure as outlined above. Since her last visit, she remains stable from a cardiac standpoint.   Today she denies chest pain, shortness of breath, lower extremity edema, fatigue, palpitations, melena, hematuria, hemoptysis, diaphoresis, weakness, presyncope, syncope, orthopnea, and PND.   Past Medical History    Past Medical History:  Diagnosis Date   Abdominal hernia    Allergic state    Anemia    Arthritis    Asthma    Cataract cortical, senile    Chest pain    a. 04/2013 MV: Nl EF, no  isch/infarct ; b. 05/2020 MV: EF 55-65%, no isch/infarct.   Chronic heart failure with preserved ejection fraction (HFpEF) (HCC)    a. 03/2020 Echo: EF 55-60%, no rwma, GrII DD, mild Ao sclerosis; b. 02/2022 Echo: EF 60-65%, no rwma, GrI DD, nl RV fxn, Ao sclerosis w/o stenosis.   COPD (chronic obstructive pulmonary disease) (HCC)    Dysphagia    GERD (gastroesophageal reflux disease)    Hemorrhoids    Hyperlipidemia    Hypertension    Rectal tear    Rectocele    Past Surgical History:  Procedure Laterality Date   ABDOMINAL HYSTERECTOMY     ARTHOSCOPIC ROTAOR CUFF REPAIR     BASAL CELL CARCINOMA EXCISION  10/23/2022   BLADDER REPAIR     CATARACT EXTRACTION     CHOLECYSTECTOMY     COLONOSCOPY WITH PROPOFOL N/A 07/22/2016   Procedure: COLONOSCOPY WITH PROPOFOL;  Surgeon: Scot Jun, MD;  Location: [facility] ENDOSCOPY;  Service: Endoscopy;  Laterality: N/A;   ESOPHAGOGASTRODUODENOSCOPY (EGD) WITH PROPOFOL N/A 07/22/2016   Procedure: ESOPHAGOGASTRODUODENOSCOPY (EGD) WITH PROPOFOL;  Surgeon: Scot Jun, MD;  Location: [facility] ENDOSCOPY;  Service: Endoscopy;  Laterality: N/A;   HEMORRHOID SURGERY     HERNIA REPAIR     LAPAROSCOPIC SPLENECTOMY     SKIN CANCER EXCISION     basal cell carcinoma removed from forehead   SKIN CANCER EXCISION Right 09/23/2023   Right Shoulder   TUBAL LIGATION      Allergies  Allergies  Allergen Reactions  Iodinated Contrast Media Hives   Iodine Hives   Sulfa Antibiotics Hives   Nsaids Cough   Amlodipine Swelling    Home Medications    Prior to Admission medications   Medication Sig Start Date End Date Taking? Authorizing Provider  acetaminophen (TYLENOL) 500 MG tablet Take 500 mg by mouth every 6 (six) hours as needed.    [provider]  albuterol (VENTOLIN HFA) 108 (90 Base) MCG/ACT inhaler INHALE 2 PUFFS INTO THE LUNGS EVERY 6 HOURS AS NEEDED FOR WHEEZING OR SHORTNESS OF BREATH 10/31/21   Salena Saner, MD  atorvastatin  (LIPITOR) 20 MG tablet Take 20 mg by mouth daily.    [provider]  azelastine (ASTELIN) 0.1 % nasal spray Place 2 sprays into both nostrils 2 (two) times daily. Use in each nostril as directed 01/16/22   Cobb, Ruby Cola, NP  Budeson-Glycopyrrol-Formoterol (BREZTRI AEROSPHERE) 160-9-4.8 MCG/ACT AERO Inhale 2 puffs into the lungs in the morning and at bedtime. 09/15/23   Salena Saner, MD  Calcium Carbonate-Vitamin D (OSCAL 500/200 D-3 PO) Take 1 tablet by mouth 2 (two) times daily.    [provider]  cetirizine (ZYRTEC) 10 MG tablet Take 1 tablet (10 mg total) by mouth daily. 03/07/21   Salena Saner, MD  chlorpheniramine (CHLOR-TRIMETON) 4 MG tablet Take 4 mg by mouth 2 (two) times daily as needed for allergies.    [provider]  diphenhydramine-acetaminophen (TYLENOL PM) 25-500 MG TABS tablet Take 1 tablet by mouth at bedtime as needed.    [provider]  famotidine (PEPCID) 40 MG tablet Take 1 tablet by mouth at bedtime. 01/23/23 01/23/24  [provider]  furosemide (LASIX) 20 MG tablet Take 20 mg by mouth daily as needed.    [provider]  hydrochlorothiazide (HYDRODIURIL) 25 MG tablet Take 1 tablet (25 mg) by mouth once daily 12/20/22   Creig Hines, NP  losartan (COZAAR) 100 MG tablet Take 1 tablet (100 mg) by mouth once daily    [provider]  montelukast (SINGULAIR) 10 MG tablet Take 10 mg by mouth at bedtime.    [provider]  Multiple Vitamin (MULTIVITAMIN) tablet Take 1 tablet by mouth daily.    [provider]  NON FORMULARY CPAP at bedtime.    [provider]  omeprazole (PRILOSEC) 40 MG capsule Take 1 capsule (40 mg total) by mouth daily. 07/01/23   Salena Saner, MD  Spacer/Aero-Holding Deretha Emory DEVI 1 each by Does not apply route daily. 11/24/18   Salena Saner, MD    Physical Exam    Vital Signs:  Wilmon Arms does not have vital signs available for  review today.  Given telephonic nature of communication, physical exam is limited. AAOx3. NAD. Normal affect.  Speech and respirations are unlabored.  Accessory Clinical Findings    None  Assessment & Plan    1.  Preoperative Cardiovascular Risk Assessment:  total left knee arthroplasty, Surgeon:  Francisco Capuchin Jr.,MD Surgeon's Group or Practice Name:  [facility] Fax number:  [phone]      Primary Cardiologist: Yvonne Kendall, MD  Chart reviewed as part of pre-operative protocol coverage. Given past medical history and time since last visit, based on ACC/AHA guidelines, KAMBRIE STOLTENBERG would be at acceptable risk for the planned procedure without further cardiovascular testing.   Her RCRI is low risk, 0.9% risk of major cardiac event.  She is able to complete greater than 4 METS of  physical activity.  Patient was advised that if she develops new symptoms prior to surgery to contact our office to arrange a follow-up appointment.  He verbalized understanding.  I will route this recommendation to the requesting party via Epic fax function and remove from pre-op pool.       Time:   Today, I have spent  5 minutes with the patient with telehealth technology discussing medical history, symptoms, and management plan.     Ronney Asters, NP  12/01/2023, 7:45 AM   Prior to patient's phone evaluation I spent greater than 10 minutes reviewing their past medical history and cardiac medications.

## 2023-12-04 ENCOUNTER — Encounter: Payer: Self-pay | Admitting: Pulmonary Disease

## 2023-12-04 NOTE — Telephone Encounter (Signed)
Dr. Gonzalez, please advise. Thanks 

## 2023-12-04 NOTE — Telephone Encounter (Signed)
She should go on ahead and proceed with the test.

## 2023-12-11 ENCOUNTER — Ambulatory Visit: Payer: Medicare Other | Attending: Pulmonary Disease

## 2023-12-11 DIAGNOSIS — Z87891 Personal history of nicotine dependence: Secondary | ICD-10-CM | POA: Insufficient documentation

## 2023-12-11 DIAGNOSIS — J4489 Other specified chronic obstructive pulmonary disease: Secondary | ICD-10-CM | POA: Insufficient documentation

## 2023-12-11 DIAGNOSIS — R0602 Shortness of breath: Secondary | ICD-10-CM

## 2023-12-11 LAB — PULMONARY FUNCTION TEST ARMC ONLY
DL/VA % pred: 89 %
DL/VA: 3.77 ml/min/mmHg/L
DLCO unc % pred: 93 %
DLCO unc: 16.93 ml/min/mmHg
FEF 25-75 Post: 0.95 L/s
FEF 25-75 Pre: 0.69 L/s
FEF2575-%Change-Post: 37 %
FEF2575-%Pred-Post: 54 %
FEF2575-%Pred-Pre: 39 %
FEV1-%Change-Post: 10 %
FEV1-%Pred-Post: 69 %
FEV1-%Pred-Pre: 62 %
FEV1-Post: 1.43 L
FEV1-Pre: 1.29 L
FEV1FVC-%Change-Post: 1 %
FEV1FVC-%Pred-Pre: 81 %
FEV6-%Change-Post: 9 %
FEV6-%Pred-Post: 88 %
FEV6-%Pred-Pre: 80 %
FEV6-Post: 2.29 L
FEV6-Pre: 2.09 L
FEV6FVC-%Pred-Post: 104 %
FEV6FVC-%Pred-Pre: 104 %
FVC-%Change-Post: 9 %
FVC-%Pred-Post: 84 %
FVC-%Pred-Pre: 77 %
FVC-Post: 2.29 L
FVC-Pre: 2.09 L
Post FEV1/FVC ratio: 62 %
Post FEV6/FVC ratio: 100 %
Pre FEV1/FVC ratio: 61 %
Pre FEV6/FVC Ratio: 100 %
RV % pred: 153 %
RV: 3.23 L
TLC % pred: 120 %
TLC: 5.74 L

## 2023-12-11 MED ORDER — ALBUTEROL SULFATE (2.5 MG/3ML) 0.083% IN NEBU
2.5000 mg | INHALATION_SOLUTION | Freq: Once | RESPIRATORY_TRACT | Status: AC
  Administered 2023-12-11: 2.5 mg via RESPIRATORY_TRACT
  Filled 2023-12-11: qty 3

## 2023-12-24 ENCOUNTER — Encounter: Payer: Self-pay | Admitting: Pulmonary Disease

## 2023-12-24 ENCOUNTER — Ambulatory Visit: Payer: Medicare Other | Admitting: Pulmonary Disease

## 2023-12-24 VITALS — BP 124/84 | HR 68 | Temp 96.8°F | Ht 63.0 in | Wt 219.0 lb

## 2023-12-24 DIAGNOSIS — E66812 Obesity, class 2: Secondary | ICD-10-CM | POA: Diagnosis not present

## 2023-12-24 DIAGNOSIS — G4733 Obstructive sleep apnea (adult) (pediatric): Secondary | ICD-10-CM | POA: Diagnosis not present

## 2023-12-24 DIAGNOSIS — Z6838 Body mass index (BMI) 38.0-38.9, adult: Secondary | ICD-10-CM | POA: Diagnosis not present

## 2023-12-24 DIAGNOSIS — E662 Morbid (severe) obesity with alveolar hypoventilation: Secondary | ICD-10-CM

## 2023-12-24 DIAGNOSIS — J4489 Other specified chronic obstructive pulmonary disease: Secondary | ICD-10-CM | POA: Diagnosis not present

## 2023-12-24 NOTE — Progress Notes (Signed)
 Subjective:    Patient ID: Rhonda Oconnell, female    DOB: 05/04/1951, 73 y.o.   MRN: [id number]  Patient Care Team: Nestor Banter, MD as PCP - General (Family Medicine) End, Veryl Gottron, MD as PCP - Cardiology (Cardiology) Marc Senior, MD as Consulting Physician (Pulmonary Disease)  Chief Complaint  Patient presents with   Follow-up    DOE. Little wheezing. Cough with white/yellow sputum. CPAP- Mask leaks. Pressure sometimes go too high.    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  This is a scheduled visit.  She was last seen on 25 September 2023.   HPI Discussed the use of AI scribe software for clinical note transcription with the patient, who gave verbal consent to proceed.  History of Present Illness   Rhonda Oconnell, a patient with a history of COPD with asthma overlap and obstructive sleep apnea, presents with concerns about an upcoming surgery (knee replacement on the left). She reports her breathing has been "pretty good" recently. However, she has been experiencing issues with her CPAP mask, including leakage and discomfort, particularly during episodes of coughing and congestion. Despite these issues, she has managed to maintain a high score on her CPAP machine, indicating good compliance with therapy.  Compliance monitoring today shows that she has excellent compliance with over 93% of days use over 4 hours.  Residual AHI on CPAP of 11 cm H2O is 1.4.  The download does show some episodes of significant air leakage.  She has also been dealing with a persistent cough and occasional laryngitis, which she attributes to a recent illness. Despite these symptoms, she reports no significant problems during her recent breathing test and believes her respiratory function has not worsened.  Overall she feels that she is at baseline with regards to her respiratory status.  She is compliant  with Breztri  2 puffs twice a day and as needed albuterol .   In addition to her respiratory issues, she has been taking vitamin B6 for an unspecified reason. She has also lost weight recently, which she believes has improved her breathing. She is scheduled for knee surgery in a week and has been working to lose weight in preparation for the procedure. She has successfully lost more than the required 15 pounds.      Review of Systems A 10 point review of systems was performed and it is as noted above otherwise negative.   Patient Active Problem List   Diagnosis Date Noted   Chronic cough 01/30/2022   Acute exacerbation of COPD with asthma (HCC) 01/16/2022   Acute rhinosinusitis 01/16/2022   Acute tracheobronchitis 01/16/2022   Dyspnea on exertion 04/29/2020   Chronic heart failure with preserved ejection fraction (HFpEF) (HCC) 04/29/2020   Laryngopharyngeal reflux (LPR) 01/04/2019   Acute recurrent sinusitis 01/04/2019   Morbid obesity (HCC) 01/02/2019   Morbid obesity with BMI of 40.0-44.9, adult (HCC) 11/24/2018   Obstructive lung disease (HCC) 09/24/2016   Varicose veins of both lower extremities with pain 09/24/2016   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 2 packs a day at most   Substance Use Topics   Alcohol use: No    Allergies  Allergen Reactions   Iodinated Contrast Media Hives   Iodine Hives   Sulfa Antibiotics Hives   Nsaids Cough   Amlodipine  Swelling   Meloxicam Palpitations    Current Meds   Medication Sig   acetaminophen  (TYLENOL ) 500 MG tablet Take 1,000 mg by mouth every 6 (six) hours as needed for moderate pain (pain score 4-6).   albuterol  (VENTOLIN  HFA) 108 (90 Base) MCG/ACT inhaler INHALE 2 PUFFS INTO THE LUNGS EVERY 6 HOURS AS NEEDED FOR WHEEZING OR SHORTNESS OF BREATH   atorvastatin  (LIPITOR) 20 MG tablet Take 20 mg by mouth at bedtime.   azelastine  (ASTELIN ) 0.1 % nasal spray Place 2 sprays into both nostrils 2 (two) times daily. Use in each nostril as directed (Patient taking differently: Place 2 sprays into both nostrils 2 (two) times daily as needed for allergies. Use in each nostril as directed)   Budeson-Glycopyrrol-Formoterol  (BREZTRI  AEROSPHERE) 160-9-4.8 MCG/ACT AERO Inhale 2 puffs into the lungs in the morning and at bedtime.   cetirizine  (ZYRTEC ) 10 MG tablet Take 1 tablet (10 mg total) by mouth daily.   chlorpheniramine (CHLOR-TRIMETON) 4 MG tablet Take 4 mg by mouth at bedtime.   Collagen-Vitamin C (COLLAGEN PLUS VITAMIN C PO) Take 1 tablet by mouth daily.   diphenhydramine -acetaminophen  (TYLENOL  PM) 25-500 MG TABS tablet Take 2 tablets by mouth at bedtime.   famotidine (PEPCID) 40 MG tablet Take 1 tablet by mouth at bedtime.   hydrochlorothiazide  (HYDRODIURIL ) 25 MG tablet Take 1 tablet (25 mg) by mouth once daily   ibuprofen (ADVIL) 200 MG tablet Take 400 mg by mouth daily as needed for moderate pain (pain score 4-6).   losartan  (COZAAR ) 100 MG tablet Take 100 mg by mouth at bedtime.   montelukast  (SINGULAIR ) 10 MG tablet Take 10 mg by mouth at bedtime.   Multiple Minerals-Vitamins (CAL MAG ZINC +D3 PO) Take 1 tablet by mouth daily.   Multiple Vitamin (MULTIVITAMIN) tablet Take 1 tablet by mouth daily.   NON FORMULARY CPAP at bedtime.   omeprazole  (PRILOSEC) 40 MG capsule Take 1 capsule (40 mg total) by mouth daily.   pyridOXINE (VITAMIN B6) 100 MG tablet Take 100 mg by mouth daily.   Spacer/Aero-Holding Chambers DEVI 1 each by Does not apply route daily.     Immunization History  Administered Date(s) Administered   Fluad Quad(high Dose 65+) 09/15/2019, 03/07/2022, 10/22/2022   Influenza Inj Mdck Quad Pf 10/01/2018   Influenza Split 10/31/2015   Influenza-Unspecified 10/20/2017, 10/02/2020   Pneumococcal Conjugate-13 08/29/2016   Pneumococcal Polysaccharide-23 03/22/2011, 10/20/2017   Zoster Recombinant(Shingrix) 01/08/2019, 05/05/2019        Objective:     BP 124/84 (BP Location: Right Wrist, Cuff Size: Normal)   Pulse 68   Temp (!) 96.8 F (36 C)   Ht 5\' 3"  (1.6 m)   Wt 219 lb (99.3 kg)   SpO2 97%   BMI 38.79 kg/m   SpO2: 97 % O2 Device: None (Room air)  GENERAL: Obese woman, no acute respiratory distress, fully ambulatory.  No conversational dyspnea.  HEAD: Normocephalic, atraumatic. EYES: Pupils equal, round, reactive to light.  No scleral icterus. MOUTH: Wears dentures.  Oral mucosa moist.  No thrush. NECK: Supple. No thyromegaly. Trachea midline.  No JVD.  No adenopathy. PULMONARY: Good air entry bilaterally, symmetrical breath sounds.  No adventitious sounds. CARDIOVASCULAR: S1 and S2. Regular rate and rhythm.  Grade 2/6 systolic ejection murmur left sternal border. No change from prior.   GASTROINTESTINAL: Obese, benign. MUSCULOSKELETAL: No joint deformity, no clubbing, no edema. NEUROLOGIC: Fully ambulatory with no gait disturbance noted.  Awake, alert, speech fluent, no overt focal deficits. SKIN: Intact,warm,dry.  On limited exam no rashes. PSYCH: Normal mood and behavior.   PFTs were performed at 11 December 2023: FEV1 1.29 L or 62% predicted, FVC 2.09 L or 77% predicted, FEV1/FVC 61%, there is no significant bronchodilator response.  ERV decreased at 16% consistent with obesity.  Her lung volumes show mild hyperinflation and moderate air trapping.  Diffusion capacity is normal.  Consistent with moderate airway obstruction with overinflation.  No significant change from prior PFT performed 06 June 2022.  Assessment & Plan:     ICD-10-CM   1. Asthma-COPD overlap syndrome (HCC)  J44.89     2. OSA on CPAP  G47.33 Ambulatory Referral for DME    3. Class 2 obesity with alveolar hypoventilation without serious comorbidity with body mass index (BMI) of 38.0 to 38.9 in adult [facility])  Z61.096    E66.2    Z68.38       Orders Placed This Encounter  Procedures   Ambulatory Referral for DME    Referral Priority:   Routine    Referral Type:   Durable Medical Equipment Purchase    Number of Visits Requested:   1   Discussion:    COPD with Asthma Overlap Moderate COPD with asthmatic component. Pulmonary function tests unchanged. Reports stable breathing with occasional cough and laryngitis. Weight loss has positively impacted respiratory function. Emphasized continued weight management. - Continue Breztri  2 puffs twice a day - Use albuterol  as needed - Provide CPAP post-procedure  Obstructive Sleep Apnea Obstructive sleep apnea managed with CPAP. Issues with mask fit and occasional leaks, likely due to recent weight loss. Discussed need for proper mask fit to ensure effective therapy. - Refer to [employer] for CPAP mask refitting - Ensure CPAP use post-procedure  Preoperative Risk Assessment Scheduled for left knee surgery in one week. Mild to moderate risk for pulmonary complications. Preoperative pulmonary function tests stable. Emphasized CPAP use post-procedure to mitigate risks. - Provide preoperative risk assessment - Document mild to moderate risk for pulmonary complications - Ensure CPAP use post-procedure - Incentive spirometry and mobilization as soon as feasible from surgical standpoint - Continue respiratory medications Breztri  2 puffs twice a day, as needed albuterol  for breakthrough symptoms.  General Health Maintenance Taking vitamin B6 without clear indication. Risk of neuropathy with long-term use. Discussed discontinuation to prevent potential neuropathy and  fatigue. - Discontinue vitamin B6  Follow-up - Schedule follow-up appointment in 3 months.      Advised if symptoms do not improve or worsen, to please contact office for sooner follow up or seek emergency care.    I spent 42 minutes of dedicated to the care of this patient on the date of this encounter to include pre-visit review of records, face-to-face time with the patient discussing conditions above, post visit ordering of testing, clinical documentation with the electronic health record, making appropriate referrals as documented, and communicating necessary findings to members of the patients care team.    C. Chloe Counter, MD Advanced Bronchoscopy PCCM [facility]    *This note was generated using voice recognition software/Dragon and/or AI transcription program.  Despite best efforts to proofread, errors can occur which can change the meaning. Any transcriptional errors that result from this process are unintentional and may not be fully corrected at the time of dictation.

## 2023-12-24 NOTE — Patient Instructions (Addendum)
 VISIT SUMMARY:  During today's visit, we discussed your upcoming knee surgery and reviewed your respiratory health. You reported stable breathing with occasional cough and laryngitis, and we noted that your recent weight loss has positively impacted your respiratory function. We also addressed issues with your CPAP mask and your use of vitamin B6.  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 Your pulmonary function tests remain unchanged, and your breathing is stable. Continue using Breztri , 2 puffs twice a day, and use albuterol  as needed. Your weight loss has been beneficial, so keep up the good work with weight management.  -OBSTRUCTIVE SLEEP APNEA: Obstructive sleep apnea is a condition where your breathing stops and starts during sleep. You are managing this with CPAP therapy, but recent weight loss has caused issues with your mask fit. We will refer you to [facility] for a CPAP mask refitting to ensure effective therapy. Continue using your CPAP, especially after your surgery.  -PREOPERATIVE ASSESSMENT: You are scheduled for knee surgery in a week, and your pulmonary function tests are stable. There is a mild to moderate risk for pulmonary complications, so it is important to use your CPAP after the procedure. We have provided your preoperative assessment as mild to moderate for pulmonary complications after the procedure.  -GENERAL HEALTH MAINTENANCE: You have been taking vitamin B6 without a clear reason, which can cause nerve damage if used long-term. We recommend discontinuing vitamin B6 to prevent potential issues.  INSTRUCTIONS:  Please schedule a follow-up appointment in 3 months.

## 2023-12-25 ENCOUNTER — Encounter
Admission: RE | Admit: 2023-12-25 | Discharge: 2023-12-25 | Disposition: A | Discharge status: Home / Self Care | Payer: Medicare Other | Source: Ambulatory Visit | Attending: Orthopedic Surgery | Admitting: Orthopedic Surgery

## 2023-12-25 VITALS — BP 159/61 | HR 76 | Resp 12 | Ht 63.0 in | Wt 219.0 lb

## 2023-12-25 DIAGNOSIS — R8290 Unspecified abnormal findings in urine: Secondary | ICD-10-CM | POA: Diagnosis not present

## 2023-12-25 DIAGNOSIS — Z862 Personal history of diseases of the blood and blood-forming organs and certain disorders involving the immune mechanism: Secondary | ICD-10-CM | POA: Insufficient documentation

## 2023-12-25 DIAGNOSIS — Z01818 Encounter for other preprocedural examination: Secondary | ICD-10-CM | POA: Insufficient documentation

## 2023-12-25 DIAGNOSIS — M1712 Unilateral primary osteoarthritis, left knee: Secondary | ICD-10-CM | POA: Diagnosis not present

## 2023-12-25 DIAGNOSIS — Z01810 Encounter for preprocedural cardiovascular examination: Secondary | ICD-10-CM | POA: Diagnosis present

## 2023-12-25 DIAGNOSIS — R8281 Pyuria: Secondary | ICD-10-CM | POA: Insufficient documentation

## 2023-12-25 DIAGNOSIS — Z01812 Encounter for preprocedural laboratory examination: Secondary | ICD-10-CM | POA: Diagnosis present

## 2023-12-25 HISTORY — DX: Obstructive sleep apnea (adult) (pediatric): G47.33

## 2023-12-25 HISTORY — DX: Family history of other specified conditions: Z84.89

## 2023-12-25 HISTORY — DX: Asymptomatic varicose veins of unspecified lower extremity: I83.90

## 2023-12-25 HISTORY — DX: Morbid (severe) obesity due to excess calories: E66.01

## 2023-12-25 HISTORY — DX: Other specified postprocedural states: Z98.890

## 2023-12-25 HISTORY — DX: Other complications of anesthesia, initial encounter: T88.59XA

## 2023-12-25 HISTORY — DX: Prediabetes: R73.03

## 2023-12-25 HISTORY — DX: Personal history of nicotine dependence: Z87.891

## 2023-12-25 HISTORY — DX: Cardiac murmur, unspecified: R01.1

## 2023-12-25 HISTORY — DX: Dyspnea, unspecified: R06.00

## 2023-12-25 HISTORY — DX: Gastro-esophageal reflux disease without esophagitis: K21.9

## 2023-12-25 LAB — COMPREHENSIVE METABOLIC PANEL
ALT: 25 U/L (ref 0–44)
AST: 30 U/L (ref 15–41)
Albumin: 4.6 g/dL (ref 3.5–5.0)
Alkaline Phosphatase: 71 U/L (ref 38–126)
Anion gap: 13 (ref 5–15)
BUN: 12 mg/dL (ref 8–23)
CO2: 27 mmol/L (ref 22–32)
Calcium: 10.1 mg/dL (ref 8.9–10.3)
Chloride: 100 mmol/L (ref 98–111)
Creatinine, Ser: 0.82 mg/dL (ref 0.44–1.00)
GFR, Estimated: >60 mL/min (ref >60)
Glucose, Bld: 94 mg/dL (ref 70–99)
Potassium: 3.3 mmol/L — ABNORMAL LOW (ref 3.5–5.1)
Sodium: 140 mmol/L (ref 135–145)
Total Bilirubin: 0.6 mg/dL (ref 0.0–1.2)
Total Protein: 8 g/dL (ref 6.5–8.1)

## 2023-12-25 LAB — CBC
HCT: 40.7 % (ref 36.0–46.0)
Hemoglobin: 13.6 g/dL (ref 12.0–15.0)
MCH: 31.1 pg (ref 26.0–34.0)
MCHC: 33.4 g/dL (ref 30.0–36.0)
MCV: 92.9 fL (ref 80.0–100.0)
Platelets: 340 10*3/uL (ref 150–400)
RBC: 4.38 MIL/uL (ref 3.87–5.11)
RDW: 12.9 % (ref 11.5–15.5)
WBC: 10.1 10*3/uL (ref 4.0–10.5)
nRBC: 0 % (ref 0.0–0.2)

## 2023-12-25 LAB — C-REACTIVE PROTEIN: CRP: <0.5 mg/dL (ref <1.0)

## 2023-12-25 LAB — URINALYSIS, ROUTINE W REFLEX MICROSCOPIC
Bacteria, UA: NONE SEEN
Bilirubin Urine: NEGATIVE
Glucose, UA: NEGATIVE mg/dL
Hgb urine dipstick: NEGATIVE
Ketones, ur: NEGATIVE mg/dL
Nitrite: NEGATIVE
Protein, ur: NEGATIVE mg/dL
Specific Gravity, Urine: 1.011 (ref 1.005–1.030)
pH: 7 (ref 5.0–8.0)

## 2023-12-25 LAB — SEDIMENTATION RATE: Sed Rate: 40 mm/h — ABNORMAL HIGH (ref 0–30)

## 2023-12-25 LAB — SURGICAL PCR SCREEN
MRSA, PCR: NEGATIVE
Staphylococcus aureus: NEGATIVE

## 2023-12-25 NOTE — Patient Instructions (Addendum)
Your procedure is scheduled on:12-31-23 Wednesday Report to the Registration Desk on the 1st floor of the [facility].Then proceed to the 2nd floor Surgery Desk To find out your arrival time, please call [phone] between 1PM - 3PM on:12-30-23 Tuesday If your arrival time is 6:00 am, do not arrive before that time as the [facility] entrance doors do not open until 6:00 am.  REMEMBER: Instructions that are not followed completely may result in serious medical risk, up to and including death; or upon the discretion of your surgeon and anesthesiologist your surgery may need to be rescheduled.  Do not eat food after midnight the night before surgery.  No gum chewing or hard candies.  You may however, drink CLEAR liquids up to 2 hours before you are scheduled to arrive for your surgery. Do not drink anything within 2 hours of your scheduled arrival time.  Clear liquids include: - water  - apple juice without pulp - gatorade (not RED colors) - black coffee or tea (Do NOT add milk or creamers to the coffee or tea) Do NOT drink anything that is not on this list.  In addition, your doctor has ordered for you to drink the provided:  Ensure Pre-Surgery Clear Carbohydrate Drink  Drinking this carbohydrate drink up to two hours before surgery helps to reduce insulin resistance and improve patient outcomes. Please complete drinking 2 hours before scheduled arrival time.  One week prior to surgery:Stop NOW (12-25-23) Stop Anti-inflammatories (NSAIDS) such as Advil, Aleve, Ibuprofen, Motrin, Naproxen, Naprosyn and Aspirin based products such as Excedrin, Goody's Powder, BC Powder. Stop ANY OVER THE COUNTER supplements until after surgery (Collagen-Vitamin C, Calcium-Magnesium-Zinc-D3, Multivitamin, Vitamin B6)  You may however, continue to take Tylenol if needed for pain up until the day of surgery.  Continue taking all of your other prescription medications up until the day of surgery.  ON THE  DAY OF SURGERY ONLY TAKE THESE MEDICATIONS WITH SIPS OF WATER: -cetirizine (ZYRTEC)  -omeprazole (PRILOSEC)   Use your Breztri and Albuterol Inhaler the day of surgery and bring your Albuterol Inhaler to the hospital  No Alcohol for 24 hours before or after surgery.  No Smoking including e-cigarettes for 24 hours before surgery.  No chewable tobacco products for at least 6 hours before surgery.  No nicotine patches on the day of surgery.  Do not use any "recreational" drugs for at least a week (preferably 2 weeks) before your surgery.  Please be advised that the combination of cocaine and anesthesia may have negative outcomes, up to and including death. If you test positive for cocaine, your surgery will be cancelled.  On the morning of surgery brush your teeth with toothpaste and water, you may rinse your mouth with mouthwash if you wish. Do not swallow any toothpaste or mouthwash.  Use CHG Soap as directed on instruction sheet.  Do not wear jewelry, make-up, hairpins, clips or nail polish.  For welded (permanent) jewelry: bracelets, anklets, waist bands, etc.  Please have this removed prior to surgery.  If it is not removed, there is a chance that hospital personnel will need to cut it off on the day of surgery.  Do not wear lotions, powders, or perfumes.   Do not shave body hair from the neck down 48 hours before surgery.  Contact lenses, hearing aids and dentures may not be worn into surgery.  Do not bring valuables to the hospital. [facility] is not responsible for any missing/lost belongings or valuables.  Bring your C-PAP to the hospital  Notify your doctor if there is any change in your medical condition (cold, fever, infection).  Wear comfortable clothing (specific to your surgery type) to the hospital.  After surgery, you can help prevent lung complications by doing breathing exercises.  Take deep breaths and cough every 1-2 hours. Your doctor may order a device  called an Incentive Spirometer to help you take deep breaths. When coughing or sneezing, hold a pillow firmly against your incision with both hands. This is called "splinting." Doing this helps protect your incision. It also decreases belly discomfort.  If you are being admitted to the hospital overnight, leave your suitcase in the car. After surgery it may be brought to your room.  In case of increased patient census, it may be necessary for you, the patient, to continue your postoperative care in the Same Day Surgery department.  If you are being discharged the day of surgery, you will not be allowed to drive home. You will need a responsible individual to drive you home and stay with you for 24 hours after surgery.   If you are taking public transportation, you will need to have a responsible individual with you.  Please call the Pre-admissions Testing Dept. at [phone] if you have any questions about these instructions.  Surgery Visitation Policy:  Patients having surgery or a procedure may have two visitors.  Children under the age of 41 must have an adult with them who is not the patient.  Temporary Visitor Restrictions Due to increasing cases of flu, RSV and COVID-19: Children ages 66 and under will not be able to visit patients in [facility] hospitals under most circumstances.  Inpatient Visitation:    Visiting hours are 7 a.m. to 8 p.m. Up to four visitors are allowed at one time in a patient room. The visitors may rotate out with other people during the day.  One visitor age 68 or older may stay with the patient overnight and must be in the room by 8 p.m.    Pre-operative 5 CHG Bath Instructions   You can play a key role in reducing the risk of infection after surgery. Your skin needs to be as free of germs as possible. You can reduce the number of germs on your skin by washing with CHG (chlorhexidine gluconate) soap before surgery. CHG is an antiseptic soap that  kills germs and continues to kill germs even after washing.   DO NOT use if you have an allergy to chlorhexidine/CHG or antibacterial soaps. If your skin becomes reddened or irritated, stop using the CHG and notify one of our RNs at [phone].   Please shower with the CHG soap starting 4 days before surgery using the following schedule:     Please keep in mind the following:  DO NOT shave, including legs and underarms, starting the day of your first shower.   You may shave your face at any point before/day of surgery.  Place clean sheets on your bed the day you start using CHG soap. Use a clean washcloth (not used since being washed) for each shower. DO NOT sleep with pets once you start using the CHG.   CHG Shower Instructions:  If you choose to wash your hair and private area, wash first with your normal shampoo/soap.  After you use shampoo/soap, rinse your hair and body thoroughly to remove shampoo/soap residue.  Turn the water OFF and apply about 3 tablespoons (45 ml) of CHG soap  to a [employer].  Apply CHG soap ONLY FROM YOUR NECK DOWN TO YOUR TOES (washing for 3-5 minutes)  DO NOT use CHG soap on face, private areas, open wounds, or sores.  Pay special attention to the area where your surgery is being performed.  If you are having back surgery, having someone wash your back for you may be helpful. Wait 2 minutes after CHG soap is applied, then you may rinse off the CHG soap.  Pat dry with a clean towel  Put on clean clothes/pajamas   If you choose to wear lotion, please use ONLY the CHG-compatible lotions on the back of this paper.     Additional instructions for the day of surgery: DO NOT APPLY any lotions, deodorants, cologne, or perfumes.   Put on clean/comfortable clothes.  Brush your teeth.  Ask your nurse before applying any prescription medications to the skin.      CHG Compatible Lotions   Aveeno Moisturizing lotion  Cetaphil Moisturizing Cream   Cetaphil Moisturizing Lotion  Clairol Herbal Essence Moisturizing Lotion, Dry Skin  Clairol Herbal Essence Moisturizing Lotion, Extra Dry Skin  Clairol Herbal Essence Moisturizing Lotion, Normal Skin  Curel Age Defying Therapeutic Moisturizing Lotion with Alpha Hydroxy  Curel Extreme Care Body Lotion  Curel Soothing Hands Moisturizing Hand Lotion  Curel Therapeutic Moisturizing Cream, Fragrance-Free  Curel Therapeutic Moisturizing Lotion, Fragrance-Free  Curel Therapeutic Moisturizing Lotion, Original Formula  Eucerin Daily Replenishing Lotion  Eucerin Dry Skin Therapy Plus Alpha Hydroxy Crme  Eucerin Dry Skin Therapy Plus Alpha Hydroxy Lotion  Eucerin Original Crme  Eucerin Original Lotion  Eucerin Plus Crme Eucerin Plus Lotion  Eucerin TriLipid Replenishing Lotion  Keri Anti-Bacterial Hand Lotion  Keri Deep Conditioning Original Lotion Dry Skin Formula Softly Scented  Keri Deep Conditioning Original Lotion, Fragrance Free Sensitive Skin Formula  Keri Lotion Fast Absorbing Fragrance Free Sensitive Skin Formula  Keri Lotion Fast Absorbing Softly Scented Dry Skin Formula  Keri Original Lotion  Keri Skin Renewal Lotion Keri Silky Smooth Lotion  Keri Silky Smooth Sensitive Skin Lotion  Nivea Body Creamy Conditioning Oil  Nivea Body Extra Enriched Lotion  Nivea Body Original Lotion  Nivea Body Sheer Moisturizing Lotion Nivea Crme  Nivea Skin Firming Lotion  NutraDerm 30 Skin Lotion  NutraDerm Skin Lotion  NutraDerm Therapeutic Skin Cream  NutraDerm Therapeutic Skin Lotion  ProShield Protective Hand Cream  Provon moisturizing lotion  How to Use an Incentive Spirometer An incentive spirometer is a tool that measures how well you are filling your lungs with each breath. Learning to take long, deep breaths using this tool can help you keep your lungs clear and active. This may help to reverse or lessen your chance of developing breathing (pulmonary) problems, especially infection.  You may be asked to use a spirometer: After a surgery. If you have a lung problem or a history of smoking. After a long period of time when you have been unable to move or be active. If the spirometer includes an indicator to show the highest number that you have reached, your health care provider or respiratory therapist will help you set a goal. Keep a log of your progress as told by your health care provider. What are the risks? Breathing too quickly may cause dizziness or cause you to pass out. Take your time so you do not get dizzy or light-headed. If you are in pain, you may need to take pain medicine before doing incentive spirometry. It is harder to take a deep  breath if you are having pain. How to use your incentive spirometer  Sit up on the edge of your bed or on a chair. Hold the incentive spirometer so that it is in an upright position. Before you use the spirometer, breathe out normally. Place the mouthpiece in your mouth. Make sure your lips are closed tightly around it. Breathe in slowly and as deeply as you can through your mouth, causing the piston or the ball to rise toward the top of the chamber. Hold your breath for 3-5 seconds, or for as long as possible. If the spirometer includes a coach indicator, use this to guide you in breathing. Slow down your breathing if the indicator goes above the marked areas. Remove the mouthpiece from your mouth and breathe out normally. The piston or ball will return to the bottom of the chamber. Rest for a few seconds, then repeat the steps 10 or more times. Take your time and take a few normal breaths between deep breaths so that you do not get dizzy or light-headed. Do this every 1-2 hours when you are awake. If the spirometer includes a goal marker to show the highest number you have reached (best effort), use this as a goal to work toward during each repetition. After each set of 10 deep breaths, cough a few times. This will help to make  sure that your lungs are clear. If you have an incision on your chest or abdomen from surgery, place a pillow or a rolled-up towel firmly against the incision when you cough. This can help to reduce pain while taking deep breaths and coughing. General tips When you are able to get out of bed: Walk around often. Continue to take deep breaths and cough in order to clear your lungs. Keep using the incentive spirometer until your health care provider says it is okay to stop using it. If you have been in the hospital, you may be told to keep using the spirometer at home. Contact a health care provider if: You are having difficulty using the spirometer. You have trouble using the spirometer as often as instructed. Your pain medicine is not giving enough relief for you to use the spirometer as told. You have a fever. Get help right away if: You develop shortness of breath. You develop a cough with bloody mucus from the lungs. You have fluid or blood coming from an incision site after you cough. Summary An incentive spirometer is a tool that can help you learn to take long, deep breaths to keep your lungs clear and active. You may be asked to use a spirometer after a surgery, if you have a lung problem or a history of smoking, or if you have been inactive for a long period of time. Use your incentive spirometer as instructed every 1-2 hours while you are awake. If you have an incision on your chest or abdomen, place a pillow or a rolled-up towel firmly against your incision when you cough. This will help to reduce pain. Get help right away if you have shortness of breath, you cough up bloody mucus, or blood comes from your incision when you cough. This information is not intended to replace advice given to you by your health care provider. Make sure you discuss any questions you have with your health care provider. Document Revised: 02/14/2020 Document Reviewed: 02/14/2020 Elsevier Patient Education   2024 [employer].

## 2023-12-25 NOTE — Discharge Instructions (Addendum)
 Instructions after Total Knee Replacement   Rhonda Oconnell Rhonda Oconnell., M.D.    Dept. of Orthopaedics & Sports Medicine [facility] [address] [place], [place]  [address]  Phone: [phone]   Fax: [phone]       [email or web address]       DIET: Drink plenty of non-alcoholic fluids. Resume your normal diet. Include foods high in fiber.  ACTIVITY:  You may use crutches or a walker with weight-bearing as tolerated, unless instructed otherwise. You may be weaned off of the walker or crutches by your Physical Therapist.  Do NOT place pillows under the knee. Anything placed under the knee could limit your ability to straighten the knee.   Continue doing gentle exercises. Exercising will reduce the pain and swelling, increase motion, and prevent muscle weakness.   Please continue to use the TED compression stockings for 6 weeks. You may remove the stockings at night, but should reapply them in the morning. Do not drive or operate any equipment until instructed.  WOUND CARE:  Continue to use the PolarCare or ice packs periodically to reduce pain and swelling. You may bathe or shower after the staples are removed at the first office visit following surgery.  MEDICATIONS: You may resume your regular medications. Please take the pain medication as prescribed on the medication. Do not take pain medication on an empty stomach. You have been given a prescription for a blood thinner (Lovenox or Coumadin). Please take the medication as instructed. (NOTE: After completing a 2 week course of Lovenox, take one Enteric-coated aspirin once a day. This along with elevation will help reduce the possibility of phlebitis in your operated leg.) Do not drive or drink alcoholic beverages when taking pain medications.  CALL THE OFFICE FOR: Temperature above 101 degrees Excessive bleeding or drainage on the dressing. Excessive swelling, coldness, or paleness of the toes. Persistent nausea and  vomiting.  FOLLOW-UP:  You should have an appointment to return to the office in 10-14 days after surgery. Arrangements have been made for continuation of Physical Therapy (either home therapy or outpatient therapy).     [facility] Department Directory         [email or web address]       [email or web address]          Cardiology  Appointments: [place] - [phone]  Endocrinology  Appointments: [place] [phone] [place] - [phone]  Gastroenterology  Appointments: [place] [phone] [place] - [phone]        General Surgery   Appointments: [facility]  Internal Medicine/Family Medicine  Appointments: [facility] [place] - [phone] [place] - [phone]  Metabolic and Weigh Loss Surgery  Appointments: [facility]        Neurology  Appointments: [place] [phone] [place] - [phone]  Neurosurgery  Appointments: [place]  Obstetrics & Gynecology  Appointments: [place] [phone] [place] - [phone]        Pediatrics  Appointments: [place] [phone] [place] - [phone]  Physiatry  Appointments: [place] [phone]  Physical Therapy  Appointments: [place] - [phone]        Podiatry  Appointments: [place] [phone] [place] - [phone]  Pulmonology  Appointments: [place]  Rheumatology  Appointments: [place] [phone]        [place] Location: [facility]  [address] [place], [place]  [address]  [place] Location: [facility] [address] [place], [place]  [address]  [place] Location: [facility] [address]  can come to see you

## 2023-12-26 LAB — URINE CULTURE: Culture: <10000 — AB

## 2023-12-29 ENCOUNTER — Encounter: Payer: Self-pay | Admitting: Orthopedic Surgery

## 2023-12-29 NOTE — Progress Notes (Signed)
Perioperative / Anesthesia Services  Pre-Admission Testing Clinical Review / Pre-Operative Anesthesia Consult  Date: 12/30/23  Patient Demographics:  Name: Rhonda Oconnell DOB: [birth year] MRN:   [id number]  Planned Surgical Procedure(s):    Case: [id number] Date/Time: 12/31/23 1122   Procedure: COMPUTER ASSISTED TOTAL KNEE ARTHROPLASTY (Left: Knee)   Anesthesia type: Choice   Pre-op diagnosis: PRIMARY OSTEOARTHRITIS OF LEFT KNEE.   Location: [facility] OR ROOM 01 / [facility]S FOR ANESTHESIA [facility]   Surgeons: Donato Heinz, MD      NOTE: Available PAT nursing documentation and vital signs have been reviewed. Clinical nursing staff has updated patient's PMH/PSHx, current medication list, and drug allergies/intolerances to ensure comprehensive history available to assist in medical decision making as it pertains to the aforementioned surgical procedure and anticipated anesthetic course. Extensive review of available clinical information personally performed. Rhonda Oconnell PMH and PSHx updated with any diagnoses/procedures that  may have been inadvertently omitted during his intake with the pre-admission testing department's nursing staff.  Clinical Discussion:  Rhonda Oconnell is a 73 y.o. female who is submitted for pre-surgical anesthesia review and clearance prior to her undergoing the above procedure. Patient is a Former Smoker (50 pack years; quit 08/1993). Pertinent PMH includes: HFpEF, chest pain, aortic atherosclerosis, HTN, HLD, prediabetes, asthma-COPD overlap syndrome, OSAH (requires nocturnal PAP therapy), GERD (on daily PPI), dysphagia, anemia, ITP, OA.  Patient is followed by cardiology (End, MD). She was last seen in the cardiology clinic on 01/24/2023; notes reviewed. At the time of her clinic visit, patient doing well overall from a cardiovascular perspective.  Patient with chronic exertional dyspnea that was reported to be stable and at baseline.  Patient denied any chest pain, PND,  orthopnea, palpitations, significant peripheral edema, weakness, fatigue, vertiginous symptoms, or presyncope/syncope. Patient with a past medical history significant for cardiovascular diagnoses. Documented physical exam was grossly benign, providing no evidence of acute exacerbation and/or decompensation of the patient's known cardiovascular conditions.  Myocardial perfusion imaging study was performed on 05/15/2020 revealing a normal left ventricular systolic function with an EF of 55-65%.  There was no evidence of stress-induced myocardial ischemia or arrhythmia; no scintigraphic evidence of scar.  Study determined to be normal and low risk.  Most recent TTE was performed on 03/01/2022 revealed a normal left ventricular systolic function with an EF of 60 to 65%.  There were no regional wall motion abnormalities.  Mild LVH was observed. Left ventricular diastolic Doppler parameters consistent with abnormal relaxation (G1DD).  Right ventricular size and function normal.  Aortic valve noted to be sclerotic. All transvalvular gradients were noted to be normal providing no evidence suggestive of valvular stenosis. Aorta normal in size with no evidence of ectasia or aneurysmal dilatation.  Blood pressure well controlled at 122/78 mmHg on currently prescribed diuretic (HCTZ) and ARB (losartan) therapies.  Patient is on atorvastatin for her HLD diagnosis and ASCVD prevention.  Patient has a prediabetes diagnosis.  Most recent hemoglobin A1c was 5.8% when checked on 07/21/2023.  She does have an OSAH diagnosis and is reported to be compliant with prescribed nocturnal PAP therapy. Patient is able to complete all of her  ADL/IADLs without cardiovascular limitation.  Per the [facility]AS[facility], patient is able to achieve at least 4 METS of physical activity without experiencing any significant degree of angina/anginal equivalent symptoms. No changes were made to her medication regimen during her visit with cardiology.  Patient  scheduled to follow-up with outpatient cardiology in 6 months or sooner if needed.  Rhonda Oconnell is scheduled for an elective COMPUTER ASSISTED TOTAL KNEE ARTHROPLASTY (Left: Knee) on 12/31/2023 with Dr. Francesco Sor, MD. Given patient's past medical history significant for cardiovascular diagnoses, presurgical cardiac clearance was sought by the PAT team. Per cardiology, "RCRI is low risk, 0.9% risk of major cardiac event.  She is able to complete greater than 4 METS of physical activity.  Given past medical history and time since last visit, based on ACC/AHA guidelines, Rhonda Oconnell would be at ACCEPTABLE risk for the planned procedure without further cardiovascular testing".  In review of her medication reconciliation, the patient is not noted to be taking any type of anticoagulation or antiplatelet therapies that would need to be held during her perioperative course.  Patient reports previous perioperative complications with anesthesia in the past. Patient has a PMH (+) for PONV both personally and in both first and second degree relatives. Symptoms and history of PONV will be discussed with patient by anesthesia team on the day of her procedure. Interventions will be ordered as deemed necessary based on patient's individual care needs as determined by anesthesiologist.  In review her EMR, it is noted that patient underwent a general anesthetic course here at [facility] ([facility]) in 07/2016 without documented complications.      12/25/2023   10:35 AM 12/24/2023    9:29 AM 09/25/2023   10:31 AM  Vitals with BMI  Height 5\' 3"  5\' 3"  5\' 3"   Weight 219 lbs 219 lbs 232 lbs 10 oz  BMI 38.8 38.8 41.21  Systolic 159 124 884  Diastolic 61 84 86  Pulse 76 68 107   Providers/Specialists:  NOTE: Primary physician provider listed below. Patient may have been seen by APP or partner within same practice.   PROVIDER ROLE / SPECIALTY LAST OV  Hooten, Illene Labrador, MD  Orthopedics (Surgeon) 12/25/2023  Kandyce Rud, MD Primary Care Provider 12/01/2023  Yvonne Kendall, MD Cardiology 01/24/2023; update call with preop APP on 12/01/2023  Sarina Ser, MD Pulmonary Medicine 12/24/2023   Allergies:   Allergies  Allergen Reactions   Iodinated Contrast Media Hives   Iodine Hives   Sulfa Antibiotics Hives   Nsaids Cough   Amlodipine Swelling   Meloxicam Palpitations   Current Home Medications:   No current facility-administered medications for this encounter.    acetaminophen (TYLENOL) 500 MG tablet   albuterol (VENTOLIN HFA) 108 (90 Base) MCG/ACT inhaler   atorvastatin (LIPITOR) 20 MG tablet   azelastine (ASTELIN) 0.1 % nasal spray   Budeson-Glycopyrrol-Formoterol (BREZTRI AEROSPHERE) 160-9-4.8 MCG/ACT AERO   cetirizine (ZYRTEC) 10 MG tablet   chlorpheniramine (CHLOR-TRIMETON) 4 MG tablet   Collagen-Vitamin C (COLLAGEN PLUS VITAMIN C PO)   diphenhydramine-acetaminophen (TYLENOL PM) 25-500 MG TABS tablet   famotidine (PEPCID) 40 MG tablet   hydrochlorothiazide (HYDRODIURIL) 25 MG tablet   ibuprofen (ADVIL) 200 MG tablet   losartan (COZAAR) 100 MG tablet   montelukast (SINGULAIR) 10 MG tablet   Multiple Minerals-Vitamins (CAL MAG ZINC +D3 PO)   Multiple Vitamin (MULTIVITAMIN) tablet   omeprazole (PRILOSEC) 40 MG capsule   NON FORMULARY   Spacer/Aero-Holding Chambers DEVI   History:   Past Medical History:  Diagnosis Date   Abdominal hernia    Adenoma of right adrenal gland    Allergic state    Anemia    Aortic atherosclerosis (HCC)    Arthritis    Asthma-COPD overlap syndrome (HCC)    Chest pain    a. 04/2013 MV:  Nl EF, no isch/infarct ; b. 05/2020 MV: EF 55-65%, no isch/infarct.   Chronic heart failure with preserved ejection fraction (HFpEF) (HCC)    a. 03/2020 Echo: EF 55-60%, no rwma, GrII DD, mild Ao sclerosis; b. 02/2022 Echo: EF 60-65%, no rwma, GrI DD, nl RV fxn, Ao sclerosis w/o stenosis.   Complication of anesthesia     a.) delayed emergence; b.) postopertive hypotension   Dysphagia    Dyspnea on exertion    Family history of adverse reaction to anesthesia    a.) PONV in first degree (mother) and second degree (maternal aunt) relatives   Former smoker    GERD (gastroesophageal reflux disease)    Heart murmur    Hemorrhoids    History of bilateral cataract extraction    History of ITP 1983   Hyperlipidemia    Hypertension    Laryngopharyngeal reflux (LPR)    Morbid obesity (HCC)    OSA on CPAP    PONV (postoperative nausea and vomiting)    Pre-diabetes    Pulmonary nodules    Rectal tear    Rectocele    Skin cancer    Varicose veins of lower extremity    Past Surgical History:  Procedure Laterality Date   ABDOMINAL HYSTERECTOMY     ARTHOSCOPIC ROTAOR CUFF REPAIR Right    BASAL CELL CARCINOMA EXCISION  10/23/2022   BLADDER REPAIR     CATARACT EXTRACTION Bilateral    CHOLECYSTECTOMY     COLONOSCOPY WITH PROPOFOL N/A 07/22/2016   Procedure: COLONOSCOPY WITH PROPOFOL;  Surgeon: Scot Jun, MD;  Location: [facility] ENDOSCOPY;  Service: Endoscopy;  Laterality: N/A;   ESOPHAGOGASTRODUODENOSCOPY (EGD) WITH PROPOFOL N/A 07/22/2016   Procedure: ESOPHAGOGASTRODUODENOSCOPY (EGD) WITH PROPOFOL;  Surgeon: Scot Jun, MD;  Location: [facility] ENDOSCOPY;  Service: Endoscopy;  Laterality: N/A;   HEMORRHOID SURGERY     HERNIA REPAIR     umbilical/ventral x3   SKIN CANCER EXCISION     basal cell carcinoma removed from forehead   SKIN CANCER EXCISION Right 09/23/2023   Right Shoulder   SPLENECTOMY     TUBAL LIGATION     Family History  Problem Relation Age of Onset   Varicose Veins Mother    Atrial fibrillation Mother    Cancer Father    Atrial fibrillation Brother    Aortic aneurysm Brother    Congenital heart disease Brother    Aortic aneurysm Brother    Valvular heart disease Brother    Varicose Veins Maternal Grandmother    Social History   Tobacco Use   Smoking status: Former     Current packs/day: 0.00    Average packs/day: 2.0 packs/day for 25.0 years (50.0 ttl pk-yrs)    Types: Cigarettes    Start date: 08/15/1968    Quit date: 08/15/1993    Years since quitting: 30.3   Smokeless tobacco: Never   Tobacco comments:    Quit smoking 25 years ago. 2 packs a day at most   Substance Use Topics   Alcohol use: No   Pertinent Clinical Results:  LABS:  Hospital Outpatient Visit on 12/25/2023  Component Date Value Ref Range Status   CRP 12/25/2023 <0.5  <1.0 mg/dL Final   Performed at [facility], [address], [place], [place] [address]   Sed Rate 12/25/2023 40 (H)  0 - 30 mm/hr Final   Performed at [facility], [address] [place], [place] [address]   MRSA, PCR 12/25/2023 NEGATIVE  NEGATIVE Final  Staphylococcus aureus 12/25/2023 NEGATIVE  NEGATIVE Final   Comment: (NOTE) The Xpert SA Assay (FDA approved for NASAL specimens in patients 46 years of age and older), is one component of a comprehensive surveillance program. It is not intended to diagnose infection nor to guide or monitor treatment. Performed at [facility], [address] [place], [place] [address]    WBC 12/25/2023 10.1  4.0 - 10.5 K/uL Final   RBC 12/25/2023 4.38  3.87 - 5.11 MIL/uL Final   Hemoglobin 12/25/2023 13.6  12.0 - 15.0 g/dL Final   HCT 60/45/4098 40.7  36.0 - 46.0 % Final   MCV 12/25/2023 92.9  80.0 - 100.0 fL Final   MCH 12/25/2023 31.1  26.0 - 34.0 pg Final   MCHC 12/25/2023 33.4  30.0 - 36.0 g/dL Final   RDW 11/91/4782 12.9  11.5 - 15.5 % Final   Platelets 12/25/2023 340  150 - 400 K/uL Final   nRBC 12/25/2023 0.0  0.0 - 0.2 % Final   Performed at [facility], [address] [place], [place] [address]   Sodium 12/25/2023 140  135 - 145 mmol/L Final   Potassium 12/25/2023 3.3 (L)  3.5 - 5.1 mmol/L Final   Chloride 12/25/2023 100  98 - 111 mmol/L Final   CO2 12/25/2023 27  22 - 32 mmol/L Final   Glucose, Bld 12/25/2023 94  70 - 99  mg/dL Final   Glucose reference range applies only to samples taken after fasting for at least 8 hours.   BUN 12/25/2023 12  8 - 23 mg/dL Final   Creatinine, Ser 12/25/2023 0.82  0.44 - 1.00 mg/dL Final   Calcium 30/86/5784 10.1  8.9 - 10.3 mg/dL Final   Total Protein 69/62/9528 8.0  6.5 - 8.1 g/dL Final   Albumin 41/32/4401 4.6  3.5 - 5.0 g/dL Final   AST 02/72/5366 30  15 - 41 U/L Final   ALT 12/25/2023 25  0 - 44 U/L Final   Alkaline Phosphatase 12/25/2023 71  38 - 126 U/L Final   Total Bilirubin 12/25/2023 0.6  0.0 - 1.2 mg/dL Final   GFR, Estimated 12/25/2023 >60  >60 mL/min Final   Comment: (NOTE) Calculated using the CKD-EPI Creatinine Equation (2021)    Anion gap 12/25/2023 13  5 - 15 Final   Performed at [facility], [address] [place], [place] [address]   Color, Urine 12/25/2023 YELLOW (A)  YELLOW Final   APPearance 12/25/2023 HAZY (A)  CLEAR Final   Specific Gravity, Urine 12/25/2023 1.011  1.005 - 1.030 Final   pH 12/25/2023 7.0  5.0 - 8.0 Final   Glucose, UA 12/25/2023 NEGATIVE  NEGATIVE mg/dL Final   Hgb urine dipstick 12/25/2023 NEGATIVE  NEGATIVE Final   Bilirubin Urine 12/25/2023 NEGATIVE  NEGATIVE Final   Ketones, ur 12/25/2023 NEGATIVE  NEGATIVE mg/dL Final   Protein, ur 74/25/9563 NEGATIVE  NEGATIVE mg/dL Final   Nitrite 87/56/4332 NEGATIVE  NEGATIVE Final   Leukocytes,Ua 12/25/2023 MODERATE (A)  NEGATIVE Final   RBC / HPF 12/25/2023 0-5  0 - 5 RBC/hpf Final   WBC, UA 12/25/2023 21-50  0 - 5 WBC/hpf Final   Bacteria, UA 12/25/2023 NONE SEEN  NONE SEEN Final   Squamous Epithelial / HPF 12/25/2023 0-5  0 - 5 /HPF Final   Performed at [facility], [address] [place], [place] [address]   Specimen Description 12/25/2023    Final  Value:URINE, CLEAN CATCH Performed at [facility], [address] [place], [place] [address]    Special Requests 12/25/2023    Final                   Value:NONE Performed at  [facility], [address] [place], [place] [address]    Culture 12/25/2023  (A)   Final                   Value:<10,000 COLONIES/mL INSIGNIFICANT GROWTH Performed at [facility], [address] [place], [place] [address]     ECG: Date: 12/25/2023  Time ECG obtained: 1109 AM Rate: 67 bpm Rhythm:  SR with PACs Axis (leads I and aVF): normal Intervals: PR 174 ms. QRS 78 ms. QTc 422 ms. ST segment and T wave changes: No evidence of acute T wave abnormalities or significant ST segment elevation or depression.  Comparison: Similar to previous tracing obtained on 11/15/2022. Atrial ectopy new.    IMAGING / PROCEDURES: DIAGNOSTIC RADIOGRAPHS OF LEFT  KNEE 3 VIEWS performed on 07/29/2023 Near bone-on-bone to the medial compartment space.   She is noted to have osteophytes both to the medial and lateral compartments.   No subchondral sclerosis noted.   Patella tracking well.   PULMONARY FUNCTION TESTING performed on 12/11/2023    Latest Ref Rng & Units 12/11/2023    2:40 PM 06/06/2022    3:56 PM  PFT Results  FVC-Pre L 2.09  2.25   FVC-Predicted Pre % 77  80   FVC-Post L 2.29  2.34   FVC-Predicted Post % 84  83   Pre FEV1/FVC % % 61  62   Post FEV1/FCV % % 62  63   FEV1-Pre L 1.29  1.39   FEV1-Predicted Pre % 62  65   FEV1-Post L 1.43  1.47   DLCO uncorrected ml/min/mmHg 16.93  15.12   DLCO UNC% % 93  82   DLVA Predicted % 89  83   TLC L 5.74  4.64   TLC % Predicted % 120  97   RV % Predicted % 153  101    TRANSTHORACIC ECHOCARDIOGRAM performed on 03/01/2022 Left ventricular ejection fraction, by estimation, is 60 to 65%. The  left ventricle has normal function. The left ventricle has no regional  wall motion abnormalities. There is mild left ventricular hypertrophy.  Left ventricular diastolic parameters are consistent with Grade I diastolic dysfunction (impaired relaxation).  Right ventricular systolic function is normal. The right ventricular size is  normal.  The mitral valve is normal in structure. No evidence of mitral valve regurgitation. No evidence of mitral stenosis.  The aortic valve is normal in structure. Aortic valve regurgitation is not visualized. Aortic valve sclerosis is present, with no evidence of aortic valve stenosis.  The inferior vena cava is normal in size with greater than 50% respiratory variability, suggesting right atrial pressure of 3 mmHg.   MYOCARDIAL PERFUSION IMAGING STUDY (LEXISCAN) performed on 05/15/2020 There was no ST segment deviation noted during stress. No T wave inversion was noted during stress. The study is normal. The left ventricular ejection fraction is normal (55-65%). Suboptimal study due to GI uptake This is a low risk study.  Impression and Plan:  Rhonda Oconnell has been referred for pre-anesthesia review and clearance prior to her undergoing the planned anesthetic and procedural courses. Available labs, pertinent testing, and imaging results were personally reviewed by me in preparation for upcoming operative/procedural  course. [facility] medical record has been updated following extensive record review and patient interview with PAT staff.   This patient has been appropriately cleared by cardiology with an overall ACCEPTABLE risk of experiencing significant perioperative cardiovascular complications. Based on clinical review performed today (12/30/23), barring any significant acute changes in the patient's overall condition, it is anticipated that she will be able to proceed with the planned surgical intervention. Any acute changes in clinical condition may necessitate her procedure being postponed and/or cancelled. Patient will meet with anesthesia team (MD and/or CRNA) on the day of her procedure for preoperative evaluation/assessment. Questions regarding anesthetic course will be fielded at that time.   Pre-surgical instructions were reviewed with the patient during his PAT appointment, and  questions were fielded to satisfaction by PAT clinical staff. She has been instructed on which medications that she will need to hold prior to surgery, as well as the ones that have been deemed safe/appropriate to take on the day of his procedure. As part of the general education provided by PAT, patient made aware both verbally and in writing, that she would need to abstain from the use of any illegal substances during his perioperative course. She was advised that failure to follow the provided instructions could necessitate case cancellation or result in serious perioperative complications up to and including death. Patient encouraged to contact PAT and/or her surgeon's office to discuss any questions or concerns that may arise prior to surgery; verbalized understanding.   Quentin Mulling, MSN, APRN, FNP-C, CEN [facility]  Perioperative Services Nurse Practitioner Phone: [phone] Fax: [phone] 12/30/23 10:14 AM  NOTE: This note has been prepared using Scientist, clinical (histocompatibility and immunogenetics). Despite my best ability to proofread, there is always the potential that unintentional transcriptional errors may still occur from this process.

## 2023-12-31 ENCOUNTER — Other Ambulatory Visit: Payer: Self-pay

## 2023-12-31 ENCOUNTER — Ambulatory Visit: Payer: Self-pay | Admitting: Urgent Care

## 2023-12-31 ENCOUNTER — Observation Stay
Admission: RE | Admit: 2023-12-31 | Discharge: 2024-01-01 | Disposition: A | Discharge status: Home / Self Care | Payer: Medicare Other | Source: Ambulatory Visit | Attending: Orthopedic Surgery | Admitting: Orthopedic Surgery

## 2023-12-31 ENCOUNTER — Encounter
Admission: RE | Disposition: A | Discharge status: Home / Self Care | Payer: Self-pay | Source: Ambulatory Visit | Attending: Orthopedic Surgery

## 2023-12-31 ENCOUNTER — Ambulatory Visit: Payer: Medicare Other | Admitting: Urgent Care

## 2023-12-31 ENCOUNTER — Other Ambulatory Visit: Payer: Self-pay | Admitting: Pulmonary Disease

## 2023-12-31 ENCOUNTER — Encounter: Payer: Self-pay | Admitting: Orthopedic Surgery

## 2023-12-31 ENCOUNTER — Observation Stay: Payer: Medicare Other

## 2023-12-31 DIAGNOSIS — I5032 Chronic diastolic (congestive) heart failure: Secondary | ICD-10-CM | POA: Insufficient documentation

## 2023-12-31 DIAGNOSIS — Z01818 Encounter for other preprocedural examination: Principal | ICD-10-CM

## 2023-12-31 DIAGNOSIS — I110 Hypertensive heart disease with heart failure: Secondary | ICD-10-CM | POA: Insufficient documentation

## 2023-12-31 DIAGNOSIS — M1712 Unilateral primary osteoarthritis, left knee: Secondary | ICD-10-CM | POA: Diagnosis present

## 2023-12-31 DIAGNOSIS — Z96652 Presence of left artificial knee joint: Secondary | ICD-10-CM

## 2023-12-31 DIAGNOSIS — J449 Chronic obstructive pulmonary disease, unspecified: Secondary | ICD-10-CM | POA: Insufficient documentation

## 2023-12-31 DIAGNOSIS — Z79899 Other long term (current) drug therapy: Secondary | ICD-10-CM | POA: Insufficient documentation

## 2023-12-31 DIAGNOSIS — Z862 Personal history of diseases of the blood and blood-forming organs and certain disorders involving the immune mechanism: Secondary | ICD-10-CM

## 2023-12-31 HISTORY — DX: Unspecified malignant neoplasm of skin, unspecified: C44.90

## 2023-12-31 HISTORY — DX: Other forms of dyspnea: R06.09

## 2023-12-31 HISTORY — DX: Atherosclerosis of aorta: I70.0

## 2023-12-31 HISTORY — DX: Other nonspecific abnormal finding of lung field: R91.8

## 2023-12-31 HISTORY — DX: Cataract extraction status, right eye: Z98.41

## 2023-12-31 HISTORY — DX: Other specified chronic obstructive pulmonary disease: J44.89

## 2023-12-31 HISTORY — PX: KNEE ARTHROPLASTY: SHX992

## 2023-12-31 HISTORY — DX: Benign neoplasm of right adrenal gland: D35.01

## 2023-12-31 SURGERY — ARTHROPLASTY, KNEE, TOTAL, USING IMAGELESS COMPUTER-ASSISTED NAVIGATION
Anesthesia: Spinal | Site: Knee | Laterality: Left

## 2023-12-31 MED ORDER — OXYCODONE HCL 5 MG PO TABS
ORAL_TABLET | ORAL | Status: AC
  Filled 2023-12-31: qty 1

## 2023-12-31 MED ORDER — DEXMEDETOMIDINE HCL IN NACL 80 MCG/20ML IV SOLN
INTRAVENOUS | Status: AC
Start: 2023-12-31 — End: ?
  Filled 2023-12-31: qty 20

## 2023-12-31 MED ORDER — ONDANSETRON HCL 4 MG/2ML IJ SOLN
INTRAMUSCULAR | Status: AC
  Filled 2023-12-31: qty 2

## 2023-12-31 MED ORDER — GLYCOPYRROLATE 0.2 MG/ML IJ SOLN
INTRAMUSCULAR | Status: DC | PRN
  Administered 2023-12-31: .2 mg via INTRAVENOUS

## 2023-12-31 MED ORDER — TRAMADOL HCL 50 MG PO TABS
50.0000 mg | ORAL_TABLET | ORAL | Status: DC | PRN
Start: 2023-12-31 — End: 2024-01-01
  Administered 2023-12-31: 50 mg via ORAL

## 2023-12-31 MED ORDER — BUPIVACAINE HCL (PF) 0.5 % IJ SOLN
INTRAMUSCULAR | Status: DC | PRN
  Administered 2023-12-31: 2.8 mL via INTRATHECAL

## 2023-12-31 MED ORDER — MIDAZOLAM HCL 5 MG/5ML IJ SOLN
INTRAMUSCULAR | Status: DC | PRN
  Administered 2023-12-31: 2 mg via INTRAVENOUS

## 2023-12-31 MED ORDER — ASPIRIN 81 MG PO CHEW
CHEWABLE_TABLET | ORAL | Status: AC
  Filled 2023-12-31: qty 1

## 2023-12-31 MED ORDER — HYDROCHLOROTHIAZIDE 25 MG PO TABS
25.0000 mg | ORAL_TABLET | ORAL | Status: DC
  Administered 2024-01-01: 25 mg via ORAL

## 2023-12-31 MED ORDER — OXYCODONE HCL 5 MG PO TABS
5.0000 mg | ORAL_TABLET | ORAL | Status: DC | PRN
  Administered 2023-12-31 – 2024-01-01 (×4): 5 mg via ORAL

## 2023-12-31 MED ORDER — TRANEXAMIC ACID-NACL 1000-0.7 MG/100ML-% IV SOLN
INTRAVENOUS | Status: AC
  Filled 2023-12-31: qty 100

## 2023-12-31 MED ORDER — MAGNESIUM HYDROXIDE 400 MG/5ML PO SUSP
30.0000 mL | Freq: Every day | ORAL | Status: DC
  Administered 2024-01-01: 30 mL via ORAL

## 2023-12-31 MED ORDER — PHENYLEPHRINE HCL-NACL 20-0.9 MG/250ML-% IV SOLN
INTRAVENOUS | Status: DC | PRN
  Administered 2023-12-31: 30 ug/min via INTRAVENOUS

## 2023-12-31 MED ORDER — PROPOFOL 500 MG/50ML IV EMUL
INTRAVENOUS | Status: DC | PRN
  Administered 2023-12-31: 100 ug/kg/min via INTRAVENOUS

## 2023-12-31 MED ORDER — ACETAMINOPHEN 10 MG/ML IV SOLN
INTRAVENOUS | Status: AC
  Filled 2023-12-31: qty 100

## 2023-12-31 MED ORDER — CHLORHEXIDINE GLUCONATE 4 % EX SOLN: 60.0000 mL | Freq: Once | CUTANEOUS | Status: DC

## 2023-12-31 MED ORDER — ATORVASTATIN CALCIUM 10 MG PO TABS
ORAL_TABLET | ORAL | Status: AC
Start: 2023-12-31 — End: ?
  Filled 2023-12-31: qty 2

## 2023-12-31 MED ORDER — ALBUTEROL SULFATE (2.5 MG/3ML) 0.083% IN NEBU: 2.5000 mg | INHALATION_SOLUTION | Freq: Four times a day (QID) | RESPIRATORY_TRACT | Status: DC | PRN

## 2023-12-31 MED ORDER — DEXMEDETOMIDINE HCL IN NACL 80 MCG/20ML IV SOLN
INTRAVENOUS | Status: DC | PRN
  Administered 2023-12-31 (×5): 4 ug via INTRAVENOUS

## 2023-12-31 MED ORDER — PROPOFOL 10 MG/ML IV BOLUS
INTRAVENOUS | Status: DC | PRN
  Administered 2023-12-31: 20 mg via INTRAVENOUS

## 2023-12-31 MED ORDER — CELECOXIB 200 MG PO CAPS
ORAL_CAPSULE | ORAL | Status: AC
  Filled 2023-12-31: qty 1

## 2023-12-31 MED ORDER — LIDOCAINE HCL (CARDIAC) PF 100 MG/5ML IV SOSY
PREFILLED_SYRINGE | INTRAVENOUS | Status: DC | PRN
  Administered 2023-12-31: 30 mg via INTRAVENOUS
  Administered 2023-12-31: 20 mg via INTRAVENOUS

## 2023-12-31 MED ORDER — AZELASTINE HCL 0.1 % NA SOLN: 2.0000 | Freq: Two times a day (BID) | NASAL | Status: DC | PRN

## 2023-12-31 MED ORDER — TRANEXAMIC ACID-NACL 1000-0.7 MG/100ML-% IV SOLN
1000.0000 mg | Freq: Once | INTRAVENOUS | Status: AC
  Administered 2023-12-31: 1000 mg via INTRAVENOUS

## 2023-12-31 MED ORDER — ACETAMINOPHEN 10 MG/ML IV SOLN
1000.0000 mg | Freq: Four times a day (QID) | INTRAVENOUS | Status: DC
  Administered 2023-12-31 – 2024-01-01 (×2): 1000 mg via INTRAVENOUS

## 2023-12-31 MED ORDER — SODIUM CHLORIDE 0.9 % IR SOLN
Status: DC | PRN
  Administered 2023-12-31: 3000 mL

## 2023-12-31 MED ORDER — MONTELUKAST SODIUM 10 MG PO TABS
10.0000 mg | ORAL_TABLET | Freq: Every day | ORAL | Status: DC
  Administered 2023-12-31: 10 mg via ORAL

## 2023-12-31 MED ORDER — OXYCODONE HCL 5 MG/5ML PO SOLN: 5.0000 mg | Freq: Once | ORAL | Status: AC | PRN

## 2023-12-31 MED ORDER — FENTANYL CITRATE (PF) 100 MCG/2ML IJ SOLN
25.0000 ug | INTRAMUSCULAR | Status: DC | PRN
  Administered 2023-12-31 (×2): 25 ug via INTRAVENOUS
  Administered 2023-12-31: 50 ug via INTRAVENOUS

## 2023-12-31 MED ORDER — SENNOSIDES-DOCUSATE SODIUM 8.6-50 MG PO TABS
ORAL_TABLET | ORAL | Status: AC
  Filled 2023-12-31: qty 1

## 2023-12-31 MED ORDER — FENTANYL CITRATE (PF) 100 MCG/2ML IJ SOLN
INTRAMUSCULAR | Status: AC
  Filled 2023-12-31: qty 2

## 2023-12-31 MED ORDER — PROPOFOL 1000 MG/100ML IV EMUL
INTRAVENOUS | Status: AC
  Filled 2023-12-31: qty 100

## 2023-12-31 MED ORDER — OXYCODONE HCL 5 MG PO TABS: 10.0000 mg | ORAL_TABLET | ORAL | Status: DC | PRN

## 2023-12-31 MED ORDER — DEXAMETHASONE SODIUM PHOSPHATE 10 MG/ML IJ SOLN
INTRAMUSCULAR | Status: AC
  Filled 2023-12-31: qty 1

## 2023-12-31 MED ORDER — BUPIVACAINE HCL (PF) 0.5 % IJ SOLN
INTRAMUSCULAR | Status: AC
  Filled 2023-12-31: qty 10

## 2023-12-31 MED ORDER — LOSARTAN POTASSIUM 50 MG PO TABS
ORAL_TABLET | ORAL | Status: AC
Start: 2023-12-31 — End: ?
  Filled 2023-12-31: qty 4

## 2023-12-31 MED ORDER — CHLORHEXIDINE GLUCONATE 0.12 % MT SOLN
15.0000 mL | Freq: Once | OROMUCOSAL | Status: AC
  Administered 2023-12-31: 15 mL via OROMUCOSAL

## 2023-12-31 MED ORDER — PROPOFOL 10 MG/ML IV BOLUS
INTRAVENOUS | Status: AC
  Filled 2023-12-31: qty 20

## 2023-12-31 MED ORDER — GABAPENTIN 300 MG PO CAPS
ORAL_CAPSULE | ORAL | Status: AC
  Filled 2023-12-31: qty 1

## 2023-12-31 MED ORDER — ONDANSETRON HCL 4 MG/2ML IJ SOLN: 4.0000 mg | Freq: Four times a day (QID) | INTRAMUSCULAR | Status: DC | PRN

## 2023-12-31 MED ORDER — BISACODYL 10 MG RE SUPP: 10.0000 mg | Freq: Every day | RECTAL | Status: DC | PRN

## 2023-12-31 MED ORDER — ORAL CARE MOUTH RINSE
15.0000 mL | Freq: Once | OROMUCOSAL | Status: AC
Start: 2023-12-31 — End: 2023-12-31

## 2023-12-31 MED ORDER — CEFAZOLIN SODIUM-DEXTROSE 2-4 GM/100ML-% IV SOLN
2.0000 g | Freq: Four times a day (QID) | INTRAVENOUS | Status: AC
  Administered 2023-12-31 – 2024-01-01 (×2): 2 g via INTRAVENOUS

## 2023-12-31 MED ORDER — CEFAZOLIN SODIUM-DEXTROSE 2-4 GM/100ML-% IV SOLN
INTRAVENOUS | Status: AC
  Filled 2023-12-31: qty 100

## 2023-12-31 MED ORDER — DEXAMETHASONE SODIUM PHOSPHATE 10 MG/ML IJ SOLN
8.0000 mg | Freq: Once | INTRAMUSCULAR | Status: AC
  Administered 2023-12-31: 8 mg via INTRAVENOUS

## 2023-12-31 MED ORDER — ORAL CARE MOUTH RINSE: 15.0000 mL | OROMUCOSAL | Status: DC | PRN

## 2023-12-31 MED ORDER — ALBUTEROL SULFATE HFA 108 (90 BASE) MCG/ACT IN AERS: 2.0000 | INHALATION_SPRAY | Freq: Four times a day (QID) | RESPIRATORY_TRACT | Status: DC | PRN

## 2023-12-31 MED ORDER — LACTATED RINGERS IV SOLN: INTRAVENOUS | Status: DC

## 2023-12-31 MED ORDER — GABAPENTIN 300 MG PO CAPS
300.0000 mg | ORAL_CAPSULE | Freq: Once | ORAL | Status: AC
  Administered 2023-12-31: 300 mg via ORAL

## 2023-12-31 MED ORDER — ASPIRIN 81 MG PO CHEW
81.0000 mg | CHEWABLE_TABLET | Freq: Two times a day (BID) | ORAL | Status: DC
  Administered 2023-12-31 – 2024-01-01 (×2): 81 mg via ORAL

## 2023-12-31 MED ORDER — ATORVASTATIN CALCIUM 10 MG PO TABS
20.0000 mg | ORAL_TABLET | Freq: Every day | ORAL | Status: DC
  Administered 2023-12-31: 20 mg via ORAL

## 2023-12-31 MED ORDER — OXYCODONE HCL 5 MG PO TABS
5.0000 mg | ORAL_TABLET | Freq: Once | ORAL | Status: AC | PRN
  Administered 2023-12-31: 5 mg via ORAL

## 2023-12-31 MED ORDER — CELECOXIB 200 MG PO CAPS
ORAL_CAPSULE | ORAL | Status: AC
  Filled 2023-12-31: qty 2

## 2023-12-31 MED ORDER — FERROUS SULFATE 325 (65 FE) MG PO TABS
325.0000 mg | ORAL_TABLET | Freq: Two times a day (BID) | ORAL | Status: DC
  Administered 2024-01-01: 325 mg via ORAL

## 2023-12-31 MED ORDER — TRAMADOL HCL 50 MG PO TABS
ORAL_TABLET | ORAL | Status: AC
  Filled 2023-12-31: qty 1

## 2023-12-31 MED ORDER — PANTOPRAZOLE SODIUM 40 MG PO TBEC
DELAYED_RELEASE_TABLET | ORAL | Status: AC
  Filled 2023-12-31: qty 1

## 2023-12-31 MED ORDER — UMECLIDINIUM BROMIDE 62.5 MCG/ACT IN AEPB
1.0000 | INHALATION_SPRAY | Freq: Every day | RESPIRATORY_TRACT | Status: DC
Start: 2024-01-01 — End: 2024-01-01
  Filled 2023-12-31: qty 7

## 2023-12-31 MED ORDER — MIDAZOLAM HCL 2 MG/2ML IJ SOLN
INTRAMUSCULAR | Status: AC
  Filled 2023-12-31: qty 2

## 2023-12-31 MED ORDER — ENSURE PRE-SURGERY PO LIQD
296.0000 mL | Freq: Once | ORAL | Status: DC
  Filled 2023-12-31: qty 296

## 2023-12-31 MED ORDER — CELECOXIB 200 MG PO CAPS
400.0000 mg | ORAL_CAPSULE | Freq: Once | ORAL | Status: AC
  Administered 2023-12-31: 400 mg via ORAL

## 2023-12-31 MED ORDER — HYDROMORPHONE HCL 1 MG/ML IJ SOLN
0.5000 mg | INTRAMUSCULAR | Status: DC | PRN
Start: 2023-12-31 — End: 2024-01-01

## 2023-12-31 MED ORDER — DIPHENHYDRAMINE HCL 12.5 MG/5ML PO ELIX: 12.5000 mg | ORAL_SOLUTION | ORAL | Status: DC | PRN

## 2023-12-31 MED ORDER — LORATADINE 10 MG PO TABS
10.0000 mg | ORAL_TABLET | Freq: Every day | ORAL | Status: DC
  Administered 2024-01-01: 10 mg via ORAL

## 2023-12-31 MED ORDER — METOCLOPRAMIDE HCL 10 MG PO TABS
10.0000 mg | ORAL_TABLET | Freq: Three times a day (TID) | ORAL | Status: DC
  Administered 2023-12-31 – 2024-01-01 (×3): 10 mg via ORAL

## 2023-12-31 MED ORDER — SURGIPHOR WOUND IRRIGATION SYSTEM - OPTIME
TOPICAL | Status: DC | PRN
  Administered 2023-12-31: 450 mL

## 2023-12-31 MED ORDER — CELECOXIB 200 MG PO CAPS
200.0000 mg | ORAL_CAPSULE | Freq: Two times a day (BID) | ORAL | Status: DC
  Administered 2023-12-31 – 2024-01-01 (×2): 200 mg via ORAL

## 2023-12-31 MED ORDER — FENTANYL CITRATE (PF) 100 MCG/2ML IJ SOLN
INTRAMUSCULAR | Status: DC | PRN
  Administered 2023-12-31 (×2): 50 ug via INTRAVENOUS

## 2023-12-31 MED ORDER — FLEET ENEMA RE ENEM: 1.0000 | ENEMA | Freq: Once | RECTAL | Status: DC | PRN

## 2023-12-31 MED ORDER — MENTHOL 3 MG MT LOZG
1.0000 | LOZENGE | OROMUCOSAL | Status: DC | PRN
Start: 2023-12-31 — End: 2024-01-01

## 2023-12-31 MED ORDER — CHLORHEXIDINE GLUCONATE 0.12 % MT SOLN
OROMUCOSAL | Status: AC
  Filled 2023-12-31: qty 15

## 2023-12-31 MED ORDER — ALUM & MAG HYDROXIDE-SIMETH 200-200-20 MG/5ML PO SUSP: 30.0000 mL | ORAL | Status: DC | PRN

## 2023-12-31 MED ORDER — MONTELUKAST SODIUM 10 MG PO TABS
ORAL_TABLET | ORAL | Status: AC
  Filled 2023-12-31: qty 1

## 2023-12-31 MED ORDER — SENNOSIDES-DOCUSATE SODIUM 8.6-50 MG PO TABS
1.0000 | ORAL_TABLET | Freq: Two times a day (BID) | ORAL | Status: DC
  Administered 2023-12-31 – 2024-01-01 (×2): 1 via ORAL

## 2023-12-31 MED ORDER — ONDANSETRON HCL 4 MG PO TABS: 4.0000 mg | ORAL_TABLET | Freq: Four times a day (QID) | ORAL | Status: DC | PRN

## 2023-12-31 MED ORDER — CEFAZOLIN SODIUM-DEXTROSE 2-4 GM/100ML-% IV SOLN
2.0000 g | INTRAVENOUS | Status: AC
  Administered 2023-12-31: 2 g via INTRAVENOUS

## 2023-12-31 MED ORDER — PHENOL 1.4 % MT LIQD: 1.0000 | OROMUCOSAL | Status: DC | PRN

## 2023-12-31 MED ORDER — ONDANSETRON HCL 4 MG/2ML IJ SOLN
INTRAMUSCULAR | Status: DC | PRN
  Administered 2023-12-31: 4 mg via INTRAVENOUS

## 2023-12-31 MED ORDER — ACETAMINOPHEN 10 MG/ML IV SOLN
INTRAVENOUS | Status: DC | PRN
  Administered 2023-12-31: 1000 mg via INTRAVENOUS

## 2023-12-31 MED ORDER — BUDESON-GLYCOPYRROL-FORMOTEROL 160-9-4.8 MCG/ACT IN AERO: 2.0000 | INHALATION_SPRAY | Freq: Two times a day (BID) | RESPIRATORY_TRACT | Status: DC

## 2023-12-31 MED ORDER — LOSARTAN POTASSIUM 50 MG PO TABS
100.0000 mg | ORAL_TABLET | Freq: Every day | ORAL | Status: DC
  Administered 2023-12-31: 100 mg via ORAL

## 2023-12-31 MED ORDER — SODIUM CHLORIDE 0.9 % IV SOLN
INTRAVENOUS | Status: DC
  Administered 2023-12-31: 1000 mL via INTRAVENOUS

## 2023-12-31 MED ORDER — ACETAMINOPHEN 325 MG PO TABS: 325.0000 mg | ORAL_TABLET | Freq: Four times a day (QID) | ORAL | Status: DC | PRN

## 2023-12-31 MED ORDER — METOCLOPRAMIDE HCL 10 MG PO TABS
ORAL_TABLET | ORAL | Status: AC
  Filled 2023-12-31: qty 1

## 2023-12-31 MED ORDER — SODIUM CHLORIDE (PF) 0.9 % IJ SOLN
INTRAMUSCULAR | Status: DC | PRN
  Administered 2023-12-31: 120 mL

## 2023-12-31 MED ORDER — FLUTICASONE FUROATE-VILANTEROL 200-25 MCG/ACT IN AEPB
1.0000 | INHALATION_SPRAY | Freq: Every day | RESPIRATORY_TRACT | Status: DC
  Filled 2023-12-31: qty 28

## 2023-12-31 MED ORDER — TRANEXAMIC ACID-NACL 1000-0.7 MG/100ML-% IV SOLN
1000.0000 mg | INTRAVENOUS | Status: AC
  Administered 2023-12-31: 1000 mg via INTRAVENOUS

## 2023-12-31 MED ORDER — PANTOPRAZOLE SODIUM 40 MG PO TBEC
40.0000 mg | DELAYED_RELEASE_TABLET | Freq: Two times a day (BID) | ORAL | Status: DC
  Administered 2023-12-31 – 2024-01-01 (×2): 40 mg via ORAL

## 2023-12-31 SURGICAL SUPPLY — 65 items
ATTUNE PSFEM LTSZ5 NARCEM KNEE (Femur) IMPLANT
ATTUNE PSRP INSR SZ5 5 KNEE (Insert) IMPLANT
BASEPLATE TIBIAL ROTATING SZ 4 (Knees) IMPLANT
BATTERY INSTRU NAVIGATION (MISCELLANEOUS) ×4 IMPLANT
BIT DRILL QUICK REL 1/8 2PK SL (BIT) ×1 IMPLANT
BLADE CLIPPER SURG (BLADE) IMPLANT
BLADE SAW 70X12.5 (BLADE) ×1 IMPLANT
BLADE SAW 90X13X1.19 OSCILLAT (BLADE) ×1 IMPLANT
BLADE SAW 90X25X1.19 OSCILLAT (BLADE) ×1 IMPLANT
BONE CEMENT GENTAMICIN (Cement) ×2 IMPLANT
BRUSH SCRUB EZ PLAIN DRY (MISCELLANEOUS) ×1 IMPLANT
CEMENT BONE GENTAMICIN 40 (Cement) IMPLANT
COOLER POLAR GLACIER W/PUMP (MISCELLANEOUS) ×1 IMPLANT
CUFF TRNQT CYL 24X4X16.5-23 (TOURNIQUET CUFF) IMPLANT
CUFF TRNQT CYL 30X4X21-28X (TOURNIQUET CUFF) IMPLANT
DRAPE SHEET LG 3/4 BI-LAMINATE (DRAPES) ×1 IMPLANT
DRSG AQUACEL AG ADV 3.5X14 (GAUZE/BANDAGES/DRESSINGS) ×1 IMPLANT
DRSG MEPILEX SACRM 8.7X9.8 (GAUZE/BANDAGES/DRESSINGS) ×1 IMPLANT
DRSG TEGADERM 4X4.75 (GAUZE/BANDAGES/DRESSINGS) ×1 IMPLANT
DURAPREP 26ML APPLICATOR (WOUND CARE) ×2 IMPLANT
ELECT CAUTERY BLADE 6.4 (BLADE) ×1 IMPLANT
ELECT REM PT RETURN 9FT ADLT (ELECTROSURGICAL) ×1
ELECTRODE REM PT RTRN 9FT ADLT (ELECTROSURGICAL) ×1 IMPLANT
EVACUATOR 1/8 PVC DRAIN (DRAIN) ×1 IMPLANT
EX-PIN ORTHOLOCK NAV 4X150 (PIN) ×2 IMPLANT
GAUZE XEROFORM 1X8 LF (GAUZE/BANDAGES/DRESSINGS) ×1 IMPLANT
GLOVE BIOGEL M STRL SZ7.5 (GLOVE) ×6 IMPLANT
GLOVE SURG UNDER POLY LF SZ8 (GLOVE) ×2 IMPLANT
GOWN STRL REUS W/ TWL LRG LVL3 (GOWN DISPOSABLE) ×1 IMPLANT
GOWN STRL REUS W/ TWL XL LVL3 (GOWN DISPOSABLE) ×1 IMPLANT
GOWN TOGA ZIPPER T7+ PEEL AWAY (MISCELLANEOUS) ×1 IMPLANT
HOLDER FOLEY CATH W/STRAP (MISCELLANEOUS) ×1 IMPLANT
HOOD PEEL AWAY T7 (MISCELLANEOUS) ×1 IMPLANT
IV NS IRRIG 3000ML ARTHROMATIC (IV SOLUTION) ×1 IMPLANT
KIT TURNOVER KIT A (KITS) ×1 IMPLANT
KNIFE SCULPS 14X20 (INSTRUMENTS) ×1 IMPLANT
MANIFOLD NEPTUNE II (INSTRUMENTS) ×2 IMPLANT
NDL SPNL 20GX3.5 QUINCKE YW (NEEDLE) ×2 IMPLANT
NEEDLE SPNL 20GX3.5 QUINCKE YW (NEEDLE) ×2
PACK TOTAL KNEE (MISCELLANEOUS) ×1 IMPLANT
PAD ABD DERMACEA PRESS 5X9 (GAUZE/BANDAGES/DRESSINGS) ×2 IMPLANT
PAD ARMBOARD 7.5X6 YLW CONV (MISCELLANEOUS) ×3 IMPLANT
PAD WRAPON POLOR MULTI XL (MISCELLANEOUS) IMPLANT
PATELLA MEDIAL ATTUN 35MM KNEE (Knees) IMPLANT
PENCIL SMOKE EVACUATOR COATED (MISCELLANEOUS) ×1 IMPLANT
PIN DRILL FIX HALF THREAD (BIT) ×2 IMPLANT
PIN FIXATION 1/8DIA X 3INL (PIN) ×1 IMPLANT
PULSAVAC PLUS IRRIG FAN TIP (DISPOSABLE) ×1
SOLUTION IRRIG SURGIPHOR (IV SOLUTION) ×1 IMPLANT
SPONGE DRAIN TRACH 4X4 STRL 2S (GAUZE/BANDAGES/DRESSINGS) ×1 IMPLANT
STAPLER SKIN PROX 35W (STAPLE) ×1 IMPLANT
STOCKINETTE STRL BIAS CUT 8X4 (MISCELLANEOUS) ×1 IMPLANT
STRAP TIBIA SHORT (MISCELLANEOUS) ×1 IMPLANT
SUCTION TUBE FRAZIER 10FR DISP (SUCTIONS) ×1 IMPLANT
SUT VIC AB 0 CT1 36 (SUTURE) ×1 IMPLANT
SUT VIC AB 1 CT1 36 (SUTURE) ×2 IMPLANT
SUT VIC AB 2-0 CT2 27 (SUTURE) ×1 IMPLANT
SYR 30ML LL (SYRINGE) ×2 IMPLANT
TIP FAN IRRIG PULSAVAC PLUS (DISPOSABLE) ×1 IMPLANT
TOWEL OR 17X26 4PK STRL BLUE (TOWEL DISPOSABLE) ×1 IMPLANT
TOWER CARTRIDGE SMART MIX (DISPOSABLE) ×1 IMPLANT
TRAP FLUID SMOKE EVACUATOR (MISCELLANEOUS) ×1 IMPLANT
TRAY FOLEY MTR SLVR 16FR STAT (SET/KITS/TRAYS/PACK) ×1 IMPLANT
WATER STERILE IRR 1000ML POUR (IV SOLUTION) ×1 IMPLANT
WRAP-ON POLOR PAD MULTI XL (MISCELLANEOUS) ×1

## 2023-12-31 NOTE — Anesthesia Procedure Notes (Signed)
Spinal  Patient location during procedure: OR Start time: 12/31/2023 11:42 AM End time: 12/31/2023 11:43 AM Reason for block: surgical anesthesia Staffing Performed: resident/CRNA  Resident/CRNA: Monico Hoar, CRNA Performed by: Monico Hoar, CRNA Authorized by: Louie Boston, MD   Preanesthetic Checklist Completed: patient identified, IV checked, site marked, risks and benefits discussed, surgical consent, monitors and equipment checked, pre-op evaluation and timeout performed Spinal Block Patient position: sitting Prep: Betadine Patient monitoring: heart rate, continuous pulse ox, blood pressure and cardiac monitor Approach: midline Location: L4-5 Injection technique: single-shot Needle Needle type: Introducer and Pencan  Needle gauge: 25 G Needle length: 10 cm Assessment Sensory level: T6 Events: CSF return Additional Notes Negative paresthesia. Negative blood return. Positive free-flowing CSF. Expiration date of kit checked and confirmed. Patient tolerated procedure well, without complications.

## 2023-12-31 NOTE — Anesthesia Preprocedure Evaluation (Addendum)
Anesthesia Evaluation  Patient identified by MRN, date of birth, ID band Patient awake    Reviewed: Allergy & Precautions, NPO status , Patient's Chart, lab work & pertinent test results  History of Anesthesia Complications (+) PONV, PROLONGED EMERGENCE and history of anesthetic complications  Airway Mallampati: III  TM Distance: >3 FB Neck ROM: full    Dental  (+) Lower Dentures, Upper Dentures   Pulmonary asthma , sleep apnea and Continuous Positive Airway Pressure Ventilation , COPD,  COPD inhaler, former smoker   Pulmonary exam normal        Cardiovascular hypertension, On Medications Normal cardiovascular exam  Most recent TTE was performed on 03/01/2022 revealed a normal left ventricular systolic function with an EF of 60 to 65%.  There were no regional wall motion abnormalities.  Mild LVH was observed. Left ventricular diastolic Doppler parameters consistent with abnormal relaxation (G1DD).  Right ventricular size and function normal.  Aortic valve noted to be sclerotic. All transvalvular gradients were noted to be normal providing no evidence suggestive of valvular stenosis. Aorta normal in size with no evidence of ectasia or aneurysmal dilatation.   Neuro/Psych negative neurological ROS  negative psych ROS   GI/Hepatic Neg liver ROS,GERD  Medicated,,  Endo/Other  negative endocrine ROS    Renal/GU negative Renal ROS  negative genitourinary   Musculoskeletal   Abdominal   Peds  Hematology  (+) Blood dyscrasia, anemia   Anesthesia Other Findings Past Medical History: No date: Abdominal hernia No date: Adenoma of right adrenal gland No date: Allergic state No date: Anemia No date: Aortic atherosclerosis (HCC) No date: Arthritis No date: Asthma-COPD overlap syndrome (HCC) No date: Chest pain     Comment:  a. 04/2013 MV: Nl EF, no isch/infarct ; b. 05/2020 MV: EF               55-65%, no isch/infarct. No date:  Chronic heart failure with preserved ejection fraction  (HFpEF) (HCC)     Comment:  a. 03/2020 Echo: EF 55-60%, no rwma, GrII DD, mild Ao               sclerosis; b. 02/2022 Echo: EF 60-65%, no rwma, GrI DD, nl              RV fxn, Ao sclerosis w/o stenosis. 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 No date: Hyperlipidemia No date: Hypertension No date: Laryngopharyngeal reflux (LPR) No date: Morbid obesity (HCC) No date: OSA on CPAP No date: PONV (postoperative nausea and vomiting) No date: Pre-diabetes No date: Pulmonary nodules No date: Rectal tear No date: Rectocele No date: Skin cancer No date: Varicose veins of lower extremity  Past Surgical History: No date: ABDOMINAL HYSTERECTOMY No date: ARTHOSCOPIC ROTAOR CUFF REPAIR; Right 10/23/2022: BASAL CELL CARCINOMA EXCISION No date: BLADDER REPAIR No date: CATARACT EXTRACTION; Bilateral No date: CHOLECYSTECTOMY 07/22/2016: COLONOSCOPY WITH PROPOFOL; N/A     Comment:  Procedure: COLONOSCOPY WITH PROPOFOL;  Surgeon: Wilber Bihari  Mechele Collin, MD;  Location: [facility] ENDOSCOPY;  Service:               Endoscopy;  Laterality: N/A; 07/22/2016: ESOPHAGOGASTRODUODENOSCOPY (EGD) WITH PROPOFOL; N/A     Comment:  Procedure: ESOPHAGOGASTRODUODENOSCOPY (EGD) WITH               PROPOFOL;  Surgeon: Scot Jun, MD;  Location: [facility]              ENDOSCOPY;  Service: Endoscopy;  Laterality: N/A; No date: HEMORRHOID SURGERY No date: HERNIA REPAIR     Comment:  umbilical/ventral x3 No  date: SKIN CANCER EXCISION     Comment:  basal cell carcinoma removed from forehead 09/23/2023: SKIN CANCER EXCISION; Right     Comment:  Right Shoulder No date: SPLENECTOMY No date: TUBAL LIGATION     Reproductive/Obstetrics negative OB ROS                             Anesthesia Physical Anesthesia Plan  ASA: 3  Anesthesia Plan: Spinal   Post-op Pain Management: Regional block*, Gabapentin PO (pre-op)* and Celebrex PO (pre-op)*   Induction: Intravenous  PONV Risk Score and Plan: 3 and Propofol infusion, TIVA and Treatment may vary due to age or medical condition  Airway Management Planned: Natural Airway and Nasal Cannula  Additional Equipment:   Intra-op Plan:   Post-operative Plan:   Informed Consent: I have reviewed the patients History and Physical, chart, labs and discussed the procedure including the risks, benefits and alternatives for the proposed anesthesia with the patient or authorized representative who has indicated his/her understanding and acceptance.     Dental Advisory Given  Plan Discussed with: Anesthesiologist, CRNA and Surgeon  Anesthesia Plan Comments: (Patient reports no bleeding problems and no anticoagulant use.  Plan for spinal with backup GA  Patient consented for risks of anesthesia including but not limited to:  - adverse reactions to medications - damage to eyes, teeth, lips or other oral mucosa - nerve damage due to positioning  - risk of bleeding, infection and or nerve damage from spinal that could lead to paralysis - risk of headache or failed spinal - damage to teeth, lips or other oral mucosa - sore throat or hoarseness - damage to heart, brain, nerves, lungs, other parts of body or loss of life  Patient voiced understanding and assent.)        Anesthesia Quick Evaluation

## 2023-12-31 NOTE — Interval H&P Note (Signed)
History and Physical Interval Note:  12/31/2023 11:10 AM  Rhonda Oconnell  has presented today for surgery, with the diagnosis of PRIMARY OSTEOARTHRITIS OF LEFT KNEE..  The various methods of treatment have been discussed with the patient and family. After consideration of risks, benefits and other options for treatment, the patient has consented to  Procedure(s): COMPUTER ASSISTED TOTAL KNEE ARTHROPLASTY (Left) as a surgical intervention.  The patient's history has been reviewed, patient examined, no change in status, stable for surgery.  I have reviewed the patient's chart and labs.  Questions were answered to the patient's satisfaction.     Lurie Mullane P Damaria Stofko

## 2023-12-31 NOTE — Op Note (Signed)
OPERATIVE NOTE  DATE OF SURGERY:  12/31/2023  PATIENT NAME:  Rhonda Oconnell   DOB: 1951/08/26  MRN: [id number]  PRE-OPERATIVE DIAGNOSIS: Degenerative arthrosis of the left knee, primary  POST-OPERATIVE DIAGNOSIS:  Same  PROCEDURE:  Left total knee arthroplasty using computer-assisted navigation  SURGEON:  Jena Gauss. M.D.  ASSISTANT:  Gean Birchwood, PA-C (present and scrubbed throughout the case, critical for assistance with exposure, retraction, instrumentation, and closure)  ANESTHESIA: spinal  ESTIMATED BLOOD LOSS: 50 mL  FLUIDS REPLACED: 800 mL of crystalloid  TOURNIQUET TIME: 88 minutes  DRAINS: 2 medium Hemovac drains  SOFT TISSUE RELEASES: Anterior cruciate ligament, posterior cruciate ligament, deep medial collateral ligament, patellofemoral ligament  IMPLANTS UTILIZED: DePuy Attune size 5N posterior stabilized femoral component (cemented), size 4 rotating platform tibial component (cemented), 35 mm medialized dome patella (cemented), and a 5 mm stabilized rotating platform polyethylene insert.  INDICATIONS FOR SURGERY: Rhonda Oconnell is a 73 y.o. year old female with a long history of progressive knee pain. X-rays demonstrated severe degenerative changes in tricompartmental fashion. The patient had not seen any significant improvement despite conservative nonsurgical intervention. After discussion of the risks and benefits of surgical intervention, the patient expressed understanding of the risks benefits and agree with plans for total knee arthroplasty.   The risks, benefits, and alternatives were discussed at length including but not limited to the risks of infection, bleeding, nerve injury, stiffness, blood clots, the need for revision surgery, cardiopulmonary complications, among others, and they were willing to proceed.  PROCEDURE IN DETAIL: The patient was brought into the operating room and, after adequate spinal anesthesia was achieved, a tourniquet was placed  on the patient's upper thigh. The patient's knee and leg were cleaned and prepped with alcohol and DuraPrep and draped in the usual sterile fashion. A "timeout" was performed as per usual protocol. The lower extremity was exsanguinated using an Esmarch, and the tourniquet was inflated to 300 mmHg. An anterior longitudinal incision was made followed by a standard mid vastus approach. The deep fibers of the medial collateral ligament were elevated in a subperiosteal fashion off of the medial flare of the tibia so as to maintain a continuous soft tissue sleeve. The patella was subluxed laterally and the patellofemoral ligament was incised. Inspection of the knee demonstrated severe degenerative changes with full-thickness loss of articular cartilage. Osteophytes were debrided using a rongeur. Anterior and posterior cruciate ligaments were excised. Two 4.0 mm Schanz pins were inserted in the femur and into the tibia for attachment of the array of trackers used for computer-assisted navigation. Hip center was identified using a circumduction technique. Distal landmarks were mapped using the computer. The distal femur and proximal tibia were mapped using the computer. The distal femoral cutting guide was positioned using computer-assisted navigation so as to achieve a 5 distal valgus cut. The femur was sized and it was felt that a size 5N femoral component was appropriate. A size 5 femoral cutting guide was positioned and the anterior cut was performed and verified using the computer. This was followed by completion of the posterior and chamfer cuts. Femoral cutting guide for the central box was then positioned in the center box cut was performed.  Attention was then directed to the proximal tibia. Medial and lateral menisci were excised. The extramedullary tibial cutting guide was positioned using computer-assisted navigation so as to achieve a 0 varus-valgus alignment and 3 posterior slope. The cut was performed  and verified using the computer. The proximal tibia  was sized and it was felt that a size 4 tibial tray was appropriate. Tibial and femoral trials were inserted followed by insertion of a 5 mm polyethylene insert. This allowed for excellent mediolateral soft tissue balancing both in flexion and in full extension. Finally, the patella was cut and prepared so as to accommodate a 35 mm medialized dome patella. A patella trial was placed and the knee was placed through a range of motion with excellent patellar tracking appreciated. The femoral trial was removed after debridement of posterior osteophytes. The central post-hole for the tibial component was reamed followed by insertion of a keel punch. Tibial trials were then removed. Cut surfaces of bone were irrigated with copious amounts of normal saline using pulsatile lavage and then suctioned dry. Polymethylmethacrylate cement with gentamicin was prepared in the usual fashion using a vacuum mixer. Cement was applied to the cut surface of the proximal tibia as well as along the undersurface of a size 4 rotating platform tibial component. Tibial component was positioned and impacted into place. Excess cement was removed using Personal assistant. Cement was then applied to the cut surfaces of the femur as well as along the posterior flanges of the size 5N femoral component. The femoral component was positioned and impacted into place. Excess cement was removed using Personal assistant. A 5 mm polyethylene trial was inserted and the knee was brought into full extension with steady axial compression applied. Finally, cement was applied to the backside of a 35 mm medialized dome patella and the patellar component was positioned and patellar clamp applied. Excess cement was removed using Personal assistant. After adequate curing of the cement, the tourniquet was deflated after a total tourniquet time of 88 minutes. Hemostasis was achieved using electrocautery. The knee was irrigated  with copious amounts of normal saline using pulsatile lavage followed by 450 ml of Surgiphor and then suctioned dry. 20 mL of 1.3% Exparel and 60 mL of 0.25% Marcaine in 40 mL of normal saline was injected along the posterior capsule, medial and lateral gutters, and along the arthrotomy site. A 5 mm stabilized rotating platform polyethylene insert was inserted and the knee was placed through a range of motion with excellent mediolateral soft tissue balancing appreciated and excellent patellar tracking noted. 2 medium drains were placed in the wound bed and brought out through separate stab incisions. The medial parapatellar portion of the incision was reapproximated using interrupted sutures of #1 Vicryl. Subcutaneous tissue was approximated in layers using first #0 Vicryl followed #2-0 Vicryl. The skin was approximated with skin staples. A sterile dressing was applied.  The patient tolerated the procedure well and was transported to the recovery room in stable condition.    Loisann Roach P. Angie Fava., M.D.

## 2023-12-31 NOTE — Progress Notes (Signed)
Patient is not able to walk the distance required to go the bathroom, or he/she is unable to safely negotiate stairs required to access the bathroom.  A 3in1 BSC will alleviate this problem   Liliann File P. Brighton Pilley, Jr. M.D.  

## 2023-12-31 NOTE — H&P (Signed)
ORTHOPAEDIC HISTORY & PHYSICAL Raenette Rover, [place] - 12/25/2023 8:30 AM EST Formatting of this note is different from the original. NAME: Rhonda Oconnell H&P Date: 12/24/2022 Procedure Date: 12/31/2023  Chief Complaint: left knee pain  HPI Rhonda Oconnell is a 73 y.o. female who has severe left knee pain. She has had symptoms that have persisted for the last 2 years, but have recently gotten worse in the past few months. She states that most the pain localizes along the medial aspect of her knee. She does report intermittent swelling and some giving way of the knee, denies any locking. She states that this pain is aggravated with most weightbearing or prolonged standing. It has begun to affect her ability to perform her ADLs and ambulate long distances. She has failed conservative treatment including Tylenol, NSAIDs, intra-articular corticosteroid injections, viscosupplementation, and activity modification. She does not currently utilize any ambulatory aids . She has requested operative intervention for relief of her DJD symptoms. She has been following up with Dr. Ernest Pine to get her BMI down and has met her goal. She reports a history of COPD which she sees Dr. Dr. Jayme Cloud for and has been given approval for the surgery. She is also been told that she has CHF with a preserved ejection fraction. She is also noted to be a prediabetic, her last A1c was at 5.8. She denies any history of DVTs or clots.  Social Hx: Patient is retired and lives at home with her husband and has her kids in the area to help postsurgically. She denies any alcohol, drug use or smoking/nicotine use.  Medications & Allergies Allergies: Allergies Allergen Reactions Iodinated Contrast Media Hives Iodine Hives Sulfa (Sulfonamide Antibiotics) Hives Amlodipine Swelling Meloxicam Palpitations  Home Medicines: Current Outpatient Medications on File Prior to Visit Medication Sig Dispense Refill acetaminophen (TYLENOL) 500 MG  tablet Take 500 mg by mouth every 6 (six) hours as needed for Pain. acetaminophen/diphenhydramine (ACETAMINOPHEN PM ORAL) Take 1 tablet by mouth at bedtime as needed albuterol 90 mcg/actuation inhaler Inhale 2 inhalations into the lungs every 6 (six) hours as needed for Wheezing or Shortness of Breath 3 Inhaler 3 atorvastatin (LIPITOR) 20 MG tablet take 1 tablet by mouth every day 90 tablet 3 budesonide-glycopyrrolate-formoterol (BREZTRI AEROSPHERE) 160-9-4.8 mcg/actuation inhaler Inhale 2 inhalations into the lungs 2 (two) times daily 5.9 g 12 calcium carbonate-vitamin D3 (OS-CAL 500+D) 500 mg(1,250mg ) -200 unit tablet Take 1 tablet by mouth 2 (two) times daily with meals. cetirizine (ZYRTEC) 10 MG tablet Take 10 mg by mouth once daily famotidine (PEPCID) 40 MG tablet take 1 tablet by mouth everyday at bedtime 90 tablet 1 FUROsemide (LASIX) 20 MG tablet Take 20 mg by mouth once daily as needed for Edema hydroCHLOROthiazide (HYDRODIURIL) 25 MG tablet Take 25 mg by mouth once daily losartan (COZAAR) 100 MG tablet TAKE 1 TABLET BY MOUTH EVERY DAY 90 tablet 3 montelukast (SINGULAIR) 10 mg tablet take 1 tablet by mouth every day at night 90 tablet 3 multivitamin tablet Take 1 tablet by mouth once daily. omeprazole (PRILOSEC) 40 MG DR capsule Take 1 capsule (40 mg total) by mouth once daily  No current facility-administered medications on file prior to visit.  Medical / Surgical History  Past Medical History: Diagnosis Date Abdominal hernia Allergic state Anemia Arthritis Asthma without status asthmaticus (HHS-HCC) Cataract cortical, senile Chronic heart failure with preserved ejection fraction (HFpEF) (CMS/HHS-HCC) 04/29/2020 COPD (chronic obstructive pulmonary disease) (CMS/HHS-HCC) Dysphagia 05/17/2016 GERD (gastroesophageal reflux disease) Hemorrhoids Hyperlipidemia Hypertension Morbid obesity (CMS/HHS-HCC) 01/02/2019 Osteoporosis,  postmenopausal 05/14/2019 Prediabetes  06/26/2022 Rectal tear Rectocele Sleep apnea Ulcer Varicose veins of both lower extremities with pain 09/24/2016 See below   Past Surgical History: Procedure Laterality Date COLONOSCOPY 05/19/2006 Int Hemorrhoids, Diverticulosis: CBF 05/2016; Recall Ltr mailed 03/14/2016 (dw); OV made 05/17/2016 @ 11am w/Kim Arvilla Market NP (dw) EGD 05/19/2006 06/06/1995 COLONOSCOPY 07/22/2016 Int Hemorrhoids, Diverticulosis: CBF 07/2026 EGD 07/22/2016 No repeat per RTE ARTHROSCOPIC ROTATOR CUFF REPAIR pt has metal clamp in shoulder Bladder tack CATARACT EXTRACTION CHOLECYSTECTOMY hemorrhoid surgery HERNIA REPAIR HYSTERECTOMY ITP TUBAL LIGATION UNLISTED LAPAROSCOPY PROCEDURE SPLEEN   Physical Exam  Ht:157.5 cm (5\' 2" ) Wt:98.3 kg (216 lb 12.8 oz) BMI: Body mass index is 39.65 kg/m.  General/Constitutional: No apparent distress: well-nourished and well developed. Eyes: Pupils equal, round with synchronous movement. Lymphatic: No palpable adenopathy. Respiratory: Patient has good chest rise and fall with inspiration and expiration. All lung fields are clear to auscultation bilaterally. There is no Rales, rhonchi or wheezes appreciated. Noted to have some decreased airflow at the bases of bilateral lungs. Cardiovascular: Upon auscultation there is a regular rate and rhythm without any murmurs, rubs, gallops or heaves appreciated. There does not appear to be any swelling down the lower extremities. Posterior tibial pulses appreciated bilaterally, 2+. Integumentary: No impressive skin lesions present, except as noted in detailed exam. Neuro/Psych: Normal mood and affect, oriented to person, place and time. Musculoskeletal: see exam below  Left knee exam Upon inspection of the patient's left knee there does not appear to be any skin changes, open abrasions, swelling or redness. There is a varusalignment. Upon palpation, the patient reports having pain along the medial aspect of their knee. Patient has  about 5 degrees off of full extension actively with ROM, and able to flex back to 122 degrees with mild pain. Varus and valgus stress testing shows positive pseudolaxity to valgus stressing. The patella tracks well within the femoral groove from flexion into extension with mild crepitus appreciated. Anterior and posterior drawer testing negative. Patient is neurovascularly intact down their lower extremity to all dermatomes. Posterior tibial pulses appreciated 2+.  Imaging Left knee Imaging: A series of x-rays were ordered and interpreted of the patient's left knee. These images included AP, lateral and sunrise views. Upon inspection of the AP view there is notable significant narrowing of the medial cartilage space. There are subchondral changes appreciated as well as small osteophytes off of the medial tibial plateau. There is an overall varus alignment. No fractures, lytic lesions or gross deformities appreciated on films.  Assesment and Plan Knee DJD  I have recommended that Rhonda Oconnell undergo left total knee replacement. Consents has been signed. The risks, benefits, prognosis and alternatives including but not limited to DVT, PE, infection, neurovascular injury, failure of the procedure and death were explained to the patient and she is willing to proceed with surgery as described to her by myself. Plan will be for post operative admission of at least 1 midnight for pain control and PT. She will be managed with DVT prophylaxis, antibiotics preoperatively for 24 hours and aggressive in patient rehab.  Pre, intra and post op interventions were discussed. Patient has good understanding  Medication Reconciliation was performed. Discussed cessation of NSAIDs, vitamins and supplements.  A total of 45 minutes was spent reviewing patient's charts, medical reconciliation, discussing/educating the patient about surgical interventions, and answering any questions provided by the patient.  JOSHUA Kendrick Fries, PA [facility] 12/25/2023  Electronically signed by Raenette Rover, PA at 12/29/2023 5:29 PM  EST

## 2023-12-31 NOTE — Evaluation (Signed)
Physical Therapy Evaluation Patient Details Name: Rhonda Oconnell MRN: [id number] DOB: [birth year] Today's Date: 12/31/2023  History of Present Illness  Pt is a43 yo female s/p L TKA. PMH of  sleep apnea, COPD, former smoker, dysphagia, HLD.  Clinical Impression  Pt alert, oriented x4, did report 9/10 pain in L knee but wanting to try to move to see if it would help. Pt reported some decrease in pain at end of mobility. Pt stated at baseline she is independent. She was able to perform some supine therapeutic exercises with verbal and tactile cues. Supine <> sit minA, and able to sit <> stand with CGA (when obtained a walker). She ambulated a few feet forwards/backwards CGA as well. Returned to bed with needs in reach, RN at bedside.  Overall the patient demonstrated deficits (see "PT Problem List") that impede the patient's functional abilities, safety, and mobility and would benefit from skilled PT intervention.          If plan is discharge home, recommend the following: Assistance with cooking/housework;Assist for transportation;Help with stairs or ramp for entrance   Can travel by private vehicle        Equipment Recommendations Rolling walker (2 wheels);BSC/3in1  Recommendations for Other Services       Functional Status Assessment Patient has had a recent decline in their functional status and demonstrates the ability to make significant improvements in function in a reasonable and predictable amount of time.     Precautions / Restrictions Precautions Precautions: Fall;Knee Precaution Booklet Issued: Yes (comment) Restrictions Weight Bearing Restrictions Per Provider Order: Yes LLE Weight Bearing Per Provider Order: Weight bearing as tolerated      Mobility  Bed Mobility Overal bed mobility: Needs Assistance Bed Mobility: Supine to Sit, Sit to Supine     Supine to sit: Min assist, Used rails, HOB elevated Sit to supine: Min assist   General bed mobility comments: minA  for LLE assist, extra time    Transfers Overall transfer level: Needs assistance Equipment used: Rolling walker (2 wheels), 2 person hand held assist Transfers: Sit to/from Stand Sit to Stand: Min assist, Contact guard assist           General transfer comment: minAx2 to stand without RW, CGA to sit with RW    Ambulation/Gait   Gait Distance (Feet): 2 Feet Assistive device: Rolling walker (2 wheels)         General Gait Details: able to take a few steps forwards/backwards  Stairs            Wheelchair Mobility     Tilt Bed    Modified Rankin (Stroke Patients Only)       Balance Overall balance assessment: Needs assistance Sitting-balance support: Feet supported Sitting balance-Leahy Scale: Fair     Standing balance support: Bilateral upper extremity supported Standing balance-Leahy Scale: Fair                               Pertinent Vitals/Pain Pain Assessment Pain Assessment: 0-10 Pain Score: 9  Pain Location: L knee Pain Descriptors / Indicators: Sore, Aching, Tender Pain Intervention(s): Monitored during session, Limited activity within patient's tolerance, Repositioned, Premedicated before session    Home Living Family/patient expects to be discharged to:: Private residence Living Arrangements: Spouse/significant other Available Help at Discharge: Family Type of Home: House Home Access: Stairs to enter   [employer] of Steps: threshold   Home Layout: One level Home  Equipment: Grab bars - toilet      Prior Function Prior Level of Function : Independent/Modified Independent;Driving                     Extremity/Trunk Assessment   Upper Extremity Assessment Upper Extremity Assessment: Overall WFL for tasks assessed    Lower Extremity Assessment Lower Extremity Assessment:  (LLE s/p L TKA, RLE WFLs)       Communication      Cognition Arousal: Alert Behavior During Therapy: WFL for tasks  assessed/performed Overall Cognitive Status: Within Functional Limits for tasks assessed                                 General Comments: sleepy, but followed all commands well        General Comments      Exercises Total Joint Exercises Ankle Circles/Pumps: AROM, Both, 10 reps Quad Sets: AROM, Left, 10 reps, Strengthening Heel Slides: AAROM, Left, 10 reps Hip ABduction/ADduction: AROM, Strengthening, Left, 10 reps   Assessment/Plan    PT Assessment Patient needs continued PT services  PT Problem List Decreased strength;Pain;Decreased range of motion;Decreased activity tolerance;Decreased balance;Decreased mobility;Decreased knowledge of precautions;Decreased knowledge of use of DME       PT Treatment Interventions DME instruction;Balance training;Gait training;Neuromuscular re-education;Stair training;Functional mobility training;Patient/family education;Therapeutic activities;Therapeutic exercise    PT Goals (Current goals can be found in the Care Plan section)  Acute Rehab PT Goals Patient Stated Goal: to go home PT Goal Formulation: With patient Time For Goal Achievement: 01/14/24 Potential to Achieve Goals: Good    Frequency BID     Co-evaluation               AM-PAC PT "6 Clicks" Mobility  Outcome Measure Help needed turning from your back to your side while in a flat bed without using bedrails?: A Little Help needed moving from lying on your back to sitting on the side of a flat bed without using bedrails?: A Little Help needed moving to and from a bed to a chair (including a wheelchair)?: None Help needed standing up from a chair using your arms (e.g., wheelchair or bedside chair)?: None Help needed to walk in hospital room?: None Help needed climbing 3-5 steps with a railing? : A Little 6 Click Score: 21    End of Session   Activity Tolerance: Patient tolerated treatment well Patient left: in bed;with nursing/sitter in room Nurse  Communication: Mobility status PT Visit Diagnosis: Difficulty in walking, not elsewhere classified (R26.2);Other abnormalities of gait and mobility (R26.89);Muscle weakness (generalized) (M62.81);Pain Pain - Right/Left: Left Pain - part of body: Knee    Time: 7829-5621 PT Time Calculation (min) (ACUTE ONLY): 12 min   Charges:   PT Evaluation $PT Eval Low Complexity: 1 Low PT Treatments $Therapeutic Activity: 8-22 mins PT General Charges $$ ACUTE PT VISIT: 1 Visit         Olga Coaster PT, DPT 4:32 PM,12/31/23

## 2023-12-31 NOTE — Transfer of Care (Signed)
Immediate Anesthesia Transfer of Care Note  Patient: Rhonda Oconnell  Procedure(s) Performed: COMPUTER ASSISTED TOTAL KNEE ARTHROPLASTY (Left: Knee)  Patient Location: PACU  Anesthesia Type:Spinal  Level of Consciousness: awake, alert , and drowsy  Airway & Oxygen Therapy: Patient Spontanous Breathing and Patient connected to face mask oxygen  Post-op Assessment: Report given to RN and Post -op Vital signs reviewed and stable  Post vital signs: Reviewed and stable  Last Vitals:  Vitals Value Taken Time  BP 114/72 12/31/23 1534  Temp 36.7 C 12/31/23 1534  Pulse 92 12/31/23 1540  Resp 18 12/31/23 1540  SpO2 97 % 12/31/23 1540  Vitals shown include unfiled device data.  Last Pain:  Vitals:   12/31/23 1534  TempSrc:   PainSc: Asleep         Complications: No notable events documented.

## 2024-01-01 ENCOUNTER — Encounter: Payer: Self-pay | Admitting: Orthopedic Surgery

## 2024-01-01 DIAGNOSIS — M1712 Unilateral primary osteoarthritis, left knee: Secondary | ICD-10-CM | POA: Diagnosis not present

## 2024-01-01 MED ORDER — OXYCODONE HCL 5 MG PO TABS
ORAL_TABLET | ORAL | Status: AC
  Filled 2024-01-01: qty 1

## 2024-01-01 MED ORDER — HYDROCHLOROTHIAZIDE 25 MG PO TABS
ORAL_TABLET | ORAL | Status: AC
Start: 2024-01-01 — End: ?
  Filled 2024-01-01: qty 1

## 2024-01-01 MED ORDER — CELECOXIB 200 MG PO CAPS: 200.0000 mg | ORAL_CAPSULE | Freq: Two times a day (BID) | ORAL | 1 refills | Status: DC

## 2024-01-01 MED ORDER — SENNOSIDES-DOCUSATE SODIUM 8.6-50 MG PO TABS
ORAL_TABLET | ORAL | Status: AC
  Filled 2024-01-01: qty 1

## 2024-01-01 MED ORDER — FERROUS SULFATE 325 (65 FE) MG PO TABS
ORAL_TABLET | ORAL | Status: AC
  Filled 2024-01-01: qty 1

## 2024-01-01 MED ORDER — ACETAMINOPHEN 10 MG/ML IV SOLN
INTRAVENOUS | Status: AC
  Filled 2024-01-01: qty 100

## 2024-01-01 MED ORDER — ASPIRIN 81 MG PO CHEW
CHEWABLE_TABLET | ORAL | Status: AC
  Filled 2024-01-01: qty 1

## 2024-01-01 MED ORDER — OXYCODONE HCL 5 MG PO TABS: 5.0000 mg | ORAL_TABLET | ORAL | 0 refills | Status: DC | PRN

## 2024-01-01 MED ORDER — LORATADINE 10 MG PO TABS
ORAL_TABLET | ORAL | Status: AC
  Filled 2024-01-01: qty 1

## 2024-01-01 MED ORDER — CELECOXIB 200 MG PO CAPS
ORAL_CAPSULE | ORAL | Status: AC
  Filled 2024-01-01: qty 1

## 2024-01-01 MED ORDER — METOCLOPRAMIDE HCL 10 MG PO TABS
ORAL_TABLET | ORAL | Status: AC
Start: 2024-01-01 — End: ?
  Filled 2024-01-01: qty 1

## 2024-01-01 MED ORDER — TRAMADOL HCL 50 MG PO TABS: 50.0000 mg | ORAL_TABLET | ORAL | 0 refills | Status: DC | PRN

## 2024-01-01 MED ORDER — MAGNESIUM HYDROXIDE 400 MG/5ML PO SUSP
ORAL | Status: AC
  Filled 2024-01-01: qty 30

## 2024-01-01 MED ORDER — CEFAZOLIN SODIUM-DEXTROSE 2-4 GM/100ML-% IV SOLN
INTRAVENOUS | Status: AC
  Filled 2024-01-01: qty 100

## 2024-01-01 MED ORDER — ASPIRIN 81 MG PO CHEW: 81.0000 mg | CHEWABLE_TABLET | Freq: Two times a day (BID) | ORAL | Status: AC

## 2024-01-01 MED ORDER — PANTOPRAZOLE SODIUM 40 MG PO TBEC
DELAYED_RELEASE_TABLET | ORAL | Status: AC
  Filled 2024-01-01: qty 1

## 2024-01-01 NOTE — Evaluation (Signed)
Occupational Therapy Evaluation Patient Details Name: Rhonda Oconnell MRN: [id number] DOB: 10/27/1951 Today's Date: 01/01/2024   History of Present Illness Pt is a21 yo female s/p L TKA. PMH of  sleep apnea, COPD, former smoker, dysphagia, HLD.   Clinical Impression   Pt seen for OT evaluation this date, POD#1 from above surgery. PTA pt is MOD I-I In ADL/IADL; Pt instructed in polar care mgt, falls prevention strategies, home/routines modifications, DME/AE for LB bathing and dressing tasks, and compression stocking mgt via demo and hand out. LB dressing completed with MIN A, demoed use of AE for improved ADL performance. ADL mobility completed with RW with supervision, toilet transfer and toileting with supervision. Pt would benefit from acute skilled OT services including additional instruction in dressing techniques with or without assistive devices for dressing and bathing skills to support recall and carryover prior to discharge and ultimately to maximize safety, independence, and minimize falls risk and caregiver burden. Do not currently anticipate any OT needs following this hospitalization.  OT will follow acutely.       If plan is discharge home, recommend the following: Help with stairs or ramp for entrance;Assistance with cooking/housework;A little help with bathing/dressing/bathroom    Functional Status Assessment  Patient has had a recent decline in their functional status and demonstrates the ability to make significant improvements in function in a reasonable and predictable amount of time.  Equipment Recommendations  Other (comment) (shower chair as needed after she is cleared to shower (pt will acquire later if needed ))    Recommendations for Other Services       Precautions / Restrictions Precautions Precautions: Fall;Knee Precaution Booklet Issued: Yes (comment) Restrictions Weight Bearing Restrictions Per Provider Order: Yes LLE Weight Bearing Per Provider Order:  Weight bearing as tolerated      Mobility Bed Mobility               General bed mobility comments: NT in recliner pre/post session    Transfers Overall transfer level: Needs assistance Equipment used: Rolling walker (2 wheels) Transfers: Sit to/from Stand Sit to Stand: Supervision                  Balance Overall balance assessment: Needs assistance Sitting-balance support: Feet supported Sitting balance-Leahy Scale: Good     Standing balance support: Bilateral upper extremity supported, During functional activity, Reliant on assistive device for balance Standing balance-Leahy Scale: Good                             ADL either performed or assessed with clinical judgement   ADL Overall ADL's : Needs assistance/impaired Eating/Feeding: Set up;Sitting   Grooming: Standing;Supervision/safety;Wash/dry hands Grooming Details (indicate cue type and reason): with RW at sink level         Upper Body Dressing : Set up;Sitting   Lower Body Dressing: Minimal assistance;Sit to/from stand Lower Body Dressing Details (indicate cue type and reason): underwear and pants, discussed use of AE; pt reports husband can also help her as needed Toilet Transfer: Supervision/safety;Rolling walker (2 wheels);Ambulation;Grab bars;Regular Toilet   Toileting- Clothing Manipulation and Hygiene: Supervision/safety;Sitting/lateral lean       Functional mobility during ADLs: Supervision/safety;Rolling walker (2 wheels) (approx 15' in room two attempts)       Vision Patient Visual Report: No change from baseline       Perception         Praxis  Pertinent Vitals/Pain Pain Assessment Pain Assessment: 0-10 Pain Score: 3  Pain Location: L knee Pain Descriptors / Indicators: Sore, Aching, Tender Pain Intervention(s): Monitored during session, Limited activity within patient's tolerance, Repositioned, Premedicated before session     Extremity/Trunk  Assessment Upper Extremity Assessment Upper Extremity Assessment: Overall WFL for tasks assessed   Lower Extremity Assessment Lower Extremity Assessment: LLE deficits/detail LLE Deficits / Details: s/p L TKA   Cervical / Trunk Assessment Cervical / Trunk Assessment: Normal   Communication Communication Communication: No apparent difficulties Cueing Techniques: Verbal cues   Cognition Arousal: Alert Behavior During Therapy: WFL for tasks assessed/performed Overall Cognitive Status: Within Functional Limits for tasks assessed                                       General Comments  vss throughout    Exercises     Shoulder Instructions      Home Living Family/patient expects to be discharged to:: Private residence Living Arrangements: Spouse/significant other Available Help at Discharge: Family Type of Home: House Home Access: Stairs to enter Secretary/administrator of Steps: threshold   Home Layout: One level         Bathroom Toilet: Handicapped height     Home Equipment: Grab bars - toilet          Prior Functioning/Environment Prior Level of Function : Independent/Modified Independent;Driving                        OT Problem List: Decreased activity tolerance;Impaired balance (sitting and/or standing)      OT Treatment/Interventions: Self-care/ADL training;DME and/or AE instruction;Therapeutic activities;Balance training;Therapeutic exercise;Energy conservation;Patient/family education    OT Goals(Current goals can be found in the care plan section) Acute Rehab OT Goals Patient Stated Goal: go home OT Goal Formulation: With patient Time For Goal Achievement: 01/15/24 Potential to Achieve Goals: Good ADL Goals Pt Will Perform Grooming: with modified independence;sitting Pt Will Perform Lower Body Dressing: with modified independence;sit to/from stand;sitting/lateral leans Pt Will Transfer to Toilet: with modified  independence;ambulating Pt Will Perform Toileting - Clothing Manipulation and hygiene: with modified independence;sit to/from stand;sitting/lateral leans  OT Frequency: Min 1X/week    Co-evaluation              AM-PAC OT "6 Clicks" Daily Activity     Outcome Measure Help from another person eating meals?: None Help from another person taking care of personal grooming?: None Help from another person toileting, which includes using toliet, bedpan, or urinal?: None Help from another person bathing (including washing, rinsing, drying)?: A Little Help from another person to put on and taking off regular upper body clothing?: None Help from another person to put on and taking off regular lower body clothing?: A Little 6 Click Score: 22   End of Session Equipment Utilized During Treatment: Rolling walker (2 wheels) Nurse Communication: Mobility status  Activity Tolerance: Patient tolerated treatment well Patient left: in chair;with call bell/phone within reach  OT Visit Diagnosis: Other abnormalities of gait and mobility (R26.89)                Time: 1610-9604 OT Time Calculation (min): 17 min Charges:  OT General Charges $OT Visit: 1 Visit OT Evaluation $OT Eval Low Complexity: 1 Low  Oleta Mouse, OTD OTR/L  01/01/24, 10:35 AM

## 2024-01-01 NOTE — Anesthesia Postprocedure Evaluation (Signed)
Anesthesia Post Note  Patient: Rhonda Oconnell  Procedure(s) Performed: COMPUTER ASSISTED TOTAL KNEE ARTHROPLASTY (Left: Knee)  Patient location during evaluation: Nursing Unit Anesthesia Type: Spinal Level of consciousness: oriented and awake and alert Pain management: pain level controlled Vital Signs Assessment: post-procedure vital signs reviewed and stable Respiratory status: spontaneous breathing and respiratory function stable Cardiovascular status: blood pressure returned to baseline and stable Postop Assessment: no headache, no backache, no apparent nausea or vomiting and patient able to bend at knees Anesthetic complications: no  No notable events documented.   Last Vitals:  Vitals:   01/01/24 0127 01/01/24 0400  BP: 124/72 (!) 107/46  Pulse: 82 70  Resp: 16 18  Temp: 36.6 C 36.8 C  SpO2: 92% 95%    Last Pain:  Vitals:   01/01/24 0550  TempSrc:   PainSc: Asleep                 [name]

## 2024-01-01 NOTE — Progress Notes (Addendum)
Physical Therapy Treatment Patient Details Name: KILEEN GETCHELL MRN: [id number] DOB: Mar 12, 1951 Today's Date: 01/01/2024   History of Present Illness Pt is a42 yo female s/p L TKA. PMH of  sleep apnea, COPD, former smoker, dysphagia, HLD.    PT Comments  Pt demonstrated safe abilities throughout session and is cleared from an acute PT standpoint for safe DC home. HHPT already arranged and scheduled to start tomorrow.  Pt is awaiting DME prior to going home.   If plan is discharge home, recommend the following: Assistance with cooking/housework;Assist for transportation;Help with stairs or ramp for entrance     Equipment Recommendations  Rolling walker (2 wheels)       Precautions / Restrictions Precautions Precautions: Fall;Knee Precaution Booklet Issued: Yes (comment) Restrictions Weight Bearing Restrictions Per Provider Order: Yes LLE Weight Bearing Per Provider Order: Weight bearing as tolerated     Mobility  Bed Mobility Overal bed mobility: Modified Independent     Transfers Overall transfer level: Needs assistance Equipment used: Rolling walker (2 wheels) Transfers: Sit to/from Stand Sit to Stand: Supervision   Ambulation/Gait Ambulation/Gait assistance: Supervision Gait Distance (Feet): 160 Feet Assistive device: Rolling walker (2 wheels) Gait Pattern/deviations: Step-to pattern, Antalgic Gait velocity: decreased  General Gait Details: pt ambulated ~ 160 ft with RW. no LOB or unsteadiness. slow cautious but safe gait sequencing   Stairs Stairs: Yes Stairs assistance: Contact guard assist Stair Management: No rails, Step to pattern, With walker Number of Stairs: 1 General stair comments: pt performed ascending/descending 1 step to simulate home entry. no difficulty noted. pt states confidenece in safe abilities to get into/out of her home.   Balance Overall balance assessment: Needs assistance Sitting-balance support: Feet supported Sitting balance-Leahy  Scale: Good     Standing balance support: Bilateral upper extremity supported, During functional activity, Reliant on assistive device for balance Standing balance-Leahy Scale: Good       Cognition Arousal: Alert Behavior During Therapy: WFL for tasks assessed/performed Overall Cognitive Status: Within Functional Limits for tasks assessed          General Comments General comments (skin integrity, edema, etc.): vss throughout      Pertinent Vitals/Pain Pain Assessment Pain Assessment: 0-10 Pain Score: 3  Pain Location: L knee Pain Descriptors / Indicators: Sore, Aching, Tender Pain Intervention(s): Limited activity within patient's tolerance, Monitored during session, Premedicated before session, Repositioned, Ice applied    Home Living Family/patient expects to be discharged to:: Private residence Living Arrangements: Spouse/significant other Available Help at Discharge: Family Type of Home: House Home Access: Stairs to enter   Secretary/administrator of Steps: threshold   Home Layout: One level Home Equipment: Grab bars - toilet          PT Goals (current goals can now be found in the care plan section) Acute Rehab PT Goals Patient Stated Goal: to go home Progress towards PT goals: Progressing toward goals    Frequency    BID       AM-PAC PT "6 Clicks" Mobility   Outcome Measure  Help needed turning from your back to your side while in a flat bed without using bedrails?: A Little Help needed moving from lying on your back to sitting on the side of a flat bed without using bedrails?: A Little Help needed moving to and from a bed to a chair (including a wheelchair)?: A Little Help needed standing up from a chair using your arms (e.g., wheelchair or bedside chair)?: A Little Help needed to walk  in hospital room?: A Little Help needed climbing 3-5 steps with a railing? : A Little 6 Click Score: 18    End of Session   Activity Tolerance: Patient  tolerated treatment well Patient left: in chair;with call bell/phone within reach;Other (comment) (OT comingto work with pt at conclusion of PT session) Nurse Communication: Mobility status PT Visit Diagnosis: Difficulty in walking, not elsewhere classified (R26.2);Other abnormalities of gait and mobility (R26.89);Muscle weakness (generalized) (M62.81);Pain Pain - part of body: Knee     Time: 1610-9604 PT Time Calculation (min) (ACUTE ONLY): 32 min  Charges:    $Gait Training: 8-22 mins $Therapeutic Activity: 8-22 mins PT General Charges $$ ACUTE PT VISIT: 1 Visit                     Jetta Lout PTA 01/01/24, 10:59 AM

## 2024-01-01 NOTE — Telephone Encounter (Signed)
Okay to fill x 1 but she is supposed to be evaluated by GI for this issue.

## 2024-01-01 NOTE — Plan of Care (Signed)
  Problem: Activity: Goal: Risk for activity intolerance will decrease Outcome: Progressing   Problem: Coping: Goal: Level of anxiety will decrease Outcome: Progressing   Problem: Pain Managment: Goal: General experience of comfort will improve and/or be controlled Outcome: Progressing   Problem: Activity: Goal: Ability to avoid complications of mobility impairment will improve Outcome: Progressing   Problem: Pain Management: Goal: Pain level will decrease with appropriate interventions Outcome: Progressing

## 2024-01-01 NOTE — Progress Notes (Addendum)
Subjective: 1 Day Post-Op Procedure(s) (LRB): COMPUTER ASSISTED TOTAL KNEE ARTHROPLASTY (Left) Patient reports pain as mild.   Patient seen in rounds with Dr. Ernest Pine. Patient is well, and has had no acute complaints or problems. Denies any CP, SOB, N/V, fevers or chills.  Did state that she had some difficulty with voiding her bladder right after surgery, and required to be straight cathed the night of surgery.  She does state this morning however that she has been able to more readily void her bladder on her own.  Will continue to monitor. We will start therapy today.  Plan is to go Home after hospital stay.  Objective: Vital signs in last 24 hours: Temp:  [97 F (36.1 C)-98.3 F (36.8 C)] 98.3 F (36.8 C) (01/23 0753) Pulse Rate:  [58-104] 71 (01/23 0753) Resp:  [9-21] 18 (01/23 0753) BP: (102-148)/(46-87) 114/55 (01/23 0753) SpO2:  [91 %-99 %] 97 % (01/23 0753)  Intake/Output from previous day:  Intake/Output Summary (Last 24 hours) at 01/01/2024 0829 Last data filed at 01/01/2024 0751 Gross per 24 hour  Intake 3098.77 ml  Output 465 ml  Net 2633.77 ml    Intake/Output this shift: Total I/O In: -  Out: 125 [Urine:125]  Labs: No results for input(s): "HGB" in the last 72 hours. No results for input(s): "WBC", "RBC", "HCT", "PLT" in the last 72 hours. No results for input(s): "NA", "K", "CL", "CO2", "BUN", "CREATININE", "GLUCOSE", "CALCIUM" in the last 72 hours. No results for input(s): "LABPT", "INR" in the last 72 hours.  EXAM General - Patient is Alert, Appropriate, and Oriented Extremity - Neurologically intact ABD soft Neurovascular intact Sensation intact distally Intact pulses distally Dorsiflexion/Plantar flexion intact No cellulitis present Compartment soft Dressing - dressing C/D/I and no drainage Motor Function - intact, moving foot and toes well on exam. Able to plantar and dorsiflex with good strength and ROM.  Neurovascular intact all dermatomes  extending down her left lower extremity.  Posterior tibial pulses appreciated JP Drain pulled without difficulty. Intact  Past Medical History:  Diagnosis Date   Abdominal hernia    Adenoma of right adrenal gland    Allergic state    Anemia    Aortic atherosclerosis (HCC)    Arthritis    Asthma-COPD overlap syndrome (HCC)    Chest pain    a. 04/2013 MV: Nl EF, no isch/infarct ; b. 05/2020 MV: EF 55-65%, no isch/infarct.   Chronic heart failure with preserved ejection fraction (HFpEF) (HCC)    a. 03/2020 Echo: EF 55-60%, no rwma, GrII DD, mild Ao sclerosis; b. 02/2022 Echo: EF 60-65%, no rwma, GrI DD, nl RV fxn, Ao sclerosis w/o stenosis.   Complication of anesthesia    a.) delayed emergence; b.) postopertive hypotension   Dysphagia    Dyspnea on exertion    Family history of adverse reaction to anesthesia    a.) PONV in first degree (mother) and second degree (maternal aunt) relatives   Former smoker    GERD (gastroesophageal reflux disease)    Heart murmur    Hemorrhoids    History of bilateral cataract extraction    History of ITP 1983   Hyperlipidemia    Hypertension    Laryngopharyngeal reflux (LPR)    Morbid obesity (HCC)    OSA on CPAP    PONV (postoperative nausea and vomiting)    Pre-diabetes    Pulmonary nodules    Rectal tear    Rectocele    Skin cancer    Varicose veins  of lower extremity     Assessment/Plan: 1 Day Post-Op Procedure(s) (LRB): COMPUTER ASSISTED TOTAL KNEE ARTHROPLASTY (Left) Principal Problem:   History of total knee arthroplasty, left  Estimated body mass index is 38.79 kg/m as calculated from the following:   Height as of 12/25/23: 5\' 3"  (1.6 m).   Weight as of 12/25/23: 99.3 kg. Advance diet Up with therapy  Patient will continue to work with physical therapy to pass postoperative PT protocols, ROM and strengthening  Discussed with the patient continuing to utilize Polar Care  Patient will use bone foam in 20-30 minute  intervals  Patient will wear TED hose bilaterally to help prevent DVT and clot formation  Discussed the Aquacel bandage.  This bandage will stay in place 7 days postoperatively.  Can be replaced with honeycomb bandages that will be sent home with the patient  Discussed sending the patient home with tramadol and oxycodone for as needed pain management.  Patient will also be sent home with Celebrex to help with swelling and inflammation.  Patient will take an 81 mg aspirin twice daily for DVT prophylaxis  JP drain removed without difficulty, intact  Weight-Bearing as tolerated to left leg  Patient will follow-up with [facility] orthopedics in 2 weeks for staple removal and reevaluation  Rayburn Go, PA-C [facility] Orthopaedics 01/01/2024, 8:29 AM

## 2024-01-01 NOTE — Plan of Care (Signed)
  Problem: Activity: Goal: Risk for activity intolerance will decrease Outcome: Progressing   Problem: Pain Managment: Goal: General experience of comfort will improve and/or be controlled Outcome: Progressing

## 2024-01-01 NOTE — TOC Initial Note (Signed)
Transition of Care [facility]) - Initial/Assessment Note    Patient Details  Name: Rhonda Oconnell MRN: [id number] Date of Birth: 21-Feb-1951  [facility]) CM/SW Contact:    Marlowe Sax, RN Phone Number: 01/01/2024, 9:48 AM  Clinical Narrative:          The patient lives at home with her spouse RW and 3 in1 to be delivered by [employer] to the bedside She is set up with [facility] prior to [facility] office          Expected Discharge Plan: Home w Home Health Services Barriers to Discharge: Barriers Resolved   Patient Goals and CMS Choice            Expected Discharge Plan and Services   Discharge Planning Services: CM Consult   Living arrangements for the past 2 months: Single Family Home Expected Discharge Date: 01/01/24               DME Arranged: Dan Humphreys rolling, 3-N-1 DME Agency: [facility] Date DME Agency Contacted: 01/01/24 Time DME Agency Contacted: [phone] Representative spoke with at DME Agency: Cletis [place] HH Arranged: PT, OT HH Agency: [facility] Date [facility] Agency Contacted: 01/01/24 Time HH Agency Contacted: [phone] Representative spoke with at [facility] Agency: [place]  Prior Living Arrangements/Services Living arrangements for the past 2 months: Single Family Home Lives with:: Spouse Patient language and need for interpreter reviewed:: Yes Do you feel safe going back to the place where you live?: Yes      Need for Family Participation in Patient Care: Yes (Comment) Care giver support system in place?: Yes (comment)   Criminal Activity/Legal Involvement Pertinent to Current Situation/Hospitalization: No - Comment as needed  Activities of Daily Living   ADL Screening (condition at time of admission) Independently performs ADLs?: Yes (appropriate for developmental age) Is the patient deaf or have difficulty hearing?: No Does the patient have difficulty seeing, even when wearing glasses/contacts?: No Does the patient have difficulty  concentrating, remembering, or making decisions?: No  Permission Sought/Granted   Permission granted to share information with : Yes, Verbal Permission Granted              Emotional Assessment Appearance:: Appears stated age Attitude/Demeanor/Rapport: Engaged Affect (typically observed): Pleasant Orientation: : Oriented to Self, Oriented to Place, Oriented to  Time, Oriented to Situation Alcohol / Substance Use: Not Applicable Psych Involvement: No (comment)  Admission diagnosis:  History of total knee arthroplasty, left [Y78.295] Patient Active Problem List   Diagnosis Date Noted   History of total knee arthroplasty, left 12/31/2023   Chronic cough 01/30/2022   Acute exacerbation of COPD with asthma (HCC) 01/16/2022   Acute rhinosinusitis 01/16/2022   Acute tracheobronchitis 01/16/2022   Dyspnea on exertion 04/29/2020   Chronic heart failure with preserved ejection fraction (HFpEF) (HCC) 04/29/2020   Laryngopharyngeal reflux (LPR) 01/04/2019   Acute recurrent sinusitis 01/04/2019   Morbid obesity (HCC) 01/02/2019   Morbid obesity with BMI of 40.0-44.9, adult (HCC) 11/24/2018   Obstructive lung disease (HCC) 09/24/2016   Varicose veins of both lower extremities with pain 09/24/2016   History of ITP 04/27/2015   Essential hypertension 04/14/2014   Other and unspecified hyperlipidemia 04/14/2014   Allergic rhinitis 04/14/2014   Esophageal reflux 04/14/2014   PCP:  Kandyce Rud, MD Pharmacy:   [facility] [phone] Nicholes Rough, [place] - [address] [place] [address] Phone: [phone] Fax: [phone]  [id number] [place], [place] - [address] [place] [address] Phone: [phone] Fax: [phone]     Social Drivers of Health (SDOH) Social History: SDOH Screenings   Food Insecurity: No Food Insecurity (12/31/2023)  Housing: Low Risk  (12/31/2023)  Transportation Needs: No Transportation Needs (12/31/2023)   Utilities: Not At Risk (12/31/2023)  Financial Resource Strain: Patient Declined (01/23/2023)   Received from [facility], [facility]  Social Connections: Socially Integrated (12/31/2023)  Tobacco Use: Medium Risk (12/31/2023)   SDOH Interventions:     Readmission Risk Interventions     No data to display

## 2024-01-01 NOTE — Discharge Summary (Signed)
Physician Discharge Summary  Subjective: 1 Day Post-Op Procedure(s) (LRB): COMPUTER ASSISTED TOTAL KNEE ARTHROPLASTY (Left) Rhonda Oconnell reports pain as mild.   Rhonda Oconnell seen in rounds with Dr. Ernest Pine. Rhonda Oconnell is well, and has had no acute complaints or problems.  Denies any CP, SOB, N/B, fevers or chills. Rhonda Oconnell is ready to go home  Physician Discharge Summary  Rhonda Oconnell ID: Rhonda Oconnell MRN: [id number] DOB/AGE: 1951-09-20 73 y.o.  Admit date: 12/31/2023 Discharge date: 01/01/2024  Admission Diagnoses:  Discharge Diagnoses:  Principal Problem:   History of total knee arthroplasty, left   Discharged Condition: Good  Hospital Course: Rhonda Oconnell presented to the hospital on 12/31/2023 for an elective left total knee arthroplasty performed by Dr. Ernest Pine. Rhonda Oconnell was given 1g of TXA and 2g of Ancef prior to the procedure. Rhonda Oconnell tolerated the procedure well without any complications. See procedural note below for details. Postoperatively, the Rhonda Oconnell did very well. Rhonda Oconnell was able to pass PT protocols on post-op day one without any issues. JP drain was removed without any difficulty and was intact. Rhonda Oconnell did have some difficulty with initially voiding her bladder after surgery, was straight catheterized the night after surgery, however was able to void her bladder without any difficulty the next morning on postop day 1. Physical exam was unremarkable. Rhonda Oconnell denies any SOB, CP, N/V, fevers or chills. Vital signs are stable. Rhonda Oconnell is stable to discharge home.  PROCEDURE:  Left total knee arthroplasty using computer-assisted navigation   SURGEON:  Jena Gauss. M.D.   ASSISTANT:  Gean Birchwood, PA-C (present and scrubbed throughout the case, critical for assistance with exposure, retraction, instrumentation, and closure)   ANESTHESIA: spinal   ESTIMATED BLOOD LOSS: 50 mL   FLUIDS REPLACED: 800 mL of crystalloid   TOURNIQUET TIME: 88 minutes   DRAINS: 2 medium Hemovac drains   SOFT TISSUE  RELEASES: Anterior cruciate ligament, posterior cruciate ligament, deep medial collateral ligament, patellofemoral ligament   IMPLANTS UTILIZED: DePuy Attune size 5N posterior stabilized femoral component (cemented), size 4 rotating platform tibial component (cemented), 35 mm medialized dome patella (cemented), and a 5 mm stabilized rotating platform polyethylene insert.  Treatments: None  Discharge Exam: Blood pressure (!) 114/55, pulse 71, temperature 98.3 F (36.8 C), temperature source Oral, resp. rate 18, SpO2 97%.   Disposition: Home   Allergies as of 01/01/2024       Reactions   Iodinated Contrast Media Hives   Iodine Hives   Sulfa Antibiotics Hives   Nsaids Cough   Aleve [naproxen] Cough   Amlodipine Swelling   Meloxicam Palpitations        Medication List     STOP taking these medications    ibuprofen 200 MG tablet Commonly known as: ADVIL       TAKE these medications    acetaminophen 500 MG tablet Commonly known as: TYLENOL Take 1,000 mg by mouth every 6 (six) hours as needed for moderate pain (pain score 4-6).   albuterol 108 (90 Base) MCG/ACT inhaler Commonly known as: VENTOLIN HFA INHALE 2 PUFFS INTO THE LUNGS EVERY 6 HOURS AS NEEDED FOR WHEEZING OR SHORTNESS OF BREATH   aspirin 81 MG chewable tablet Chew 1 tablet (81 mg total) by mouth 2 (two) times daily.   atorvastatin 20 MG tablet Commonly known as: LIPITOR Take 20 mg by mouth at bedtime.   azelastine 0.1 % nasal spray Commonly known as: ASTELIN Place 2 sprays into both nostrils 2 (two) times daily. Use in each nostril as directed What changed:  when to take this reasons to take this   Breztri Aerosphere 160-9-4.8 MCG/ACT Aero Generic drug: Budeson-Glycopyrrol-Formoterol Inhale 2 puffs into the lungs in the morning and at bedtime.   CAL MAG ZINC +D3 PO Take 1 tablet by mouth daily.   celecoxib 200 MG capsule Commonly known as: CELEBREX Take 1 capsule (200 mg total) by mouth 2  (two) times daily.   cetirizine 10 MG tablet Commonly known as: ZYRTEC Take 1 tablet (10 mg total) by mouth daily. What changed: when to take this   chlorpheniramine 4 MG tablet Commonly known as: CHLOR-TRIMETON Take 4 mg by mouth at bedtime.   COLLAGEN PLUS VITAMIN C PO Take 1 tablet by mouth daily.   diphenhydramine-acetaminophen 25-500 MG Tabs tablet Commonly known as: TYLENOL PM Take 2 tablets by mouth at bedtime.   famotidine 40 MG tablet Commonly known as: PEPCID Take 1 tablet by mouth at bedtime.   hydrochlorothiazide 25 MG tablet Commonly known as: HYDRODIURIL Take 1 tablet (25 mg) by mouth once daily What changed:  how much to take how to take this when to take this   losartan 100 MG tablet Commonly known as: COZAAR Take 100 mg by mouth at bedtime.   montelukast 10 MG tablet Commonly known as: SINGULAIR Take 10 mg by mouth at bedtime.   multivitamin tablet Take 1 tablet by mouth daily.   NON FORMULARY CPAP at bedtime.   omeprazole 40 MG capsule Commonly known as: PRILOSEC Take 1 capsule (40 mg total) by mouth daily. What changed: when to take this   oxyCODONE 5 MG immediate release tablet Commonly known as: Oxy IR/ROXICODONE Take 1 tablet (5 mg total) by mouth every 4 (four) hours as needed for moderate pain (pain score 4-6) (pain score 4-6).   Spacer/Aero-Holding Rudean Curt 1 each by Does not apply route daily.   traMADol 50 MG tablet Commonly known as: ULTRAM Take 1-2 tablets (50-100 mg total) by mouth every 4 (four) hours as needed for moderate pain (pain score 4-6).               Durable Medical Equipment  (From admission, onward)           Start     Ordered   12/31/23 1527  DME Walker rolling  Once       Question:  Rhonda Oconnell needs a walker to treat with the following condition  Answer:  Total knee replacement status   12/31/23 1526   12/31/23 1527  DME Bedside commode  Once       Comments: Rhonda Oconnell is not able to walk the  distance required to go the bathroom, or he/Rhonda Oconnell is unable to safely negotiate stairs required to access the bathroom.  A 3in1 BSC will alleviate this problem  Question:  Rhonda Oconnell needs a bedside commode to treat with the following condition  Answer:  Total knee replacement status   12/31/23 1526            Follow-up Information     Rayburn Go, PA-C Follow up on 01/14/2024.   Specialty: Orthopedic Surgery Why: at 3:30pm Contact information: [address] [place] [address] [phone]         Donato Heinz, MD Follow up on 02/12/2024.   Specialty: Orthopedic Surgery Why: at 3:00pm Contact information: [address] [facility] [place] [address] [phone]                 Signed: Gean Birchwood 01/01/2024, 8:33 AM  Objective: Vital signs in last 24 hours: Temp:  [97 F (36.1 C)-98.3 F (36.8 C)] 98.3 F (36.8 C) (01/23 0753) Pulse Rate:  [58-104] 71 (01/23 0753) Resp:  [9-21] 18 (01/23 0753) BP: (102-148)/(46-87) 114/55 (01/23 0753) SpO2:  [91 %-99 %] 97 % (01/23 0753)  Intake/Output from previous day:  Intake/Output Summary (Last 24 hours) at 01/01/2024 0833 Last data filed at 01/01/2024 0751 Gross per 24 hour  Intake 3098.77 ml  Output 465 ml  Net 2633.77 ml    Intake/Output this shift: Total I/O In: -  Out: 125 [Urine:125]  Labs: No results for input(s): "HGB" in the last 72 hours. No results for input(s): "WBC", "RBC", "HCT", "PLT" in the last 72 hours. No results for input(s): "NA", "K", "CL", "CO2", "BUN", "CREATININE", "GLUCOSE", "CALCIUM" in the last 72 hours. No results for input(s): "LABPT", "INR" in the last 72 hours.  EXAM: General - Rhonda Oconnell is Alert, Appropriate, and Oriented Extremity - Neurologically intact ABD soft Neurovascular intact Sensation intact distally Intact pulses distally Dorsiflexion/Plantar flexion intact No cellulitis present Compartment soft Dressing - dressing C/D/I  and no drainage Motor Function - intact, moving foot and toes well on exam. Able to plantar and dorsiflex with good strength and ROM.  Neurovascular intact all dermatomes extending down her left lower extremity.  Posterior tibial pulses appreciated JP Drain pulled without difficulty. Intact  Assessment/Plan: 1 Day Post-Op Procedure(s) (LRB): COMPUTER ASSISTED TOTAL KNEE ARTHROPLASTY (Left) Procedure(s) (LRB): COMPUTER ASSISTED TOTAL KNEE ARTHROPLASTY (Left) Past Medical History:  Diagnosis Date   Abdominal hernia    Adenoma of right adrenal gland    Allergic state    Anemia    Aortic atherosclerosis (HCC)    Arthritis    Asthma-COPD overlap syndrome (HCC)    Chest pain    a. 04/2013 MV: Nl EF, no isch/infarct ; b. 05/2020 MV: EF 55-65%, no isch/infarct.   Chronic heart failure with preserved ejection fraction (HFpEF) (HCC)    a. 03/2020 Echo: EF 55-60%, no rwma, GrII DD, mild Ao sclerosis; b. 02/2022 Echo: EF 60-65%, no rwma, GrI DD, nl RV fxn, Ao sclerosis w/o stenosis.   Complication of anesthesia    a.) delayed emergence; b.) postopertive hypotension   Dysphagia    Dyspnea on exertion    Family history of adverse reaction to anesthesia    a.) PONV in first degree (mother) and second degree (maternal aunt) relatives   Former smoker    GERD (gastroesophageal reflux disease)    Heart murmur    Hemorrhoids    History of bilateral cataract extraction    History of ITP 1983   Hyperlipidemia    Hypertension    Laryngopharyngeal reflux (LPR)    Morbid obesity (HCC)    OSA on CPAP    PONV (postoperative nausea and vomiting)    Pre-diabetes    Pulmonary nodules    Rectal tear    Rectocele    Skin cancer    Varicose veins of lower extremity    Principal Problem:   History of total knee arthroplasty, left  Estimated body mass index is 38.79 kg/m as calculated from the following:   Height as of 12/25/23: 5\' 3"  (1.6 m).   Weight as of 12/25/23: 99.3 kg.  Rhonda Oconnell will continue to  work with home health physical therapy on gait, strength and ROM with his left lower extremity.   Discussed with the Rhonda Oconnell continuing to utilize Polar Care   Rhonda Oconnell will use bone foam in 20-30 minute intervals  Rhonda Oconnell will wear TED hose bilaterally to help prevent DVT and clot formation   Discussed the Aquacel bandage.  This bandage will stay in place 7 days postoperatively.  Can be replaced with honeycomb bandages that will be sent home with the Rhonda Oconnell   Discussed sending the Rhonda Oconnell home with tramadol and oxycodone for as needed pain management.  Rhonda Oconnell will also be sent home with Celebrex to help with swelling and inflammation.  Rhonda Oconnell will take an 81 mg aspirin twice daily for DVT prophylaxis   JP drain removed without difficulty, intact   Weight-Bearing as tolerated to left leg   Rhonda Oconnell will follow-up with [facility] orthopedics in 2 weeks for staple removal and reevaluation  Diet - Regular diet Follow up - in 2 weeks Activity - WBAT Disposition - Home Condition Upon Discharge - Good DVT Prophylaxis - Aspirin and TED hose  Danise Edge, PA-C Orthopaedic Surgery 01/01/2024, 8:33 AM

## 2024-01-01 NOTE — Progress Notes (Signed)
DISCHARGE NOTE:  Pt given discharge instructions and scripts. Pt verbalized understanding. TED hose on both legs. Pt wheeled to car by staff. Walker sent with pt, husband providing transportation home.

## 2024-01-05 ENCOUNTER — Ambulatory Visit
Admission: RE | Admit: 2024-01-05 | Discharge: 2024-01-05 | Disposition: A | Discharge status: Home / Self Care | Payer: Medicare Other | Source: Ambulatory Visit | Attending: Orthopedic Surgery | Admitting: Orthopedic Surgery

## 2024-01-05 ENCOUNTER — Other Ambulatory Visit: Payer: Self-pay | Admitting: Orthopedic Surgery

## 2024-01-05 DIAGNOSIS — M7989 Other specified soft tissue disorders: Secondary | ICD-10-CM | POA: Diagnosis present

## 2024-01-23 ENCOUNTER — Other Ambulatory Visit: Payer: Self-pay | Admitting: Pulmonary Disease

## 2024-02-02 ENCOUNTER — Encounter: Payer: Self-pay | Admitting: Pulmonary Disease

## 2024-02-03 ENCOUNTER — Telehealth: Payer: Self-pay | Admitting: Pulmonary Disease

## 2024-02-03 ENCOUNTER — Ambulatory Visit: Payer: Medicare Other | Admitting: Student in an Organized Health Care Education/Training Program

## 2024-02-03 MED ORDER — METHYLPREDNISOLONE 4 MG PO TBPK: ORAL_TABLET | ORAL | 0 refills | Status: DC

## 2024-02-03 MED ORDER — AMOXICILLIN-POT CLAVULANATE 875-125 MG PO TABS: 1.0000 | ORAL_TABLET | Freq: Two times a day (BID) | ORAL | 0 refills | Status: AC

## 2024-02-03 NOTE — Telephone Encounter (Signed)
 Patient reports symptom started on Sunday. Fever this morning was 100.8. patient does not have any COVID of Flu test at home. Has not tested for COVID. Is using Albuterol and Breztri, reports mild symptoms control. Patient reports she is just not feeling well.   Per Dr. Jayme Cloud, patient needs to go to PCP or urgent care to be tested for COVID and flu.   Patient advised as below. Appt for this afternoon will be canceled.

## 2024-02-03 NOTE — Telephone Encounter (Signed)
 Lm x1 for the patient.

## 2024-02-03 NOTE — Telephone Encounter (Signed)
 Patient took home Covid 19 and Flu test. Both are negative. Patient phone number is [phone].

## 2024-02-03 NOTE — Telephone Encounter (Signed)
 Pt voiced understanding nothing further needed

## 2024-02-03 NOTE — Telephone Encounter (Signed)
 See patient message from 02/02/2024.

## 2024-02-05 NOTE — Telephone Encounter (Signed)
 Noted.  Will take that into account when we see her next.

## 2024-03-25 ENCOUNTER — Ambulatory Visit: Payer: Medicare Other | Admitting: Pulmonary Disease

## 2024-04-01 ENCOUNTER — Other Ambulatory Visit: Payer: Self-pay | Admitting: Pulmonary Disease

## 2024-04-01 NOTE — Telephone Encounter (Signed)
 Pt has appt 04/13/24 with Dr. Viva Grise

## 2024-04-08 ENCOUNTER — Ambulatory Visit: Attending: Nurse Practitioner | Admitting: Nurse Practitioner

## 2024-04-08 ENCOUNTER — Encounter: Payer: Self-pay | Admitting: Nurse Practitioner

## 2024-04-08 VITALS — BP 120/80 | HR 76 | Ht 62.0 in | Wt 205.0 lb

## 2024-04-08 DIAGNOSIS — J449 Chronic obstructive pulmonary disease, unspecified: Secondary | ICD-10-CM | POA: Diagnosis present

## 2024-04-08 DIAGNOSIS — I10 Essential (primary) hypertension: Secondary | ICD-10-CM | POA: Insufficient documentation

## 2024-04-08 DIAGNOSIS — G4733 Obstructive sleep apnea (adult) (pediatric): Secondary | ICD-10-CM | POA: Diagnosis present

## 2024-04-08 DIAGNOSIS — I5032 Chronic diastolic (congestive) heart failure: Secondary | ICD-10-CM | POA: Diagnosis present

## 2024-04-08 DIAGNOSIS — R002 Palpitations: Secondary | ICD-10-CM | POA: Diagnosis present

## 2024-04-08 NOTE — Progress Notes (Signed)
 Office Visit    Patient Name: Rhonda Oconnell Oconnell Date of Encounter: 04/08/2024  Primary Care Provider:  Nestor Banter, MD Primary Cardiologist:  Rhonda Crisp, MD  Chief Complaint    73 y.o. female with a history of chronic HFpEF, hypertension, hyperlipidemia, obstructive lung disease (COPD/asthma overlap syndrome), anemia, GERD, and obesity who presents for follow-up of HFpEF and hypertension.  Past Medical History  Subjective   Past Medical History:  Diagnosis Date   Abdominal hernia    Adenoma of right adrenal gland    Allergic state    Anemia    Aortic atherosclerosis (HCC)    Arthritis    Asthma-COPD overlap syndrome (HCC)    Chest pain    a. 04/2013 MV: Nl EF, no isch/infarct ; b. 05/2020 MV: EF 55-65%, no isch/infarct.   Chronic heart failure with preserved ejection fraction (HFpEF) (HCC)    a. 03/2020 Echo: EF 55-60%, no rwma, GrII DD, mild Ao sclerosis; b. 02/2022 Echo: EF 60-65%, no rwma, GrI DD, nl RV fxn, Ao sclerosis w/o stenosis.   Complication of anesthesia    a.) delayed emergence; b.) postopertive hypotension   Dysphagia    Dyspnea on exertion    Family history of adverse reaction to anesthesia    a.) PONV in first degree (mother) and second degree (maternal aunt) relatives   Former smoker    GERD (gastroesophageal reflux disease)    Heart murmur    Hemorrhoids    History of bilateral cataract extraction    History of ITP 1983   Hyperlipidemia    Hypertension    Laryngopharyngeal reflux (LPR)    Morbid obesity (HCC)    OSA on CPAP    PONV (postoperative nausea and vomiting)    Pre-diabetes    Pulmonary nodules    Rectal tear    Rectocele    Skin cancer    Varicose veins of lower extremity    Past Surgical History:  Procedure Laterality Date   ABDOMINAL HYSTERECTOMY     ARTHOSCOPIC ROTAOR CUFF REPAIR Right    BASAL CELL CARCINOMA EXCISION  10/23/2022   BLADDER REPAIR     CATARACT EXTRACTION Bilateral    CHOLECYSTECTOMY     COLONOSCOPY WITH  PROPOFOL  N/A 07/22/2016   Procedure: COLONOSCOPY WITH PROPOFOL ;  Surgeon: Rhonda Oconnell Click, MD;  Location: [facility] ENDOSCOPY;  Service: Endoscopy;  Laterality: N/A;   ESOPHAGOGASTRODUODENOSCOPY (EGD) WITH PROPOFOL  N/A 07/22/2016   Procedure: ESOPHAGOGASTRODUODENOSCOPY (EGD) WITH PROPOFOL ;  Surgeon: Rhonda Oconnell Click, MD;  Location: [facility] ENDOSCOPY;  Service: Endoscopy;  Laterality: N/A;   HEMORRHOID SURGERY     HERNIA REPAIR     umbilical/ventral x3   KNEE ARTHROPLASTY Left 12/31/2023   Procedure: COMPUTER ASSISTED TOTAL KNEE ARTHROPLASTY;  Surgeon: Rhonda Oconnell Lame, MD;  Location: [facility] ORS;  Service: Orthopedics;  Laterality: Left;   SKIN CANCER EXCISION     basal cell carcinoma removed from forehead   SKIN CANCER EXCISION Right 09/23/2023   Right Shoulder   SPLENECTOMY     TUBAL LIGATION      Allergies  Allergies  Allergen Reactions   Iodinated Contrast Media Hives   Iodine Hives   Sulfa Antibiotics Hives   Nsaids Cough   Amlodipine  Swelling   Meloxicam Palpitations   Naproxen Cough      History of Present Illness      73 y.o. y/o female with the above complex past medical history including chronic HFpEF, hypertension, hyperlipidemia, obstructive lung disease (COPD/asthma overlap syndrome), anemia, GERD,  and obesity.  She was previously evaluated at [facility] clinic with stress testing in May 2014, which showed normal LV function and no evidence of ischemia or infarct.  She established care with Dr. Nolan Oconnell in early 2021 in the setting of dyspnea on exertion.  Echo showed an EF of 55 to 60% with grade 2 diastolic dysfunction and mild aortic sclerosis without stenosis.  Stress testing in July 2021, showed no evidence of ischemia or infarct and normal LV function.  Repeat echo in March 2023 in the setting of ongoing cough, sinus congestion, and laryngitis showed normal LV function with grade 1 diastolic dysfunction and aortic sclerosis.   In 2023, she was switched from losartan  to Benicar in  the setting of chronic cough however, Benicar resulted in fatigue and low blood pressures and she subsequently went back to losartan . In the setting of ongoing hypertension, amlodipine  was added however, this resulted in lower extremity swelling. We subsequently increase losartan  to 100 mg daily and increased HCTZ to 25 mg daily in January 2024. With this change, she has had significant improvement in blood pressures.   Since the Fall of 2024, [name] has lost ~ 40 lbs.  She had to lose 20 prior to her knee surgery in January and since then, has lost another 20 pounds.  She notes feeling significantly better continuing to lose weight.  She is now able to walk without significant leg pain, though she continues to have numbness along the left lateral leg.  She does not experience chest pain or dyspnea and denies palpitations, PND, orthopnea, dizziness, syncope, edema, or early satiety.  Whereas she used to have to use a rescue inhaler fairly frequently, she uses it very rarely at this point. Objective  Home Medications    Current Outpatient Medications  Medication Sig Dispense Refill   acetaminophen  (TYLENOL ) 500 MG tablet Take 1,000 mg by mouth every 6 (six) hours as needed for moderate pain (pain score 4-6).     albuterol  (VENTOLIN  HFA) 108 (90 Base) MCG/ACT inhaler INHALE 2 PUFFS INTO THE LUNGS EVERY 6 HOURS AS NEEDED FOR WHEEZING OR SHORTNESS OF BREATH 54 g 0   aspirin  81 MG chewable tablet Chew 1 tablet (81 mg total) by mouth 2 (two) times daily.     atorvastatin  (LIPITOR) 20 MG tablet Take 20 mg by mouth at bedtime.     azelastine  (ASTELIN ) 0.1 % nasal spray Place 2 sprays into both nostrils 2 (two) times daily. Use in each nostril as directed (Patient taking differently: Place 2 sprays into both nostrils 2 (two) times daily as needed for allergies. Use in each nostril as directed) 30 mL 2   Budeson-Glycopyrrol-Formoterol  (BREZTRI  AEROSPHERE) 160-9-4.8 MCG/ACT AERO Inhale 2 puffs into the lungs  in the morning and at bedtime. 3 each 3   celecoxib  (CELEBREX ) 200 MG capsule Take 1 capsule (200 mg total) by mouth 2 (two) times daily. 60 capsule 1   cetirizine  (ZYRTEC ) 10 MG tablet Take 1 tablet (10 mg total) by mouth daily. (Patient taking differently: Take 10 mg by mouth every morning.) 90 tablet 3   chlorpheniramine (CHLOR-TRIMETON) 4 MG tablet Take 4 mg by mouth at bedtime.     Collagen-Vitamin C (COLLAGEN PLUS VITAMIN C PO) Take 1 tablet by mouth daily.     diphenhydramine -acetaminophen  (TYLENOL  PM) 25-500 MG TABS tablet Take 2 tablets by mouth at bedtime.     famotidine (PEPCID) 40 MG tablet TAKE 1 TABLET BY MOUTH EVERYDAY AT BEDTIME 90 tablet 1  hydrochlorothiazide  (HYDRODIURIL ) 25 MG tablet Take 1 tablet (25 mg) by mouth once daily (Patient taking differently: Take 25 mg by mouth every morning. Take 1 tablet (25 mg) by mouth once daily) 90 tablet 3   losartan  (COZAAR ) 100 MG tablet Take 100 mg by mouth at bedtime.     methylPREDNISolone  (MEDROL  DOSEPAK) 4 MG TBPK tablet Take as directed in the package. 21 tablet 0   montelukast  (SINGULAIR ) 10 MG tablet Take 10 mg by mouth at bedtime.     Multiple Minerals-Vitamins (CAL MAG ZINC +D3 PO) Take 1 tablet by mouth daily.     Multiple Vitamin (MULTIVITAMIN) tablet Take 1 tablet by mouth daily.     NON FORMULARY CPAP at bedtime.     omeprazole  (PRILOSEC) 40 MG capsule TAKE 1 CAPSULE (40 MG TOTAL) BY MOUTH DAILY. 90 capsule 0   Spacer/Aero-Holding Chambers DEVI 1 each by Does not apply route daily. 1 each 0   oxyCODONE  (OXY IR/ROXICODONE ) 5 MG immediate release tablet Take 1 tablet (5 mg total) by mouth every 4 (four) hours as needed for moderate pain (pain score 4-6) (pain score 4-6). (Patient not taking: Reported on 04/08/2024) 30 tablet 0   traMADol  (ULTRAM ) 50 MG tablet Take 1-2 tablets (50-100 mg total) by mouth every 4 (four) hours as needed for moderate pain (pain score 4-6). (Patient not taking: Reported on 04/08/2024) 30 tablet 0   No  current facility-administered medications for this visit.     Physical Exam    VS:  BP 120/80 (BP Location: Left Arm, Patient Position: Sitting, Cuff Size: Normal)   Pulse 76   Ht 5\' 2"  (1.575 m)   Wt 205 lb (93 kg)   SpO2 97%   BMI 37.49 kg/m  , BMI Body mass index is 37.49 kg/m.       GEN: Well nourished, well developed, in no acute distress. HEENT: normal. Neck: Supple, no JVD, carotid bruits, or masses. Cardiac: RRR, no murmurs, rubs, or gallops. No clubbing, cyanosis, edema.  Radials 2+/PT 2+ and equal bilaterally.  Respiratory:  Respirations regular and unlabored, clear to auscultation bilaterally. GI: Soft, nontender, nondistended, BS + x 4. MS: no deformity or atrophy. Skin: warm and dry, no rash. Neuro:  Strength and sensation are intact. Psych: Normal affect.  Accessory Clinical Findings    ECG personally reviewed by me today - EKG Interpretation Date/Time:  Thursday Apr 08 2024 14:01:28 EDT Ventricular Rate:  76 PR Interval:  154 QRS Duration:  76 QT Interval:  380 QTC Calculation: 427 R Axis:   18  Text Interpretation: Normal sinus rhythm Normal ECG Confirmed by Laneta Pintos [phone]) on 04/08/2024 3:16:00 PM  - no acute changes.  Lab Results  Component Value Date   WBC 10.1 12/25/2023   HGB 13.6 12/25/2023   HCT 40.7 12/25/2023   MCV 92.9 12/25/2023   PLT 340 12/25/2023   Lab Results  Component Value Date   CREATININE 0.82 12/25/2023   BUN 12 12/25/2023   NA 140 12/25/2023   K 3.3 (L) 12/25/2023   CL 100 12/25/2023   CO2 27 12/25/2023   Lab Results  Component Value Date   ALT 25 12/25/2023   AST 30 12/25/2023   ALKPHOS 71 12/25/2023   BILITOT 0.6 12/25/2023   Labs dated January 20, 2024:  Hemoglobin A1c 6.0 Total cholesterol 182, triglycerides 108, HDL 48.3, LDL 112 Sodium 140, potassium 4.5, chloride 100, CO2 32.5, BUN 14, creatinine 0.9, glucose 91 Total protein 7.0, calcium  10.6, albumin  4.4 Total bilirubin 0.6, alkaline  phosphatase 80, AST 21, ALT 14    Assessment & Plan    1.  Primary hypertension: Blood pressure normal today at 120/80.  She has lost 40 pounds and is feeling much better.  We will need to keep this in mind with further weight loss as she may need some de-escalation of therapy in the future.  She remains on losartan  and HCTZ.  2.  Chronic heart failure with preserved ejection fraction: Normal LV function with grade 1 diastolic dysfunction by echo in 2023.  She has been doing well without significant dyspnea on exertion and is euvolemic on exam.  Heart rate and blood pressure stable.  No medication changes today.  3.  Hyperlipidemia: LDL in the February down to 112 from 119 in 2024.  She has lost at least 20 pounds since then.  She remains on atorvastatin  therapy.  She believes that she will have follow-up lipids when she sees primary care over the summer and is hopeful that numbers improve with further weight loss.  4.  Obstructive sleep apnea: Compliant with CPAP.  5.  Obstructive lung disease: Seems to be doing much better without requirement for rescue inhaler nearly as often as previous.  To auscultation on examination today.  6.  Palpitations: Quiescent.  7.  Disposition: Follow-up with Dr. Nolan Oconnell in 1 year or sooner if necessary.  Laneta Pintos, NP 04/08/2024, 3:17 PM

## 2024-04-08 NOTE — Patient Instructions (Signed)
 Medication Instructions:  Your Physician recommend you continue on your current medication as directed.    *If you need a refill on your cardiac medications before your next appointment, please call your pharmacy*  Lab Work: No labs ordered today  If you have labs (blood work) drawn today and your tests are completely normal, you will receive your results only by: MyChart Message (if you have MyChart) OR A paper copy in the mail If you have any lab test that is abnormal or we need to change your treatment, we will call you to review the results.  Testing/Procedures: No test ordered today   Follow-Up: At [facility], you and your health needs are our priority.  As part of our continuing mission to provide you with exceptional heart care, our providers are all part of one team.  This team includes your primary Cardiologist (physician) and Advanced Practice Providers or APPs (Physician Assistants and Nurse Practitioners) who all work together to provide you with the care you need, when you need it.  Your next appointment:   1 year(s)  Provider:   You may see Sammy Crisp, MD or one of the following Advanced Practice Providers on your designated Care Team:    We recommend signing up for the patient portal called "MyChart".  Sign up information is provided on this After Visit Summary.  MyChart is used to connect with patients for Virtual Visits (Telemedicine).  Patients are able to view lab/test results, encounter notes, upcoming appointments, etc.  Non-urgent messages can be sent to your provider as well.   To learn more about what you can do with MyChart, go to [email or web address].

## 2024-04-13 ENCOUNTER — Encounter: Payer: Self-pay | Admitting: Pulmonary Disease

## 2024-04-13 ENCOUNTER — Ambulatory Visit (INDEPENDENT_AMBULATORY_CARE_PROVIDER_SITE_OTHER): Admitting: Pulmonary Disease

## 2024-04-13 VITALS — BP 126/86 | HR 97 | Temp 97.6°F | Ht 62.0 in | Wt 204.4 lb

## 2024-04-13 DIAGNOSIS — G4733 Obstructive sleep apnea (adult) (pediatric): Secondary | ICD-10-CM

## 2024-04-13 DIAGNOSIS — J4489 Other specified chronic obstructive pulmonary disease: Secondary | ICD-10-CM

## 2024-04-13 DIAGNOSIS — E6609 Other obesity due to excess calories: Secondary | ICD-10-CM

## 2024-04-13 DIAGNOSIS — R053 Chronic cough: Secondary | ICD-10-CM

## 2024-04-13 DIAGNOSIS — E66812 Obesity, class 2: Secondary | ICD-10-CM | POA: Diagnosis not present

## 2024-04-13 DIAGNOSIS — Z6838 Body mass index (BMI) 38.0-38.9, adult: Secondary | ICD-10-CM

## 2024-04-13 NOTE — Patient Instructions (Signed)
 VISIT SUMMARY:  Today, we discussed the issues you have been experiencing with your CPAP machine following your knee replacement surgery. We reviewed your current symptoms, including discomfort and air leaks with your full face mask, and your seasonal allergy symptoms.  YOUR PLAN:  -OBSTRUCTIVE SLEEP APNEA: Obstructive sleep apnea is a condition where your airway becomes blocked during sleep, causing breathing pauses. We will switch your CPAP settings to an auto set range of 5 to 12 to improve comfort and consider switching to a Dreamwear full face mask for a better fit and seal. Continue using your current inhalers and other treatments as previously prescribed. Follow up with [facility] for mask fitting and adjustments as needed.  -ALLERGIC RHINITIS: Allergic rhinitis is an allergic reaction that causes sneezing, congestion, and a runny nose. Continue taking your allergy medications, Claritin  at night and Zyrtec  in the mornings, as needed for symptom control.  INSTRUCTIONS:  Will make adjustments to your CPAP. Continue using your current inhalers and other treatments as previously prescribed.  We will see you in follow-up in 2 months time call sooner should any new problems arise.

## 2024-04-13 NOTE — Progress Notes (Unsigned)
 Subjective:    Patient ID: Rhonda Oconnell, female    DOB: 1951-03-11, 73 y.o.   MRN: [id number]  Patient Care Team: Nestor Banter, MD as PCP - General (Family Medicine) End, Veryl Gottron, MD as PCP - Cardiology (Cardiology) Marc Senior, MD as Consulting Physician (Pulmonary Disease)  Chief Complaint  Patient presents with  . Follow-up    Cough.     BACKGROUND/INTERVAL:  HPI    Review of Systems A 10 point review of systems was performed and it is as noted above otherwise negative.   Patient Active Problem List   Diagnosis Date Noted  . History of total knee arthroplasty, left 12/31/2023  . Chronic cough 01/30/2022  . Acute exacerbation of COPD with asthma (HCC) 01/16/2022  . Acute rhinosinusitis 01/16/2022  . Acute tracheobronchitis 01/16/2022  . Dyspnea on exertion 04/29/2020  . Chronic heart failure with preserved ejection fraction (HFpEF) (HCC) 04/29/2020  . Laryngopharyngeal reflux (LPR) 01/04/2019  . Acute recurrent sinusitis 01/04/2019  . Morbid obesity (HCC) 01/02/2019  . Morbid obesity with BMI of 40.0-44.9, adult (HCC) 11/24/2018  . Obstructive lung disease (HCC) 09/24/2016  . Varicose veins of both lower extremities with pain 09/24/2016  . 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 Essential hypertension 04/14/2014  . Other and unspecified hyperlipidemia 04/14/2014  . Allergic rhinitis 04/14/2014  . Esophageal reflux 04/14/2014    Social History   Tobacco Use  . Smoking status: Former    Current packs/day: 0.00    Average packs/day: 2.0 packs/day for 25.0 years (50.0 ttl pk-yrs)    Types: Cigarettes    Start date: 08/15/1968    Quit date: 08/15/1993    Years since quitting: 30.6  . Smokeless tobacco: Never  . Tobacco comments:    Quit smoking 25 years ago. 2 packs a day at most   Substance Use Topics  . Alcohol use: No    Allergies  Allergen Reactions  . Iodinated Contrast Media Hives  . Iodine Hives  . Sulfa Antibiotics Hives  . Nsaids Cough  .  Amlodipine  Swelling  . Meloxicam Palpitations  . Naproxen Cough    Current Meds  Medication Sig  . acetaminophen  (TYLENOL ) 500 MG tablet Take 1,000 mg by mouth every 6 (six) hours as needed for moderate pain (pain score 4-6).  . albuterol  (VENTOLIN  HFA) 108 (90 Base) MCG/ACT inhaler INHALE 2 PUFFS INTO THE LUNGS EVERY 6 HOURS AS NEEDED FOR WHEEZING OR SHORTNESS OF BREATH  . aspirin  81 MG chewable tablet Chew 1 tablet (81 mg total) by mouth 2 (two) times daily.  . atorvastatin  (LIPITOR) 20 MG tablet Take 20 mg by mouth at bedtime.  . azelastine  (ASTELIN ) 0.1 % nasal spray Place 2 sprays into both nostrils 2 (two) times daily. Use in each nostril as directed (Patient taking differently: Place 2 sprays into both nostrils 2 (two) times daily as needed for allergies. Use in each nostril as directed)  . Budeson-Glycopyrrol-Formoterol  (BREZTRI  AEROSPHERE) 160-9-4.8 MCG/ACT AERO Inhale 2 puffs into the lungs in the morning and at bedtime.  . cetirizine  (ZYRTEC ) 10 MG tablet Take 1 tablet (10 mg total) by mouth daily. (Patient taking differently: Take 10 mg by mouth every morning.)  . chlorpheniramine (CHLOR-TRIMETON) 4 MG tablet Take 4 mg by mouth at bedtime.  . Collagen-Vitamin C (COLLAGEN PLUS VITAMIN C PO) Take 1 tablet by mouth daily.  . diphenhydramine -acetaminophen  (TYLENOL  PM) 25-500 MG TABS tablet Take 2 tablets by mouth at bedtime.  . famotidine (PEPCID) 40 MG tablet TAKE  1 TABLET BY MOUTH EVERYDAY AT BEDTIME  . hydrochlorothiazide  (HYDRODIURIL ) 25 MG tablet Take 1 tablet (25 mg) by mouth once daily (Patient taking differently: Take 25 mg by mouth every morning. Take 1 tablet (25 mg) by mouth once daily)  . losartan  (COZAAR ) 100 MG tablet Take 100 mg by mouth at bedtime.  . montelukast  (SINGULAIR ) 10 MG tablet Take 10 mg by mouth at bedtime.  . Multiple Minerals-Vitamins (CAL MAG ZINC +D3 PO) Take 1 tablet by mouth daily.  . Multiple Vitamin (MULTIVITAMIN) tablet Take 1 tablet by mouth daily.   . NON FORMULARY CPAP at bedtime.  . omeprazole  (PRILOSEC) 40 MG capsule TAKE 1 CAPSULE (40 MG TOTAL) BY MOUTH DAILY.  Aaron Aas Spacer/Aero-Holding Chambers DEVI 1 each by Does not apply route daily.    Immunization History  Administered Date(s) Administered  . Fluad Quad(high Dose 65+) 09/15/2019, 03/07/2022, 10/22/2022  . Influenza Inj Mdck Quad Pf 10/01/2018  . Influenza Split 10/31/2015  . Influenza-Unspecified 10/20/2017, 10/02/2020  . Pneumococcal Conjugate-13 08/29/2016  . Pneumococcal Polysaccharide-23 03/22/2011, 10/20/2017  . Zoster Recombinant(Shingrix) 01/08/2019, 05/05/2019        Objective:     BP 126/86 (BP Location: Left Arm, Patient Position: Sitting, Cuff Size: Normal)   Pulse 97   Temp 97.6 F (36.4 C) (Temporal)   Ht 5\' 2"  (1.575 m)   Wt 204 lb 6.4 oz (92.7 kg)   SpO2 96%   BMI 37.39 kg/m   SpO2: 96 %  GENERAL: Obese woman, no acute respiratory distress, fully ambulatory.  No conversational dyspnea.  HEAD: Normocephalic, atraumatic. EYES: Pupils equal, round, reactive to light.  No scleral icterus. MOUTH: Wears dentures.  Oral mucosa moist.  No thrush. NECK: Supple. No thyromegaly. Trachea midline. No JVD.  No adenopathy. PULMONARY: Good air entry bilaterally, symmetrical breath sounds.  No adventitious sounds. CARDIOVASCULAR: S1 and S2. Regular rate and rhythm.  Grade 2/6 systolic ejection murmur left sternal border. No change from prior.   GASTROINTESTINAL: Obese, benign. MUSCULOSKELETAL: No joint deformity, no clubbing, no edema. NEUROLOGIC: Fully ambulatory with no gait disturbance noted.  Awake, alert, speech fluent, no overt focal deficits. SKIN: Intact,warm,dry.  On limited exam no rashes. PSYCH: Normal mood and behavior.         Assessment & Plan:   No diagnosis found.  No orders of the defined types were placed in this encounter.   No orders of the defined types were placed in this encounter.      Advised if symptoms do not improve  or worsen, to please contact office for sooner follow up or seek emergency care.    I spent xxx minutes of dedicated to the care of this patient on the date of this encounter to include pre-visit review of records, face-to-face time with the patient discussing conditions above, post visit ordering of testing, clinical documentation with the electronic health record, making appropriate referrals as documented, and communicating necessary findings to members of the patients care team.     C. Chloe Counter, MD Advanced Bronchoscopy PCCM [facility]    *This note was generated using voice recognition software/Dragon and/or AI transcription program.  Despite best efforts to proofread, errors can occur which can change the meaning. Any transcriptional errors that result from this process are unintentional and may not be fully corrected at the time of dictation.

## 2024-04-14 ENCOUNTER — Encounter: Payer: Self-pay | Admitting: Pulmonary Disease

## 2024-05-07 ENCOUNTER — Encounter: Payer: Self-pay | Admitting: Pulmonary Disease

## 2024-05-07 DIAGNOSIS — J4489 Other specified chronic obstructive pulmonary disease: Secondary | ICD-10-CM

## 2024-05-10 MED ORDER — BREZTRI AEROSPHERE 160-9-4.8 MCG/ACT IN AERO: 2.0000 | INHALATION_SPRAY | Freq: Two times a day (BID) | RESPIRATORY_TRACT | 3 refills | Status: AC

## 2024-05-16 ENCOUNTER — Encounter: Payer: Self-pay | Admitting: Pulmonary Disease

## 2024-06-15 ENCOUNTER — Encounter: Payer: Self-pay | Admitting: Pulmonary Disease

## 2024-06-15 ENCOUNTER — Ambulatory Visit: Admitting: Pulmonary Disease

## 2024-06-15 VITALS — BP 122/76 | HR 71 | Temp 98.4°F | Ht 62.0 in | Wt 200.0 lb

## 2024-06-15 DIAGNOSIS — R053 Chronic cough: Secondary | ICD-10-CM | POA: Diagnosis not present

## 2024-06-15 DIAGNOSIS — J4489 Other specified chronic obstructive pulmonary disease: Secondary | ICD-10-CM | POA: Diagnosis not present

## 2024-06-15 DIAGNOSIS — G4733 Obstructive sleep apnea (adult) (pediatric): Secondary | ICD-10-CM

## 2024-06-15 DIAGNOSIS — R0602 Shortness of breath: Secondary | ICD-10-CM

## 2024-06-15 LAB — NITRIC OXIDE: Nitric Oxide: 14

## 2024-06-15 MED ORDER — METHYLPREDNISOLONE 4 MG PO TBPK: ORAL_TABLET | ORAL | 0 refills | Status: DC

## 2024-06-15 NOTE — Patient Instructions (Signed)
 VISIT SUMMARY:  During your visit, we discussed your ongoing issues with chronic sinus congestion, persistent cough, and concerns with your CPAP machine. We reviewed your current treatments and made some adjustments to help manage your symptoms more effectively.  YOUR PLAN:  - SUBACUTE COUGH: Your cough, especially at night, is likely due to ongoing sinus drainage. We will start you on a prednisone  taper to help reduce inflammation and manage your cough.  -CHRONIC SINUSITIS: Chronic sinusitis means you have persistent sinus congestion and drainage. We may need to refer you to an ENT specialist for further evaluation.  -MODERATE SLEEP APNEA: Moderate sleep apnea is a condition where your breathing stops and starts during sleep. We discussed the issues with your CPAP machine, including water buildup and valve popping. Please contact [facility] to address these issues, and continue using the full face mask if it is more comfortable for you.  INSTRUCTIONS:  Please follow up with [facility] regarding the CPAP machine issues. If your symptoms persist or worsen, we may need to refer you to an ENT specialist for further evaluation. Continue using the full face mask if it provides better results.

## 2024-06-15 NOTE — Progress Notes (Signed)
 Subjective:    Patient ID: Rhonda Oconnell, female    DOB: June 09, 1951, 73 y.o.   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  She also has obstructive sleep apnea and is on CPAP.  This is a scheduled visit.  She was last seen on 13 Apr 2024.   HPI Discussed the use of AI scribe software for clinical note transcription with the patient, who gave verbal consent to proceed.  History of Present Illness   Rhonda Oconnell is a 73 year old female with chronic sinus issues and sleep apnea who presents with persistent cough and CPAP machine concerns.  She experiences ongoing sinus congestion, particularly in the mornings, leading to chronic drainage and a persistent cough, especially at night. Previous treatments with prednisone  and antibiotics have been more effective than antibiotics alone.  She currently uses Breztri , which she finds more effective than Trelegy, yet she continues to experience significant nocturnal cough.  She has issues with her CPAP machine, noting water accumulation in the tank that causes a flap to pop and disrupt her sleep. Attempts to adjust temperature settings have been unsuccessful, and she has reverted to using a full face mask, which she finds more comfortable than a nasal mask. The machine is from 2021, and her sleep patterns changed after knee replacement surgery.  She has not notified the DME company of this issue.  She was encouraged to follow-up with a DME company so her equipment can be properly inspected and adjusted as needed.  No issues with reflux.  As noted she has chronic sinus issues.  She is followed  by ENT but she has not followed up in many months.  No recent fevers, chills or sweats.  No hemoptysis.  Sputum has no color.    Review of Systems A 10 point review of systems was performed and it is as noted above otherwise negative.   Patient Active Problem List   Diagnosis Date Noted   History of total knee arthroplasty, left 12/31/2023   Chronic cough 01/30/2022   Acute exacerbation of COPD with asthma (HCC) 01/16/2022   Acute rhinosinusitis 01/16/2022   Acute tracheobronchitis 01/16/2022   Dyspnea on exertion 04/29/2020   Chronic heart failure with preserved ejection fraction (HFpEF) (HCC) 04/29/2020   Laryngopharyngeal reflux (LPR) 01/04/2019   Acute recurrent sinusitis 01/04/2019   Morbid obesity (HCC) 01/02/2019   Morbid obesity with BMI of 40.0-44.9, adult (HCC) 11/24/2018   Obstructive lung disease (HCC) 09/24/2016   Varicose veins of both lower extremities with pain 09/24/2016   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 2 packs a day  at most   Substance Use Topics   Alcohol use: No    Allergies  Allergen Reactions   Iodinated Contrast Media Hives   Iodine Hives   Sulfa Antibiotics Hives   Nsaids Cough   Amlodipine  Swelling   Meloxicam Palpitations   Naproxen Cough    Current Meds  Medication Sig   acetaminophen  (TYLENOL ) 500 MG tablet Take 1,000 mg by mouth every 6 (six) hours as needed for moderate pain (pain score 4-6).   albuterol  (VENTOLIN  HFA) 108 (90 Base) MCG/ACT inhaler INHALE 2 PUFFS  INTO THE LUNGS EVERY 6 HOURS AS NEEDED FOR WHEEZING OR SHORTNESS OF BREATH   aspirin  81 MG chewable tablet Chew 1 tablet (81 mg total) by mouth 2 (two) times daily.   atorvastatin  (LIPITOR) 20 MG tablet Take 20 mg by mouth at bedtime.   azelastine  (ASTELIN ) 0.1 % nasal spray Place 2 sprays into both nostrils 2 (two) times daily. Use in each nostril as directed (Patient taking differently: Place 2 sprays into both nostrils 2 (two) times daily as needed for allergies. Use in each nostril as directed)   budesonide -glycopyrrolate -formoterol  (BREZTRI  AEROSPHERE) 160-9-4.8 MCG/ACT AERO inhaler Inhale 2 puffs into the lungs in the morning and at bedtime.   celecoxib  (CELEBREX ) 200 MG capsule Take 1 capsule (200 mg total) by mouth 2 (two) times daily.   cetirizine  (ZYRTEC ) 10 MG tablet Take 1 tablet (10 mg total) by mouth daily. (Patient taking differently: Take 10 mg by mouth every morning.)   chlorpheniramine (CHLOR-TRIMETON) 4 MG tablet Take 4 mg by mouth at bedtime.   Collagen-Vitamin C (COLLAGEN PLUS VITAMIN C PO) Take 1 tablet by mouth daily.   diphenhydramine -acetaminophen  (TYLENOL  PM) 25-500 MG TABS tablet Take 2 tablets by mouth at bedtime.   famotidine (PEPCID) 40 MG tablet TAKE 1 TABLET BY MOUTH EVERYDAY AT BEDTIME   hydrochlorothiazide  (HYDRODIURIL ) 25 MG tablet Take 1 tablet (25 mg) by mouth once daily (Patient taking differently: Take 25 mg by mouth every morning. Take 1 tablet (25 mg) by mouth once daily)   losartan  (COZAAR ) 100 MG tablet Take 100 mg by mouth at bedtime.   methylPREDNISolone  (MEDROL  DOSEPAK) 4 MG TBPK tablet Take as directed in the package.   montelukast  (SINGULAIR ) 10 MG tablet Take 10 mg by mouth at bedtime.   Multiple Minerals-Vitamins (CAL MAG ZINC +D3 PO) Take 1 tablet by mouth daily.   Multiple Vitamin (MULTIVITAMIN) tablet Take 1 tablet by mouth daily.   NON FORMULARY CPAP at bedtime.   omeprazole  (PRILOSEC) 40 MG capsule TAKE 1 CAPSULE (40 MG TOTAL) BY MOUTH DAILY.    Spacer/Aero-Holding Chambers DEVI 1 each by Does not apply route daily.    Immunization History  Administered Date(s) Administered   Fluad Quad(high Dose 65+) 09/15/2019, 03/07/2022, 10/22/2022   Influenza Inj Mdck Quad Pf 10/01/2018   Influenza Split 10/31/2015   Influenza-Unspecified 10/20/2017, 10/02/2020   Pneumococcal Conjugate-13 08/29/2016   Pneumococcal Polysaccharide-23 03/22/2011, 10/20/2017   Zoster Recombinant(Shingrix) 01/08/2019, 05/05/2019        Objective:     BP 122/76 (BP Location: Right Arm, Patient Position: Sitting, Cuff Size: Normal)   Pulse 71   Temp 98.4 F (36.9 C) (Oral)   Ht 5' 2 (1.575 m)   Wt 200 lb (90.7 kg)   SpO2 96%   BMI 36.58 kg/m   SpO2: 96 %  GENERAL: Obese woman, no acute respiratory distress, fully ambulatory.  No conversational dyspnea.  HEAD: Normocephalic, atraumatic. EYES: Pupils equal, round, reactive to light.  No scleral icterus. MOUTH: Wears dentures.  Oral mucosa moist.  No thrush. NECK: Supple. No thyromegaly. Trachea midline. No JVD.  No adenopathy. PULMONARY: Good air entry bilaterally, symmetrical breath sounds.  No adventitious sounds. CARDIOVASCULAR: S1 and S2. Regular rate and rhythm.  Grade 2/6 systolic ejection murmur left sternal border. No change from prior.   GASTROINTESTINAL: Obese, benign. MUSCULOSKELETAL: No joint deformity, no clubbing, no edema. NEUROLOGIC: Fully ambulatory with no gait disturbance noted.  Awake, alert, speech fluent, no overt focal deficits. SKIN: Intact,warm,dry.  On limited exam no rashes. PSYCH: Normal mood and behavior.    Lab Results  Component Value Date   NITRICOXIDE 14 06/15/2024  *No evidence of type II inflammation.   Assessment & Plan:     ICD-10-CM   1. SOB (shortness of breath)  R06.02 Nitric oxide     2. Asthma-COPD overlap syndrome (HCC)  J44.89     3. OSA on CPAP  G47.33     4. Chronic cough  R05.3       Orders Placed This Encounter  Procedures    Nitric oxide    Meds ordered this encounter  Medications   methylPREDNISolone  (MEDROL  DOSEPAK) 4 MG TBPK tablet    Sig: Take as directed in the package.    Dispense:  21 tablet    Refill:  0   Discussion:    Chronic cough Chronic cough primarily at night, likely related to chronic sinus drainage. Previous treatment with prednisone  and antibiotics has been effective. Current airway inflammation levels are acceptable. Allergens may be contributing to symptoms. - Prescribe prednisone  taper to manage inflammation and cough  Chronic sinusitis Persistent sinus congestion and drainage, leading to morning nasal discharge. ENT follow-up may be necessary for further evaluation.  Moderate sleep apnea Moderate sleep apnea with issues related to CPAP machine, including water buildup causing valve popping and sleep disruption. Recent change to a new mask type worsened water issue. Sleep patterns may have changed post-knee replacement. - Instruct to contact [facility] regarding CPAP machine valve issue and water buildup - Advise to use full face mask if it provides better results      Advised if symptoms do not improve or worsen, to please contact office for sooner follow up or seek emergency care.    I spent 33 minutes of dedicated to the care of this patient on the date of this encounter to include pre-visit review of records, face-to-face time with the patient discussing conditions above, post visit ordering of testing, clinical documentation with the electronic health record, making appropriate referrals as documented, and communicating necessary findings to members of the patients care team.     C. Leita Sanders, MD Advanced Bronchoscopy PCCM [facility]    *This note was generated using voice recognition software/Dragon and/or AI transcription program.  Despite best efforts to proofread, errors can occur which can change the meaning. Any transcriptional errors that result  from this process are unintentional and may not be fully corrected at the time of dictation.

## 2024-06-27 ENCOUNTER — Other Ambulatory Visit: Payer: Self-pay | Admitting: Pulmonary Disease

## 2024-08-23 ENCOUNTER — Encounter: Payer: Self-pay | Admitting: Pulmonary Disease

## 2024-08-23 ENCOUNTER — Ambulatory Visit: Admitting: Pulmonary Disease

## 2024-08-23 VITALS — BP 140/90 | HR 71 | Temp 97.9°F | Ht 62.0 in | Wt 200.8 lb

## 2024-08-23 DIAGNOSIS — I10 Essential (primary) hypertension: Secondary | ICD-10-CM | POA: Diagnosis not present

## 2024-08-23 DIAGNOSIS — G4733 Obstructive sleep apnea (adult) (pediatric): Secondary | ICD-10-CM

## 2024-08-23 DIAGNOSIS — Z87891 Personal history of nicotine dependence: Secondary | ICD-10-CM

## 2024-08-23 DIAGNOSIS — R053 Chronic cough: Secondary | ICD-10-CM | POA: Diagnosis not present

## 2024-08-23 DIAGNOSIS — K219 Gastro-esophageal reflux disease without esophagitis: Secondary | ICD-10-CM

## 2024-08-23 DIAGNOSIS — R0981 Nasal congestion: Secondary | ICD-10-CM

## 2024-08-23 DIAGNOSIS — E66812 Obesity, class 2: Secondary | ICD-10-CM

## 2024-08-23 DIAGNOSIS — J4489 Other specified chronic obstructive pulmonary disease: Secondary | ICD-10-CM

## 2024-08-23 NOTE — Patient Instructions (Signed)
 VISIT SUMMARY:  Rhonda Oconnell, a 73 year old female with sleep apnea and hypertension, came in for a follow-up regarding her CPAP use and medication management. She reported improvements in her CPAP therapy after adjustments to the pressure and humidity settings. She also discussed her chronic cough and nasal congestion, which persist despite treatment. Her hypertension is well-managed with losartan  and a diuretic, and her gastroesophageal reflux disease is controlled with Pepcid and omeprazole .  YOUR PLAN:  -OBSTRUCTIVE SLEEP APNEA: Obstructive sleep apnea is a condition where the airway becomes blocked during sleep, causing breathing pauses. You reported improvements with your CPAP therapy after adjustments to the pressure and humidity settings. Please continue using your CPAP with the current settings.  -CHRONIC COUGH AND NASAL CONGESTION: Chronic cough and nasal congestion can be caused by various factors, including allergies and infections. Despite negative allergen tests and no sinus infection, your symptoms persist. Continue using Nuvessa and saline nasal spray for relief.  -GASTROESOPHAGEAL REFLUX DISEASE: Gastroesophageal reflux disease (GERD) occurs when stomach acid frequently flows back into the tube connecting your mouth and stomach. Your current medications, Pepcid and omeprazole , are effectively managing your symptoms. Please continue with these medications.  -HYPERTENSION: Hypertension, or high blood pressure, is a condition where the force of the blood against your artery walls is too high. Your current medications, losartan  and a diuretic, are effective, but we discussed switching to Benicar due to its lower side effect profile. We will communicate this recommendation to your primary care provider.  INSTRUCTIONS:  Please continue with your current CPAP settings and medications for GERD. For hypertension, we recommend switching to Benicar, and we will communicate this to your primary  care provider. Follow up with your primary care provider to discuss this change and any further management of your chronic cough and nasal congestion.

## 2024-08-23 NOTE — Progress Notes (Signed)
 Subjective:    Patient ID: Rhonda Oconnell, female    DOB: 26-Jun-1951, 73 y.o.   MRN: [id number]  Patient Care Team: Diedra Lame, MD as PCP - General (Family Medicine) End, Lonni, MD as PCP - Cardiology (Cardiology) Tamea Dedra LITTIE, MD as Consulting Physician (Pulmonary Disease)  Chief Complaint  Patient presents with   Asthma    Cough with white phlegm.     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  She also has obstructive sleep apnea and is on CPAP.  This is a scheduled visit.  She was last seen on 15 June 2024.   HPI Discussed the use of AI scribe software for clinical note transcription with the patient, who gave verbal consent to proceed.  History of Present Illness   Rhonda Oconnell is a 73 year old female with sleep apnea and hypertension who presents for follow-up regarding CPAP use and medication management.  She has experienced improvement in her CPAP use since adjustments were made to the pressure and humidity settings. She no longer wakes up with excessive pressure or wetness from humidity. However, she has to remove the CPAP mask when she starts coughing.  She experiences chronic nasal congestion and cough, with 'milky mucousy' discharge, despite negative allergen tests and no sinus infection diagnosis. She uses azelastine  and saline nasal spray for relief.  She follows with ENT however does this only sporadically.  She did see them recently and no active infection was noted.  She is currently taking Breztri  for her asthmatic bronchitis, which she states is working well for the most part.  For hypertension, she takes losartan  at night and a diuretic in the morning, which she finds effective in controlling her blood pressure. She previously tried amlodipine  but experienced swelling.  She is also on Pepcid and omeprazole  for GERD issues.     CPAP compliance shows 97%  usage in over 30 days with 67% of days with usage of over 4 hours.  The remainder of the days limited due to her nasal congestion issues.  She is currently on AutoSet of 5 to 12 cm H2O with pressures ranging between 9 to 11 cm H2O.  Her AHI residual is 4.0  Review of Systems A 10 point review of systems was performed and it is as noted above otherwise negative.   Patient Active Problem List   Diagnosis Date Noted   History of total knee arthroplasty, left 12/31/2023   Chronic cough 01/30/2022   Acute exacerbation of COPD with asthma (HCC) 01/16/2022   Acute rhinosinusitis 01/16/2022   Acute tracheobronchitis 01/16/2022   Dyspnea on exertion 04/29/2020   Chronic heart failure with preserved ejection fraction (HFpEF) (HCC) 04/29/2020   Laryngopharyngeal reflux (LPR) 01/04/2019   Acute recurrent sinusitis 01/04/2019   Morbid obesity (HCC) 01/02/2019   Morbid obesity with BMI of 40.0-44.9, adult (HCC) 11/24/2018   Obstructive lung disease (HCC) 09/24/2016   Varicose veins of both lower extremities with pain 09/24/2016   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 2 packs a day at most   Substance Use Topics   Alcohol use: No    Allergies  Allergen Reactions   Iodinated Contrast Media Hives   Iodine Hives   Sulfa Antibiotics Hives   Nsaids Cough   Amlodipine  Swelling   Meloxicam Palpitations   Naproxen Cough    Current Meds  Medication Sig   acetaminophen  (TYLENOL ) 500 MG tablet Take 1,000 mg by mouth every 6 (six)  hours as needed for moderate pain (pain score 4-6).   albuterol  (VENTOLIN  HFA) 108 (90 Base) MCG/ACT inhaler INHALE 2 PUFFS INTO THE LUNGS EVERY 6 HOURS AS NEEDED FOR WHEEZING OR SHORTNESS OF BREATH   aspirin  81 MG chewable tablet Chew 1 tablet (81 mg total) by mouth 2 (two) times daily.   atorvastatin  (LIPITOR) 20 MG tablet Take 20 mg by mouth at bedtime.   azelastine  (ASTELIN ) 0.1 % nasal spray Place 2 sprays into both nostrils 2 (two) times daily. Use in each nostril as directed (Patient taking differently: Place 2 sprays into both nostrils 2 (two) times daily as needed for allergies. Use in each nostril as directed)   budesonide -glycopyrrolate -formoterol  (BREZTRI  AEROSPHERE) 160-9-4.8 MCG/ACT AERO inhaler Inhale 2 puffs into the lungs in the morning and at bedtime.   cetirizine  (ZYRTEC ) 10 MG tablet Take 1 tablet (10 mg total) by mouth daily. (Patient taking differently: Take 10 mg by mouth every morning.)   chlorpheniramine (CHLOR-TRIMETON) 4 MG tablet Take 4 mg by mouth at bedtime.   Collagen-Vitamin C (COLLAGEN PLUS VITAMIN C PO) Take 1 tablet by mouth daily.   diphenhydramine -acetaminophen  (TYLENOL  PM) 25-500 MG TABS tablet Take 2 tablets by mouth at bedtime.   famotidine (PEPCID) 40 MG tablet TAKE 1 TABLET BY MOUTH EVERYDAY AT BEDTIME   hydrochlorothiazide  (HYDRODIURIL ) 25 MG tablet Take 1 tablet (25 mg) by mouth once daily (Patient taking differently: Take 25 mg by mouth every morning. Take 1 tablet (25 mg) by mouth once daily)   losartan  (COZAAR ) 100 MG tablet Take 100 mg by mouth at bedtime.   montelukast  (SINGULAIR ) 10 MG tablet Take 10 mg by mouth at bedtime.   Multiple Minerals-Vitamins (CAL MAG ZINC +D3 PO) Take 1 tablet by mouth daily.   Multiple Vitamin (MULTIVITAMIN) tablet Take 1 tablet by mouth daily.   NON FORMULARY CPAP at bedtime.   omeprazole  (PRILOSEC) 40 MG capsule TAKE 1 CAPSULE (40 MG TOTAL) BY MOUTH DAILY.   Spacer/Aero-Holding Chambers DEVI 1 each by Does not apply  route daily.    Immunization History  Administered Date(s) Administered   Fluad Quad(high Dose 65+) 09/15/2019, 03/07/2022, 10/22/2022   Influenza Inj Mdck Quad Pf 10/01/2018   Influenza Split 10/31/2015   Influenza-Unspecified 10/20/2017, 10/02/2020   Pneumococcal Conjugate-13 08/29/2016   Pneumococcal Polysaccharide-23 03/22/2011, 10/20/2017   Zoster Recombinant(Shingrix) 01/08/2019, 05/05/2019        Objective:     BP (!) 140/90   Pulse 71   Temp 97.9 F (36.6 C) (Temporal)   Ht 5' 2 (1.575 m)   Wt 200 lb 12.8 oz (91.1 kg)   SpO2 96%   BMI 36.73 kg/m   SpO2: 96 %  GENERAL: Obese woman, no acute respiratory distress, fully ambulatory.  No conversational dyspnea.  HEAD: Normocephalic, atraumatic. EYES: Pupils equal, round, reactive to light.  No scleral icterus. MOUTH: Wears dentures.  Oral mucosa moist.  No thrush. NECK: Supple. No thyromegaly. Trachea midline. No JVD.  No adenopathy. PULMONARY: Good air entry bilaterally, symmetrical breath sounds.  No adventitious sounds. CARDIOVASCULAR: S1 and S2. Regular rate and rhythm.  Grade 2/6 systolic ejection murmur left sternal border. No change from prior.   GASTROINTESTINAL: Obese, benign. MUSCULOSKELETAL: No joint deformity, no clubbing, no edema. NEUROLOGIC: Fully ambulatory with no gait disturbance noted.  Awake, alert, speech fluent, no overt focal deficits. SKIN: Intact,warm,dry.  On limited exam no rashes. PSYCH: Normal mood and behavior.    Assessment & Plan:     ICD-10-CM   1. Asthma-COPD overlap syndrome (HCC)  J44.89     2. OSA on CPAP  G47.33     3. Chronic cough  R05.3     4. Class 2 obesity due to excess calories without serious comorbidity with body mass index (BMI) of 38.0 to 38.9 in adult  E66.812    E66.09    Z68.38      Discussion:    Obstructive sleep apnea Obstructive sleep apnea managed with CPAP. Reports improvement with CPAP after pressure adjustment and reduction of humidity in  the tube. No longer experiences excessive pressure or waking up with excessive moisture. - Continue CPAP therapy with current settings.  Chronic cough and nasal congestion Chronic cough and nasal congestion with persistent milky mucous. ENT evaluation did not reveal sinus infection. Allergens testing was negative. Symptoms persist despite use of azelastine  and saline nasal spray.  Gastroesophageal reflux disease Gastroesophageal reflux disease managed with Pepcid and omeprazole . Current regimen appears to be effective. - Continue current medications: Pepcid and omeprazole .  Hypertension Hypertension currently managed with losartan  and a diuretic. Experiences swelling with amlodipine . Discussed potential switch to Benicar due to its lower side effect profile among ARB's, particularly less potential for cough. Will communicate recommendation to primary care provider. - Consider switching to Benicar for hypertension management. - Communicate recommendation to primary care provider.       Advised if symptoms do not improve or worsen, to please contact office for sooner follow up or seek emergency care.    I spent 40 minutes of dedicated to the care of this patient on the date of this encounter to include pre-visit review of records, face-to-face time with the patient discussing conditions above, post visit ordering of testing, clinical documentation with the electronic health record, making appropriate referrals as documented, and communicating necessary findings to members of the patients care team.     C. Leita Sanders, MD Advanced Bronchoscopy PCCM [facility]    *This note was generated using voice recognition software/Dragon and/or AI transcription program.  Despite best efforts to proofread, errors can occur which can change the meaning. Any transcriptional errors that result from this process are unintentional and may not be fully corrected at the time of  dictation.

## 2024-11-23 ENCOUNTER — Encounter: Payer: Self-pay | Admitting: Pulmonary Disease

## 2024-11-23 ENCOUNTER — Ambulatory Visit: Admitting: Pulmonary Disease

## 2024-11-23 VITALS — BP 132/86 | HR 72 | Temp 97.7°F | Ht 62.0 in | Wt 211.0 lb

## 2024-11-23 DIAGNOSIS — E66812 Obesity, class 2: Secondary | ICD-10-CM

## 2024-11-23 DIAGNOSIS — J4489 Other specified chronic obstructive pulmonary disease: Secondary | ICD-10-CM

## 2024-11-23 DIAGNOSIS — J329 Chronic sinusitis, unspecified: Secondary | ICD-10-CM

## 2024-11-23 DIAGNOSIS — G4733 Obstructive sleep apnea (adult) (pediatric): Secondary | ICD-10-CM

## 2024-11-23 NOTE — Progress Notes (Unsigned)
 Subjective:    Patient ID: Rhonda Oconnell, female    DOB: 1951/05/20, 73 y.o.   MRN: [id number]  Patient Care Team: Diedra Lame, MD as PCP - General (Family Medicine) End, Lonni, MD as PCP - Cardiology (Cardiology) Tamea Dedra LITTIE, MD as Consulting Physician (Pulmonary Disease)  Chief Complaint  Patient presents with   COPD    Cough with occasional white phlegm. Occasional shortness of breath and wheezing.    Obstructive Sleep Apnea    No problems with mask or pressure.     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  She also has obstructive sleep apnea and is on CPAP.  This is a scheduled visit.  She was last seen on 23 August 2024.   HPI Discussed the use of AI scribe software for clinical note transcription with the patient, who gave verbal consent to proceed.  History of Present Illness   Rhonda Oconnell is a 73 year old female with COPD, asthmatic bronchitis, and obstructive sleep apnea who presents for follow-up.  She is doing well with her CPAP therapy, although she recently changed to a new mask which is leaking. She is working on adjusting it to fit her face properly. She uses a full face mask that goes over the nose and has tried using nose pads to prevent it from wearing a ridge in her nose. She has not tried a regular nasal mask without a mouthpiece.  Her breathing has been overall good lately. She continues to use Breztri  and is satisfied with it, especially since she is receiving it for free.  No recent need for albuterol  rescue inhaler.  She is planning to change her insurance from a supplement to [facility] due to cost concerns, as her current plan is becoming too expensive.     Her compliance download for the last 30 days shows 100% compliance with average use per night of 8 hours 11 minutes.  She is on AutoSet at 5 to 12 cm H2O with an EPR of 3.  Pressures  range from 9.9 through 12 cm H2O.  No major leaks are noted.  Residual AHI is 2.6.  Overall good control.  Review of Systems A 10 point review of systems was performed and it is as noted above otherwise negative.   Patient Active Problem List   Diagnosis Date Noted   History of total knee arthroplasty, left 12/31/2023   Chronic cough 01/30/2022   Acute exacerbation of COPD with asthma (HCC) 01/16/2022   Acute rhinosinusitis 01/16/2022   Acute tracheobronchitis 01/16/2022   Dyspnea on exertion 04/29/2020   Chronic heart failure with preserved ejection fraction (HFpEF) (HCC) 04/29/2020   Laryngopharyngeal reflux (LPR) 01/04/2019   Acute recurrent sinusitis 01/04/2019   Morbid obesity (HCC) 01/02/2019   Morbid obesity with BMI of 40.0-44.9, adult (HCC) 11/24/2018   Obstructive lung disease (HCC) 09/24/2016   Varicose veins of both lower extremities with pain 09/24/2016   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 2 packs a day at most   Substance Use Topics   Alcohol use: No    Allergies[1]  Active Medications[2]  Immunization History  Administered Date(s) Administered   Fluad Quad(high Dose 65+) 09/15/2019, 03/07/2022, 10/22/2022   Influenza Inj Mdck Quad Pf 10/01/2018   Influenza Split 10/31/2015   Influenza-Unspecified 10/20/2017, 10/02/2020   Pneumococcal Conjugate-13 08/29/2016   Pneumococcal Polysaccharide-23 03/22/2011, 10/20/2017   Zoster Recombinant(Shingrix) 01/08/2019, 05/05/2019        Objective:      Vitals:   11/23/24 1456  BP: 132/86  Pulse: 72  Temp: 97.7 F (36.5 C)  Height: 5' 2 (1.575 m)  Weight: 211 lb (95.7 kg)  SpO2: 97%  TempSrc: Temporal  BMI (Calculated): 38.58     SpO2: 97 %  GENERAL: Obese woman, no acute respiratory distress, fully ambulatory.  No conversational dyspnea.  HEAD: Normocephalic, atraumatic. EYES: Pupils equal, round, reactive to light.  No scleral icterus. MOUTH: Wears dentures.  Oral mucosa moist.  No thrush. NECK: Supple. No thyromegaly. Trachea midline. No JVD.  No adenopathy. PULMONARY: Good air entry bilaterally, symmetrical breath sounds.  No adventitious sounds. CARDIOVASCULAR: S1 and S2. Regular rate and rhythm.  Grade 2/6 systolic ejection murmur left sternal border. No change from prior.   GASTROINTESTINAL: Obese, benign. MUSCULOSKELETAL: No joint deformity, no clubbing, no edema. NEUROLOGIC: No overt focal deficit, no gait disturbance, speech is fluent. SKIN: Intact,warm,dry.  On limited exam no rashes. PSYCH: Normal mood and behavior.  Assessment & Plan:     ICD-10-CM   1. 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 Class 2 obesity due to excess calories without serious comorbidity with body mass index (BMI) of 38.0 to 38.9 in adult  E66.812    E66.09    Z68.38      Discussion:    COPD with asthmatic bronchitis, well-controlled symptoms Well-managed with Breztri . Breathing is good, and she reports improvement from a previous episode. She is receiving Breztri  for free and plans to continue as long as it is available. - Continue Breztri  as prescribed. - Continue as needed albuterol .  Obstructive sleep apnea Well-managed with CPAP therapy. She reports excellent compliance and good results, although there is some mask leakage. She is using a full face mask with nose pads to alleviate discomfort. She is considering  trying a regular mask without a mouthpiece but will continue with the current setup as it is effective. - Continue current CPAP therapy with full face mask and nose pads. - Will consider trying a regular mask without a mouthpiece if issues persist.     Follow-up in 4 months time.  Advised if symptoms do not improve or worsen, to please contact office for sooner follow up or seek emergency care.    I spent 30 minutes of dedicated to the care of this patient on the date of this encounter to include pre-visit review of records, face-to-face time with the patient discussing conditions above, post visit ordering of testing, clinical documentation with the electronic health record, making appropriate referrals as documented, and communicating necessary findings to members of the patients care team.     C. Leita Sanders, MD Advanced Bronchoscopy PCCM [facility]    *This note was generated using  voice recognition software/Dragon and/or AI transcription program.  Despite best efforts to proofread, errors can occur which can change the meaning. Any transcriptional errors that result from this process are unintentional and may not be fully corrected at the time of dictation.     [1]  Allergies Allergen Reactions   Iodinated Contrast Media Hives   Iodine Hives   Sulfa Antibiotics Hives   Nsaids Cough   Amlodipine  Swelling   Meloxicam Palpitations   Naproxen Cough  [2]  Current Meds  Medication Sig   acetaminophen  (TYLENOL ) 500 MG tablet Take 1,000 mg by mouth every 6 (six) hours as needed for moderate pain (pain score 4-6).   albuterol  (VENTOLIN  HFA) 108 (90 Base) MCG/ACT inhaler INHALE 2 PUFFS INTO THE LUNGS EVERY 6 HOURS AS NEEDED FOR WHEEZING OR SHORTNESS OF BREATH   aspirin  81 MG chewable tablet Chew 1 tablet (81 mg total) by mouth 2 (two) times daily. (Patient taking differently: Chew 81 mg by mouth at bedtime.)   atorvastatin  (LIPITOR) 20 MG tablet Take 20 mg by  mouth at bedtime.   azelastine  (ASTELIN ) 0.1 % nasal spray Place 2 sprays into both nostrils 2 (two) times daily. Use in each nostril as directed (Patient taking differently: Place 2 sprays into both nostrils 2 (two) times daily. prn)   budesonide -glycopyrrolate -formoterol  (BREZTRI  AEROSPHERE) 160-9-4.8 MCG/ACT AERO inhaler Inhale 2 puffs into the lungs in the morning and at bedtime.   cetirizine  (ZYRTEC ) 10 MG tablet Take 1 tablet (10 mg total) by mouth daily.   chlorpheniramine (CHLOR-TRIMETON) 4 MG tablet Take 4 mg by mouth at bedtime.   Collagen-Vitamin C (COLLAGEN PLUS VITAMIN C PO) Take 1 tablet by mouth daily.   diphenhydramine -acetaminophen  (TYLENOL  PM) 25-500 MG TABS tablet Take 2 tablets by mouth at bedtime.   famotidine (PEPCID) 40 MG tablet TAKE 1 TABLET BY MOUTH EVERYDAY AT BEDTIME   hydrochlorothiazide  (HYDRODIURIL ) 25 MG tablet Take 1 tablet (25 mg) by mouth once daily   losartan  (COZAAR ) 100 MG tablet Take 100 mg by mouth at bedtime.   montelukast  (SINGULAIR ) 10 MG tablet Take 10 mg by mouth at bedtime.   Multiple Minerals-Vitamins (CAL MAG ZINC +D3 PO) Take 1 tablet by mouth daily.   Multiple Vitamin (MULTIVITAMIN) tablet Take 1 tablet by mouth daily.   NON FORMULARY CPAP at bedtime.   omeprazole  (PRILOSEC) 40 MG capsule TAKE 1 CAPSULE (40 MG TOTAL) BY MOUTH DAILY.   Spacer/Aero-Holding Chambers DEVI 1 each by Does not apply route daily.   traZODone (DESYREL) 50 MG tablet Take 50 mg by mouth at bedtime. (Patient taking differently: Take 50 mg by mouth at bedtime as needed for sleep.)

## 2024-11-23 NOTE — Patient Instructions (Signed)
 VISIT SUMMARY:  Rhonda Oconnell, a 73 year old female with COPD, asthmatic bronchitis, and obstructive sleep apnea, came in for a follow-up visit. She is doing well with her current treatments but is experiencing some issues with her CPAP mask. She is also planning to change her insurance due to cost concerns.  YOUR PLAN:  -COPD WITH ASTHMATIC BRONCHITIS: Chronic Obstructive Pulmonary Disease (COPD) with asthmatic bronchitis is a lung condition that makes it hard to breathe. You are managing well with Breztri , and your breathing has been good. Continue taking Breztri  as prescribed.  -OBSTRUCTIVE SLEEP APNEA: Obstructive sleep apnea is a condition where your breathing stops and starts during sleep. You are managing well with CPAP therapy, although you are experiencing some mask leakage. Continue using your current CPAP setup with the full face mask and nose pads. If the leakage continues to be a problem, you might consider trying a regular mask without a mouthpiece.  INSTRUCTIONS:  Please continue with your current treatments as discussed. If you experience any new issues or if the mask leakage persists, consider trying a different mask. Also, follow up with your insurance change to [employer] as planned.

## 2024-11-24 ENCOUNTER — Encounter: Payer: Self-pay | Admitting: Pulmonary Disease

## 2024-12-07 ENCOUNTER — Telehealth: Admitting: Emergency Medicine

## 2024-12-07 ENCOUNTER — Ambulatory Visit: Payer: Self-pay

## 2024-12-07 DIAGNOSIS — B9789 Other viral agents as the cause of diseases classified elsewhere: Secondary | ICD-10-CM

## 2024-12-07 DIAGNOSIS — J019 Acute sinusitis, unspecified: Secondary | ICD-10-CM | POA: Diagnosis not present

## 2024-12-07 NOTE — Telephone Encounter (Signed)
 Patient completed Telehealth visit for complaint since this encounter was initiated.

## 2024-12-07 NOTE — Progress Notes (Signed)
 We are sorry that you are not feeling well.  Here is how we plan to help!  Based on what you have shared with me it looks like you have sinusitis.  Sinusitis is inflammation and infection in the sinus cavities of the head.  Based on your presentation I believe you most likely have Acute Viral Sinusitis.This is an infection most likely caused by a virus. There is not specific treatment for viral sinusitis other than to help you with the symptoms until the infection runs its course.    Antibiotics are not recommended by the Infectious Disease Society of [place] unless you have severe symptoms (including high fever) or you have symptoms for more than 10 days. If you still have symptoms after 10 days, antibiotics should be considered.    If you feel your symptoms are severe, please be seen in person such as at an urgent care or by your primary care provider.   You may use plain Mucinex. Saline nasal spray help and can safely be used as often as needed for congestion. Try using saline irrigation, such as with a neti pot, several times a day while you are sick. Many neti pots come with salt packets premeasured to use to make saline. If you use your own salt, make sure it is kosher salt or sea salt (don't use table salt as it has iodine in it and you don't need that in your nose). Use distilled water to make saline. If you mix your own saline using your own salt, the recipe is 1/4 teaspoon salt in 1 cup warm water. Using saline irrigation can help prevent and treat sinus infections.   Continue using your azelastine  nasal spray as well.   Some authorities believe that zinc sprays or the use of Echinacea may shorten the course of your symptoms.  Sinus infections are not as easily transmitted as other respiratory infection, however we still recommend that you avoid close contact with loved ones, especially the very young and elderly.  Remember to wash your hands thoroughly throughout the day as this is the  number one way to prevent the spread of infection!  Home Care: Only take medications as instructed by your medical team. Do not take these medications with alcohol. A steam or ultrasonic humidifier can help congestion.  You can place a towel over your head and breathe in the steam from hot water coming from a faucet. Avoid close contacts especially the very young and the elderly. Cover your mouth when you cough or sneeze. Always remember to wash your hands.  Get Help Right Away If: You develop worsening fever or sinus pain. You develop a severe head ache or visual changes. Your symptoms persist after you have completed your treatment plan.  Make sure you Understand these instructions. Will watch your condition. Will get help right away if you are not doing well or get worse.  Your e-visit answers were reviewed by a board certified advanced clinical practitioner to complete your personal care plan.  Depending on the condition, your plan could have included both over the counter or prescription medications.  If there is a problem please reply  once you have received a response from your provider.  Your safety is important to us .  If you have drug allergies check your prescription carefully.    You can use MyChart to ask questions about todays visit, request a non-urgent call back, or ask for a work or school excuse for 24 hours related to this e-Visit.  If it has been greater than 24 hours you will need to follow up with your provider, or enter a new e-Visit to address those concerns.  You will get an e-mail in the next two days asking about your experience.  I hope that your e-visit has been valuable and will speed your recovery. Thank you for using e-visits.  I have spent 5 minutes in review of e-visit questionnaire, review and updating patient chart, medical decision making and response to patient.   Jon Belt, PhD, FNP-BC

## 2025-03-24 ENCOUNTER — Ambulatory Visit: Admitting: Pulmonary Disease
